# Patient Record
Sex: Female | Born: 1950 | Race: White | Hispanic: No | Marital: Married | Smoking: Never smoker
Health system: Southern US, Community
[De-identification: ages and names within clinical notes are randomized; demographics above are authoritative.]

## PROBLEM LIST (undated history)

## (undated) DIAGNOSIS — Z9889 Other specified postprocedural states: Secondary | ICD-10-CM

## (undated) DIAGNOSIS — R112 Nausea with vomiting, unspecified: Secondary | ICD-10-CM

## (undated) DIAGNOSIS — J40 Bronchitis, not specified as acute or chronic: Secondary | ICD-10-CM

## (undated) DIAGNOSIS — J329 Chronic sinusitis, unspecified: Secondary | ICD-10-CM

## (undated) DIAGNOSIS — R0602 Shortness of breath: Secondary | ICD-10-CM

## (undated) DIAGNOSIS — J189 Pneumonia, unspecified organism: Secondary | ICD-10-CM

## (undated) DIAGNOSIS — R42 Dizziness and giddiness: Secondary | ICD-10-CM

## (undated) DIAGNOSIS — Z789 Other specified health status: Secondary | ICD-10-CM

## (undated) DIAGNOSIS — H409 Unspecified glaucoma: Secondary | ICD-10-CM

## (undated) DIAGNOSIS — C50919 Malignant neoplasm of unspecified site of unspecified female breast: Principal | ICD-10-CM

## (undated) DIAGNOSIS — Z923 Personal history of irradiation: Secondary | ICD-10-CM

## (undated) DIAGNOSIS — R51 Headache: Secondary | ICD-10-CM

## (undated) DIAGNOSIS — K219 Gastro-esophageal reflux disease without esophagitis: Secondary | ICD-10-CM

## (undated) HISTORY — DX: Other specified health status: Z78.9

## (undated) HISTORY — PX: OTHER SURGICAL HISTORY: SHX169

## (undated) HISTORY — DX: Malignant neoplasm of unspecified site of unspecified female breast: C50.919

## (undated) HISTORY — DX: Chronic sinusitis, unspecified: J32.9

## (undated) HISTORY — PX: EYE SURGERY: SHX253

---

## 1997-09-05 ENCOUNTER — Observation Stay (HOSPITAL_COMMUNITY): Admission: RE | Admit: 1997-09-05 | Discharge: 1997-09-06 | Payer: Self-pay | Admitting: *Deleted

## 1998-07-30 ENCOUNTER — Other Ambulatory Visit: Admission: RE | Admit: 1998-07-30 | Discharge: 1998-07-30 | Payer: Self-pay | Admitting: *Deleted

## 1999-05-09 ENCOUNTER — Encounter: Payer: Self-pay | Admitting: Urology

## 1999-05-09 ENCOUNTER — Encounter: Admission: RE | Admit: 1999-05-09 | Discharge: 1999-05-09 | Payer: Self-pay | Admitting: Urology

## 1999-05-19 HISTORY — PX: OOPHORECTOMY: SHX86

## 1999-08-14 ENCOUNTER — Other Ambulatory Visit: Admission: RE | Admit: 1999-08-14 | Discharge: 1999-08-14 | Payer: Self-pay | Admitting: *Deleted

## 1999-09-17 ENCOUNTER — Encounter: Admission: RE | Admit: 1999-09-17 | Discharge: 1999-09-17 | Payer: Self-pay | Admitting: *Deleted

## 1999-09-17 ENCOUNTER — Encounter: Payer: Self-pay | Admitting: *Deleted

## 1999-09-26 ENCOUNTER — Encounter: Payer: Self-pay | Admitting: *Deleted

## 1999-09-26 ENCOUNTER — Encounter: Admission: RE | Admit: 1999-09-26 | Discharge: 1999-09-26 | Payer: Self-pay | Admitting: *Deleted

## 2000-11-02 ENCOUNTER — Other Ambulatory Visit: Admission: RE | Admit: 2000-11-02 | Discharge: 2000-11-02 | Payer: Self-pay | Admitting: Obstetrics and Gynecology

## 2001-04-28 ENCOUNTER — Encounter: Payer: Self-pay | Admitting: Obstetrics and Gynecology

## 2001-04-28 ENCOUNTER — Encounter: Admission: RE | Admit: 2001-04-28 | Discharge: 2001-04-28 | Payer: Self-pay | Admitting: Obstetrics and Gynecology

## 2002-01-10 ENCOUNTER — Other Ambulatory Visit: Admission: RE | Admit: 2002-01-10 | Discharge: 2002-01-10 | Payer: Self-pay | Admitting: Obstetrics and Gynecology

## 2003-03-28 ENCOUNTER — Encounter: Admission: RE | Admit: 2003-03-28 | Discharge: 2003-03-28 | Payer: Self-pay | Admitting: Obstetrics and Gynecology

## 2003-04-24 ENCOUNTER — Other Ambulatory Visit: Admission: RE | Admit: 2003-04-24 | Discharge: 2003-04-24 | Payer: Self-pay | Admitting: Obstetrics and Gynecology

## 2005-04-15 ENCOUNTER — Encounter: Admission: RE | Admit: 2005-04-15 | Discharge: 2005-04-15 | Payer: Self-pay | Admitting: Obstetrics and Gynecology

## 2005-04-29 ENCOUNTER — Encounter: Admission: RE | Admit: 2005-04-29 | Discharge: 2005-04-29 | Payer: Self-pay | Admitting: Obstetrics and Gynecology

## 2006-06-30 ENCOUNTER — Encounter (INDEPENDENT_AMBULATORY_CARE_PROVIDER_SITE_OTHER): Payer: Self-pay | Admitting: Specialist

## 2006-06-30 ENCOUNTER — Ambulatory Visit (HOSPITAL_BASED_OUTPATIENT_CLINIC_OR_DEPARTMENT_OTHER): Admission: RE | Admit: 2006-06-30 | Discharge: 2006-06-30 | Payer: Self-pay | Admitting: Obstetrics and Gynecology

## 2006-12-17 HISTORY — PX: NOSE SURGERY: SHX723

## 2007-04-26 ENCOUNTER — Encounter: Admission: RE | Admit: 2007-04-26 | Discharge: 2007-04-26 | Payer: Self-pay | Admitting: Obstetrics and Gynecology

## 2008-04-16 ENCOUNTER — Encounter: Admission: RE | Admit: 2008-04-16 | Discharge: 2008-04-16 | Payer: Self-pay | Admitting: Family Medicine

## 2008-07-25 ENCOUNTER — Encounter: Admission: RE | Admit: 2008-07-25 | Discharge: 2008-07-25 | Payer: Self-pay | Admitting: Obstetrics and Gynecology

## 2009-04-08 ENCOUNTER — Emergency Department (HOSPITAL_BASED_OUTPATIENT_CLINIC_OR_DEPARTMENT_OTHER): Admission: EM | Admit: 2009-04-08 | Discharge: 2009-04-08 | Payer: Self-pay | Admitting: Emergency Medicine

## 2009-05-18 DIAGNOSIS — Z923 Personal history of irradiation: Secondary | ICD-10-CM

## 2009-05-18 HISTORY — DX: Personal history of irradiation: Z92.3

## 2009-08-13 ENCOUNTER — Encounter: Admission: RE | Admit: 2009-08-13 | Discharge: 2009-08-13 | Payer: Self-pay | Admitting: Obstetrics and Gynecology

## 2009-08-16 HISTORY — PX: BREAST LUMPECTOMY: SHX2

## 2009-08-20 ENCOUNTER — Encounter: Admission: RE | Admit: 2009-08-20 | Discharge: 2009-08-20 | Payer: Self-pay | Admitting: Obstetrics and Gynecology

## 2009-08-27 ENCOUNTER — Encounter: Admission: RE | Admit: 2009-08-27 | Discharge: 2009-08-27 | Payer: Self-pay | Admitting: Obstetrics and Gynecology

## 2009-09-05 ENCOUNTER — Encounter: Admission: RE | Admit: 2009-09-05 | Discharge: 2009-09-05 | Payer: Self-pay | Admitting: General Surgery

## 2009-09-10 ENCOUNTER — Ambulatory Visit (HOSPITAL_BASED_OUTPATIENT_CLINIC_OR_DEPARTMENT_OTHER): Admission: RE | Admit: 2009-09-10 | Discharge: 2009-09-10 | Payer: Self-pay | Admitting: General Surgery

## 2009-09-10 ENCOUNTER — Encounter: Admission: RE | Admit: 2009-09-10 | Discharge: 2009-09-10 | Payer: Self-pay | Admitting: General Surgery

## 2009-09-24 ENCOUNTER — Ambulatory Visit: Payer: Self-pay | Admitting: Oncology

## 2009-10-07 ENCOUNTER — Ambulatory Visit: Admission: RE | Admit: 2009-10-07 | Discharge: 2009-12-06 | Payer: Self-pay | Admitting: Radiation Oncology

## 2009-10-09 ENCOUNTER — Ambulatory Visit (HOSPITAL_COMMUNITY): Admission: RE | Admit: 2009-10-09 | Discharge: 2009-10-09 | Payer: Self-pay | Admitting: Oncology

## 2009-11-21 ENCOUNTER — Ambulatory Visit (HOSPITAL_COMMUNITY): Admission: RE | Admit: 2009-11-21 | Discharge: 2009-11-21 | Payer: Self-pay | Admitting: Oncology

## 2009-12-10 ENCOUNTER — Ambulatory Visit: Payer: Self-pay | Admitting: Oncology

## 2009-12-12 LAB — CBC WITH DIFFERENTIAL/PLATELET
BASO%: 0.8 % (ref 0.0–2.0)
Basophils Absolute: 0 10*3/uL (ref 0.0–0.1)
EOS%: 5.7 % (ref 0.0–7.0)
HGB: 14.2 g/dL (ref 11.6–15.9)
MCH: 31.9 pg (ref 25.1–34.0)
RDW: 12.4 % (ref 11.2–14.5)
lymph#: 0.6 10*3/uL — ABNORMAL LOW (ref 0.9–3.3)

## 2010-05-09 ENCOUNTER — Ambulatory Visit: Payer: Self-pay | Admitting: Oncology

## 2010-05-14 LAB — CBC WITH DIFFERENTIAL/PLATELET
Basophils Absolute: 0 10*3/uL (ref 0.0–0.1)
Eosinophils Absolute: 0.3 10*3/uL (ref 0.0–0.5)
HGB: 14.1 g/dL (ref 11.6–15.9)
MCV: 91.2 fL (ref 79.5–101.0)
MONO#: 0.4 10*3/uL (ref 0.1–0.9)
NEUT#: 2.7 10*3/uL (ref 1.5–6.5)
RDW: 12.7 % (ref 11.2–14.5)
WBC: 4.2 10*3/uL (ref 3.9–10.3)
lymph#: 0.8 10*3/uL — ABNORMAL LOW (ref 0.9–3.3)

## 2010-05-14 LAB — COMPREHENSIVE METABOLIC PANEL
Albumin: 4 g/dL (ref 3.5–5.2)
BUN: 13 mg/dL (ref 6–23)
CO2: 32 mEq/L (ref 19–32)
Calcium: 9.3 mg/dL (ref 8.4–10.5)
Chloride: 102 mEq/L (ref 96–112)
Glucose, Bld: 107 mg/dL — ABNORMAL HIGH (ref 70–99)
Potassium: 3.9 mEq/L (ref 3.5–5.3)
Total Protein: 6.7 g/dL (ref 6.0–8.3)

## 2010-06-30 ENCOUNTER — Other Ambulatory Visit: Payer: Self-pay | Admitting: Oncology

## 2010-06-30 DIAGNOSIS — Z1231 Encounter for screening mammogram for malignant neoplasm of breast: Secondary | ICD-10-CM

## 2010-06-30 DIAGNOSIS — Z9889 Other specified postprocedural states: Secondary | ICD-10-CM

## 2010-08-05 LAB — DIFFERENTIAL
Eosinophils Absolute: 0.1 10*3/uL (ref 0.0–0.7)
Eosinophils Relative: 3 % (ref 0–5)
Lymphocytes Relative: 22 % (ref 12–46)
Lymphs Abs: 0.9 10*3/uL (ref 0.7–4.0)
Monocytes Absolute: 0.3 10*3/uL (ref 0.1–1.0)

## 2010-08-05 LAB — COMPREHENSIVE METABOLIC PANEL
ALT: 51 U/L — ABNORMAL HIGH (ref 0–35)
AST: 31 U/L (ref 0–37)
Albumin: 4.1 g/dL (ref 3.5–5.2)
CO2: 28 mEq/L (ref 19–32)
Calcium: 8.8 mg/dL (ref 8.4–10.5)
Chloride: 100 mEq/L (ref 96–112)
GFR calc Af Amer: >60 mL/min (ref >60)
GFR calc non Af Amer: >60 mL/min (ref >60)
Sodium: 135 mEq/L (ref 135–145)

## 2010-08-05 LAB — CBC
MCHC: 35.2 g/dL (ref 30.0–36.0)
Platelets: 264 10*3/uL (ref 150–400)
RBC: 4.5 MIL/uL (ref 3.87–5.11)
WBC: 4.2 10*3/uL (ref 4.0–10.5)

## 2010-08-19 ENCOUNTER — Ambulatory Visit
Admission: RE | Admit: 2010-08-19 | Discharge: 2010-08-19 | Disposition: A | Discharge status: Home / Self Care | Source: Ambulatory Visit | Attending: Oncology | Admitting: Oncology

## 2010-08-19 DIAGNOSIS — Z9889 Other specified postprocedural states: Secondary | ICD-10-CM

## 2010-10-03 NOTE — Op Note (Signed)
[name], [name]                  ACCOUNT NO.:  [id number]   MEDICAL RECORD NO.:  [id number]          PATIENT TYPE:  AMB   LOCATION:  [facility]                         FACILITY:  [facility]   PHYSICIAN:  Sherry A. Dickstein, M.D.DATE OF BIRTH:  1950-10-24   DATE OF PROCEDURE:  06/30/2006  DATE OF DISCHARGE:                               OPERATIVE REPORT   PREOPERATIVE DIAGNOSIS:  Endometrial polyp.   POSTOPERATIVE DIAGNOSIS:  Endometrial polyp.   OPERATION/PROCEDURE:  Dilatation and curettage hysteroscopy with  resectoscope.   SURGEON:  Sherry A. Rosalio Macadamia, M.D.   ANESTHESIA:  MAC.   INDICATIONS:  This is a 60 year old, gravida 1, para 1-0-0-1 woman who  has had two episodes of postmenopausal bleeding.  The patient had an  ultrasound which revealed a thickened endometrium, then underwent a  sonohysterogram which showed an endometrium polyp. Because of the  presence of the endometrial polyp, the patient is here for [facility]  hysteroscopy with resectoscope.   FINDINGS:  Normal sized anteflexed uterus with a long endometrial polyp  present.   DESCRIPTION OF PROCEDURE:  The patient is brought into the operating  room and given adequate IV sedation.  She  placed in the dorsal  lithotomy position.  Her perineum was washed with Betadine.  Pelvic  examination was performed.  Surgeon's gown and gloves were changed.  The  patient was draped in a sterile fashion.  Speculum was placed in the  vagina.  Vagina was washed with Betadine.  Paracervical block was  administered with 1% Nesacaine.  Anterolateral was grasped with the  single-tooth tenaculum.  Cervix was sounded.  Cervix was dilated with  Pratt dilators to a #31.  Hysteroscope was introduced into the  endometrial cavity.  Pictures were obtained. Using double loop, right  angle resector, the endometrial polyp was removed.  The base of the  polyp was then resected and some endometrial tissue was resected as  well.  Adequate hemostasis was  present.  All instruments were removed  from the vagina.  The patient was taken out of the dorsal lithotomy  position.  Complications were none.   SPECIMENS:  Endometrial polyp with resections.   IV FLUIDS:  Sorbitol differential was minus 20 mL.      Sherry A. Rosalio Macadamia, M.D.  Electronically Signed     SAD/MEDQ  D:  06/30/2006  T:  06/30/2006  Job:  [id number]

## 2010-11-11 ENCOUNTER — Other Ambulatory Visit: Payer: Self-pay | Admitting: Oncology

## 2010-11-11 ENCOUNTER — Encounter (HOSPITAL_BASED_OUTPATIENT_CLINIC_OR_DEPARTMENT_OTHER): Admitting: Oncology

## 2010-11-11 DIAGNOSIS — C50419 Malignant neoplasm of upper-outer quadrant of unspecified female breast: Secondary | ICD-10-CM

## 2010-11-11 DIAGNOSIS — Z170 Estrogen receptor positive status [ER+]: Secondary | ICD-10-CM

## 2010-11-11 LAB — CBC WITH DIFFERENTIAL/PLATELET
BASO%: 0.7 % (ref 0.0–2.0)
Basophils Absolute: 0 10*3/uL (ref 0.0–0.1)
EOS%: 11.8 % — ABNORMAL HIGH (ref 0.0–7.0)
MCH: 31.6 pg (ref 25.1–34.0)
MCHC: 34.3 g/dL (ref 31.5–36.0)
MCV: 92.3 fL (ref 79.5–101.0)
MONO%: 7.2 % (ref 0.0–14.0)
RBC: 4.33 10*6/uL (ref 3.70–5.45)
RDW: 12.3 % (ref 11.2–14.5)
lymph#: 1 10*3/uL (ref 0.9–3.3)

## 2010-11-11 LAB — COMPREHENSIVE METABOLIC PANEL
ALT: 17 U/L (ref 0–35)
AST: 22 U/L (ref 0–37)
Albumin: 3.9 g/dL (ref 3.5–5.2)
Alkaline Phosphatase: 128 U/L — ABNORMAL HIGH (ref 39–117)
BUN: 13 mg/dL (ref 6–23)
Chloride: 98 mEq/L (ref 96–112)
Potassium: 3.7 mEq/L (ref 3.5–5.3)
Sodium: 138 mEq/L (ref 135–145)

## 2010-12-02 ENCOUNTER — Other Ambulatory Visit: Payer: Self-pay | Admitting: Pulmonary Disease

## 2010-12-02 ENCOUNTER — Ambulatory Visit
Admission: RE | Admit: 2010-12-02 | Discharge: 2010-12-02 | Disposition: A | Discharge status: Home / Self Care | Source: Ambulatory Visit | Attending: Pulmonary Disease | Admitting: Pulmonary Disease

## 2010-12-02 DIAGNOSIS — J449 Chronic obstructive pulmonary disease, unspecified: Secondary | ICD-10-CM

## 2011-03-05 ENCOUNTER — Encounter (HOSPITAL_BASED_OUTPATIENT_CLINIC_OR_DEPARTMENT_OTHER): Admitting: Oncology

## 2011-03-05 ENCOUNTER — Other Ambulatory Visit: Payer: Self-pay | Admitting: Oncology

## 2011-03-05 DIAGNOSIS — Z170 Estrogen receptor positive status [ER+]: Secondary | ICD-10-CM

## 2011-03-05 DIAGNOSIS — C50419 Malignant neoplasm of upper-outer quadrant of unspecified female breast: Secondary | ICD-10-CM

## 2011-03-05 LAB — CBC WITH DIFFERENTIAL/PLATELET
EOS%: 6.3 % (ref 0.0–7.0)
Eosinophils Absolute: 0.3 10*3/uL (ref 0.0–0.5)
MCV: 90.5 fL (ref 79.5–101.0)
MONO%: 10.1 % (ref 0.0–14.0)
NEUT#: 2.6 10*3/uL (ref 1.5–6.5)
RBC: 4.23 10*6/uL (ref 3.70–5.45)
RDW: 12.1 % (ref 11.2–14.5)

## 2011-03-05 LAB — COMPREHENSIVE METABOLIC PANEL
ALT: 21 U/L (ref 0–35)
AST: 27 U/L (ref 0–37)
Albumin: 4.3 g/dL (ref 3.5–5.2)
Alkaline Phosphatase: 120 U/L — ABNORMAL HIGH (ref 39–117)
Glucose, Bld: 110 mg/dL — ABNORMAL HIGH (ref 70–99)
Potassium: 3.7 mEq/L (ref 3.5–5.3)
Sodium: 139 mEq/L (ref 135–145)
Total Protein: 6.7 g/dL (ref 6.0–8.3)

## 2011-03-24 ENCOUNTER — Telehealth: Payer: Self-pay | Admitting: *Deleted

## 2011-03-24 ENCOUNTER — Ambulatory Visit (HOSPITAL_BASED_OUTPATIENT_CLINIC_OR_DEPARTMENT_OTHER): Admitting: Physician Assistant

## 2011-03-24 ENCOUNTER — Telehealth: Payer: Self-pay | Admitting: Physician Assistant

## 2011-03-24 ENCOUNTER — Other Ambulatory Visit: Payer: Self-pay | Admitting: Physician Assistant

## 2011-03-24 ENCOUNTER — Encounter: Payer: Self-pay | Admitting: Physician Assistant

## 2011-03-24 ENCOUNTER — Ambulatory Visit (HOSPITAL_COMMUNITY)
Admission: RE | Admit: 2011-03-24 | Discharge: 2011-03-24 | Disposition: A | Discharge status: Home / Self Care | Source: Ambulatory Visit | Attending: Physician Assistant | Admitting: Physician Assistant

## 2011-03-24 VITALS — BP 145/89 | HR 88 | Temp 97.8°F | Ht 62.5 in | Wt 158.8 lb

## 2011-03-24 DIAGNOSIS — C50919 Malignant neoplasm of unspecified site of unspecified female breast: Secondary | ICD-10-CM

## 2011-03-24 DIAGNOSIS — C50019 Malignant neoplasm of nipple and areola, unspecified female breast: Secondary | ICD-10-CM

## 2011-03-24 DIAGNOSIS — R0602 Shortness of breath: Secondary | ICD-10-CM | POA: Insufficient documentation

## 2011-03-24 DIAGNOSIS — Z853 Personal history of malignant neoplasm of breast: Secondary | ICD-10-CM | POA: Insufficient documentation

## 2011-03-24 DIAGNOSIS — R05 Cough: Secondary | ICD-10-CM | POA: Insufficient documentation

## 2011-03-24 DIAGNOSIS — Z170 Estrogen receptor positive status [ER+]: Secondary | ICD-10-CM

## 2011-03-24 DIAGNOSIS — J069 Acute upper respiratory infection, unspecified: Secondary | ICD-10-CM

## 2011-03-24 DIAGNOSIS — R059 Cough, unspecified: Secondary | ICD-10-CM | POA: Insufficient documentation

## 2011-03-24 HISTORY — DX: Malignant neoplasm of unspecified site of unspecified female breast: C50.919

## 2011-03-24 MED ORDER — MOXIFLOXACIN HCL 400 MG PO TABS: 400.0000 mg | ORAL_TABLET | Freq: Every day | ORAL | Status: AC

## 2011-03-24 MED ORDER — CLONIDINE HCL 0.1 MG PO TABS: 0.1000 mg | ORAL_TABLET | Freq: Every evening | ORAL | Status: DC | PRN

## 2011-03-24 MED ORDER — CLONIDINE HCL 0.1 MG PO TABS: 0.1000 mg | ORAL_TABLET | Freq: Two times a day (BID) | ORAL | Status: DC

## 2011-03-24 NOTE — Progress Notes (Signed)
Hematology and Oncology Follow Up Visit  [name] [id number] [birth year] 60 y.o. 03/24/2011 2:53 PM   Interim History:  Patient returns today for followup of her left breast carcinoma. Interval history is remarkable for patient having been diagnosed for a "bronchitis". She has not been treated with antibiotics but was placed on an inhaler by her PCP. She continues however to have increased shortness of breath and continues to have a cough productive of yellow phlegm. Fortunately she has had no fevers chills or night sweats but does have some mild fatigue. She denies any pleurisy he has had no hemoptysis. She does take Claritin as needed. She also utilizes Nasonex nose spray and of course her an inhaler as noted above.  Interim history is also remarkable for the patient having discontinued Effexor XR. She is now taking only clonidine at night for her hot flashes and finds this. Effective. She and her gynecologist, Dr. Cherly Duran, were both concerned about the patient's weight gain while on the Effexor. She has been tapering off of the Effexor very slowly and has tolerated the taper well. She is now taking Effexor every other day, and will complete the taper in the next week or 2. Again she continues to have hot flashes but finds that these have actually improved since discontinuing the Effexor and beginning on clonidine which she now takes regularly.  Otherwise a detailed review of systems is noncontributory and interval history is unremarkable. [name] continues on her tamoxifen at 20 mg daily which she tolerates well, with no evidence of abnormal clotting, vaginal dryness, discharge, or bleeding, and no change in vision. 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  Review of Systems: Constitutional:  no weight loss, fever, night sweats Eyes: negative  WUJ:WJXBJ congestion and nasal discharge Cardiovascular: no chest pain or dyspnea on exertion Respiratory: positive for - cough,  shortness of breath, sputum changes and wheezing Neurological: negative Dermatological: negative Gastrointestinal: no abdominal pain, change in bowel habits, or black or bloody stools Genito-Urinary: no dysuria, trouble voiding, or hematuria Hematological and Lymphatic: negative Breast: negative Musculoskeletal: joint pain, unchanged Remaining ROS negative.  Medications: I have reviewed the patient's current medications.  Allergies:  Allergies  Allergen Reactions  . Amoxicillin-Pot Clavulanate Nausea And Vomiting  . Doxycycline Nausea And Vomiting     Physical Exam: Blood pressure 145/89, pulse 88, temperature 97.8 F (36.6 C), height 5' 2.5" (1.588 m), weight 158 lb 12.8 oz (72.031 kg). HEENT:  Sclerae anicteric, conjunctivae pink.  Oropharynx clear.  No mucositis or candidiasis.  Nodes:  No cervical, supraclavicular, or axillary lymphadenopathy palpated.  Breast Exam:  Right breast is benign.  No masses, discharge, skin change, or nipple inversion.  Left breast is status post lumpectomy with no suspicious masses, discharge, skin change, or nipple inversion.  Lungs:  Diffuse expiratory wheezes. Otherwise clear to auscultation bilaterally.  No crackles or rhonchi.  Heart:  Regular rate and rhythm.  Abdomen:  Soft, nontender.  Positive bowel sounds.  No organomegaly or masses palpated.  Musculoskeletal:  No focal spinal tenderness to palpation.  Extremities:  Benign.  No peripheral edema or cyanosis.  Skin:  Benign.  Neuro:  Nonfocal.   Lab Results: Lab Results  Component Value Date   WBC 4.2 09/05/2009   HGB 13.4 03/05/2011   HCT 38.3 03/05/2011   MCV 90.5 03/05/2011   PLT 260 03/05/2011     Chemistry      Component Value Date/Time   NA 139 03/05/2011  1409   K 3.7 03/05/2011 1409   CL 103 03/05/2011 1409   CO2 25 03/05/2011 1409   BUN 13 03/05/2011 1409   CREATININE 1.00 03/05/2011 1409      Component Value Date/Time   CALCIUM 9.1 03/05/2011 1409   ALKPHOS 120*  03/05/2011 1409   AST 27 03/05/2011 1409   ALT 21 03/05/2011 1409   BILITOT 0.3 03/05/2011 1409      Films and Studies: A chest x-ray was obtained this afternoon which showed an unchanged cardiac silhouette and mediastinal contours. No pleural effusion or pneumothorax. No acute cardiopulmonary disease noted.  Impression and Plan: 60 year old [name] woman status post left lumpectomy and sentinel lymph node dissection in April of 2011 for a T1CN0, grade 1 invasive ductal carcinoma. Tumor was strongly ER and PR positive, HER-2/neu negative, with a borderline proliferation fraction and are normal preoperative CA 2729. Status post radiation therapy, completed July of 2011 at which time she began on tamoxifen, 20 mg daily, continuing now with good tolerance.  With regards to her breast cancer, the patient seems to be doing well and will continue on the tamoxifen daily as before. I have refilled her clonidine which she takes at night for hot flashes.   As noted above a chest x-ray obtained this afternoon showed no occult cardiopulmonary disease. The patient will like a referral to a pulmonologist, and we will refer her per her request to [name]. In the meanwhile, I've given her a prescription for Avelox, 400 mg daily for 7 days. The patient has taken Avelox effectively in the past for similar illnesses and upper respiratory infections. I have asked her to contact us if she is not completely better within 10-14 days. we could certainly consider repeating a chest CT sooner than April if necessary. Otherwise she'll have restaging chest CT and PET scan in April, followed by a routine visit with Dr. Darnelle Duran. April will be her 2 year anniversary from surgery. All of this was reviewed in detail with the patient who voices understanding and agreement.  Spent more than half the time coordinating care.    Catherine Pizzuto, PA-C 11/6/20122:53 PM

## 2011-03-24 NOTE — Telephone Encounter (Signed)
GAVE PATIENT APPOINTMENTS FOR 08-2011 ALONG WITH SCAN APPOINTMENT

## 2011-03-24 NOTE — Telephone Encounter (Signed)
This patient was seen in the office earlier today. We ordered a chest x-ray and I have contacted the patient per her request with these results. She voiced her understanding.

## 2011-04-01 ENCOUNTER — Telehealth: Payer: Self-pay | Admitting: *Deleted

## 2011-04-01 MED ORDER — VENLAFAXINE HCL 75 MG PO TABS: 75.0000 mg | ORAL_TABLET | Freq: Every day | ORAL | Status: DC

## 2011-04-01 MED ORDER — CLONIDINE HCL 0.1 MG/24HR TD PTWK: 1.0000 | MEDICATED_PATCH | TRANSDERMAL | Status: DC
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
## 2011-04-01 NOTE — Telephone Encounter (Signed)
Pt left message stating concern of increase of hot flashes since discontinueing the effexor. " I cannot really explain how bad this is "  " I have had to change my clothes 3 times in one night "  Per message pt states she is now on the clonidine which states she may take 1 tab twice a day but she has concerns with increasing the dose.  Pt states she has also started on black cohosh.  This RN returned call / obtained vm per mobile / message left. Per work number - identified vm also obtained and message left to return call.

## 2011-04-14 ENCOUNTER — Telehealth: Payer: Self-pay | Admitting: *Deleted
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
## 2011-04-14 NOTE — Telephone Encounter (Signed)
Message left by pt requesting a return call " it's not an emergency except to me it is ".  Return call number left.  This RN returned call and received identified VM. Message left to return call and if needed to speak with a triage nurse.

## 2011-04-17 ENCOUNTER — Telehealth: Payer: Self-pay

## 2011-04-17 NOTE — Telephone Encounter (Signed)
Received message from pt she is going to see attorney Lolita Patella in [place] this afternoon, and she would like to know about the letter for her.  Called her back [phone] and informed her letter is here.  Pt states she will pick up Monday or Tuesday.

## 2011-04-28 ENCOUNTER — Telehealth: Payer: Self-pay | Admitting: Internal Medicine

## 2011-04-28 ENCOUNTER — Encounter: Payer: Self-pay | Admitting: Internal Medicine

## 2011-04-28 ENCOUNTER — Ambulatory Visit (INDEPENDENT_AMBULATORY_CARE_PROVIDER_SITE_OTHER): Admitting: Internal Medicine

## 2011-04-28 VITALS — BP 138/82 | HR 83 | Ht 63.0 in | Wt 162.2 lb

## 2011-04-28 DIAGNOSIS — R059 Cough, unspecified: Secondary | ICD-10-CM

## 2011-04-28 DIAGNOSIS — Z23 Encounter for immunization: Secondary | ICD-10-CM

## 2011-04-28 DIAGNOSIS — J3089 Other allergic rhinitis: Secondary | ICD-10-CM

## 2011-04-28 DIAGNOSIS — J42 Unspecified chronic bronchitis: Secondary | ICD-10-CM

## 2011-04-28 DIAGNOSIS — R05 Cough: Secondary | ICD-10-CM

## 2011-04-28 MED ORDER — MONTELUKAST SODIUM 10 MG PO TABS: 10.0000 mg | ORAL_TABLET | Freq: Every day | ORAL | Status: DC

## 2011-04-28 MED ORDER — CETIRIZINE HCL 10 MG PO TABS: 10.0000 mg | ORAL_TABLET | Freq: Every day | ORAL | Status: DC

## 2011-04-28 NOTE — Telephone Encounter (Signed)
Spoke with patient-aware that allergy skin testing results have not been faxed over to [place] yet but [facility] most likely will not require new testing so soon.

## 2011-04-28 NOTE — Progress Notes (Signed)
04/28/11- 60 yoF never smoker with a long history of allergic rhinitis, sinusitis and urticaria. Primary care is through [facility].[name] is her oncologist treating breast cancer with history of left lumpectomy, radiation and tamoxifen. She has had a past history of pneumonia, a diagnosis of asthma and bronchitis noted after treatment for the cancer. She has not had either flu vaccine or pneumonia vaccine. She had been treated by [name] over many years for her allergy complaints and had been on allergy vaccine several times. She was to begin again in November of 2010 but apparently did not do so. With his retirement, she is seeking to change practice. She is complaining of fullness and popping in her ears, heavy sensation in the chest but not much itch, sneeze or drainage. Nasal symptoms are usually perennial, worse in the fall and winter with mold exposure. She has had education on avoidance and control of dust and environmental allergens.  She was  skin tested at age 43 because of sinusitis then. She has given her allergy vaccine or had her husband give it, over many years. They also give her husband's insulin injections. Nasal surgery with septoplasty by Dr. Annalee Genta in 2008. Chest x-ray 03/24/2011 no active disease.  ROS-see HPI Constitutional:   No-   weight loss, night sweats, fevers, chills, fatigue, lassitude. HEENT:   No-  headaches, difficulty swallowing, tooth/dental problems, sore throat,       No-  sneezing, itching, ear ache, nasal congestion, post nasal drip,  CV:  No-   chest pain, orthopnea, PND, swelling in lower extremities, anasarca,                                  dizziness, palpitations Resp: No-   shortness of breath with exertion or at rest.              No-   productive cough,  No non-productive cough,  No- coughing up of blood.              No-   change in color of mucus.  No- wheezing.   Skin: No-   rash or lesions. GI:  No-   heartburn,  indigestion, abdominal pain, nausea, vomiting, diarrhea,                 change in bowel habits, loss of appetite GU: No-   dysuria, change in color of urine, no urgency or frequency.  No- flank pain. MS:  No-   joint pain or swelling.  No- decreased range of motion.  No- back pain. Neuro-     nothing unusual Psych:  No- change in mood or affect. No depression or anxiety.  No memory loss.  OBJ General- Alert, Oriented, Affect-appropriate, Distress- none acute Skin- rash-none, lesions- none, excoriation- none Lymphadenopathy- none Head- atraumatic            Eyes- Gross vision intact, PERRLA, conjunctivae clear secretions            Ears- Hearing, canals-normal            Nose- turbinate edema, no-Septal dev, mucus, polyps, erosion, perforation             Throat- Mallampati II , mucosa clear , drainage- none, tonsils- atrophic Neck- flexible , trachea midline, no stridor , thyroid nl, carotid no bruit Chest - symmetrical excursion , unlabored  Heart/CV- RRR , no murmur , no gallop  , no rub, nl s1 s2                           - JVD- none , edema- none, stasis changes- none, varices- none           Lung- few rhonchi right base, unlabored, wheeze- none, cough- none , dullness-none, rub- none           Chest wall-  Abd- tender-no, distended-no, bowel sounds-present, HSM- no Br/ Gen/ Rectal- Not done, not indicated Extrem- cyanosis- none, clubbing, none, atrophy- none, strength- nl Neuro- grossly intact to observation

## 2011-04-28 NOTE — Patient Instructions (Signed)
Pneumovax  Flu vax   Scripts for Zyrtec and for singulair  Please see if Dr Blossom Hoops office can provide a most recent allergy skin test result sheet  Sample Symbicort 160 maintenance inhaler      2 puffs and rinse mouth well, twice every day  Order- schedule PFT    Dx chronic bronchitis

## 2011-04-29 ENCOUNTER — Telehealth: Payer: Self-pay | Admitting: Internal Medicine

## 2011-04-29 ENCOUNTER — Ambulatory Visit (INDEPENDENT_AMBULATORY_CARE_PROVIDER_SITE_OTHER): Admitting: Internal Medicine

## 2011-04-29 DIAGNOSIS — R0602 Shortness of breath: Secondary | ICD-10-CM

## 2011-04-29 DIAGNOSIS — J42 Unspecified chronic bronchitis: Secondary | ICD-10-CM

## 2011-04-29 NOTE — Telephone Encounter (Signed)
CY aware of this message-will address later.

## 2011-04-29 NOTE — Progress Notes (Signed)
PFT done today. 

## 2011-04-30 NOTE — Telephone Encounter (Signed)
[name], have you spoken with patient regarding this matter? Thanks.

## 2011-04-30 NOTE — Telephone Encounter (Signed)
I have given the Allergy Lab a script to build using vaccine supplied from the [facility] office. As build-up is completed, we will convert her to our vaccine equivalents.

## 2011-05-01 ENCOUNTER — Telehealth: Payer: Self-pay | Admitting: *Deleted

## 2011-05-01 NOTE — Telephone Encounter (Signed)
CY-please advise if this is something you and the patient have discussed. Thanks.

## 2011-05-01 NOTE — Telephone Encounter (Signed)
Called pt. She is going to start her shots next week. She is determined to give her own shots. A friend of hers has seen Dr.Young for years and gives her own. Also [name] has given herself another kind of shot( Ican't remember what she told me) for years. Her husband use to give her allergy shots years ago. I explained to her Dr.Young discourages this(giving own shots) and he is the only one that can make that call. She said she would call and talk to you about it [name]. I also told her since we have an office in [place] he might offer her that as an option.

## 2011-05-02 DIAGNOSIS — J3089 Other allergic rhinitis: Secondary | ICD-10-CM | POA: Insufficient documentation

## 2011-05-02 NOTE — Assessment & Plan Note (Addendum)
This is probably a bronchitis. She has had radiologic followup by her oncologists. Plan-PFT, flu and pneumococcal vaccines as discussed. We will refill Zyrtec and Singulair. She has a rescue albuterol inhaler which she will try for current symptoms. We will add Symbicort.

## 2011-05-02 NOTE — Assessment & Plan Note (Signed)
She has a long experience with allergy vaccine is convinced she is better off when she is on it. She wants to go back to giving her own shots because it improves her compliance. I discussed policy and brisk potential for anaphylaxis. She'll look to see if she has more recent skin test records from Dr. Blossom Hoops office because she thought she was just tested last year.

## 2011-05-04 NOTE — Telephone Encounter (Signed)
Ok for her to give her own allergy shots. Needs to sign our waiver form and needs script and instruction for Epipen. Please ask T.S. If she has my script for her vaccine mix.

## 2011-05-04 NOTE — Telephone Encounter (Signed)
I will have [name] read & sign a wavier,give her an Epi-pen rx and show her how to use the Epi-pen.

## 2011-05-04 NOTE — Telephone Encounter (Signed)
Done.Refer to other ph.call. Thanks,T.Shealeigh Dunstan

## 2011-05-05 ENCOUNTER — Ambulatory Visit (INDEPENDENT_AMBULATORY_CARE_PROVIDER_SITE_OTHER)

## 2011-05-05 DIAGNOSIS — J309 Allergic rhinitis, unspecified: Secondary | ICD-10-CM

## 2011-05-15 ENCOUNTER — Encounter: Payer: Self-pay | Admitting: Internal Medicine

## 2011-06-09 ENCOUNTER — Ambulatory Visit: Admitting: Internal Medicine

## 2011-06-15 ENCOUNTER — Other Ambulatory Visit: Payer: Self-pay | Admitting: *Deleted

## 2011-06-18 MED ORDER — CLONIDINE HCL 0.1 MG/24HR TD PTWK: 1.0000 | MEDICATED_PATCH | TRANSDERMAL | Status: DC

## 2011-07-03 ENCOUNTER — Telehealth: Payer: Self-pay | Admitting: Internal Medicine

## 2011-07-03 ENCOUNTER — Other Ambulatory Visit: Payer: Self-pay | Admitting: *Deleted

## 2011-07-03 MED ORDER — MOXIFLOXACIN HCL 400 MG PO TABS: 400.0000 mg | ORAL_TABLET | Freq: Every day | ORAL | Status: AC

## 2011-07-03 MED ORDER — PREDNISONE 10 MG PO TABS: ORAL_TABLET | ORAL | Status: DC

## 2011-07-03 NOTE — Telephone Encounter (Signed)
I spoke with [name] and she c/o nasal and chest congestion, sinus pressure, PND, scratchy throat, blowing out green phlem x 1 week. Denies any fever, chills, sweats, cough. She has been doing salt water rinses and using her inhalers as directed. [name] requesting avelox and pred taper to be called in. Please advise Dr. Maple Hudson, thanks  Allergies  Allergen Reactions  . Amoxicillin-Pot Clavulanate Nausea And Vomiting  . Doxycycline Nausea And Vomiting     [facility]

## 2011-07-03 NOTE — Telephone Encounter (Signed)
Per CY-okay to give Avelox 400 mg # 7 take 1 po qd no refills and Prednisone 10 mg #20 take 4 x 2 days, 3 x 2 days, 2 x 2 days, 1 x 2 days, then stop no refills.

## 2011-07-03 NOTE — Telephone Encounter (Signed)
I spoke with pt and is aware the rx's have been called and in and is aware of the directions.

## 2011-08-13 ENCOUNTER — Ambulatory Visit (INDEPENDENT_AMBULATORY_CARE_PROVIDER_SITE_OTHER): Admitting: Internal Medicine

## 2011-08-13 ENCOUNTER — Encounter: Payer: Self-pay | Admitting: Internal Medicine

## 2011-08-13 VITALS — BP 122/66 | HR 104 | Ht 63.0 in | Wt 164.6 lb

## 2011-08-13 DIAGNOSIS — J3089 Other allergic rhinitis: Secondary | ICD-10-CM

## 2011-08-13 DIAGNOSIS — R059 Cough, unspecified: Secondary | ICD-10-CM

## 2011-08-13 DIAGNOSIS — R05 Cough: Secondary | ICD-10-CM

## 2011-08-13 DIAGNOSIS — R0602 Shortness of breath: Secondary | ICD-10-CM

## 2011-08-13 MED ORDER — BUDESONIDE-FORMOTEROL FUMARATE 160-4.5 MCG/ACT IN AERO: 2.0000 | INHALATION_SPRAY | Freq: Two times a day (BID) | RESPIRATORY_TRACT | Status: DC

## 2011-08-13 MED ORDER — DEXLANSOPRAZOLE 60 MG PO CPDR: 60.0000 mg | DELAYED_RELEASE_CAPSULE | Freq: Every day | ORAL | Status: DC

## 2011-08-13 MED ORDER — MOXIFLOXACIN HCL 400 MG PO TABS: 400.0000 mg | ORAL_TABLET | Freq: Every day | ORAL | Status: AC

## 2011-08-13 NOTE — Progress Notes (Signed)
04/28/11- 60 yoF never smoker with a long history of allergic rhinitis, sinusitis and urticaria. Primary care is through [facility].[name] is her oncologist treating breast cancer with history of left lumpectomy, radiation and tamoxifen. She has had a past history of pneumonia, a diagnosis of asthma and bronchitis noted after treatment for the cancer. She has not had either flu vaccine or pneumonia vaccine. She had been treated by [name] over many years for her allergy complaints and had been on allergy vaccine several times. She was to begin again in November of 2010 but apparently did not do so. With his retirement, she is seeking to change practice. She is complaining of fullness and popping in her ears, heavy sensation in the chest but not much itch, sneeze or drainage. Nasal symptoms are usually perennial, worse in the fall and winter with mold exposure. She has had education on avoidance and control of dust and environmental allergens.  She was  skin tested at age 82 because of sinusitis then. She has given her allergy vaccine or had her husband give it, over many years. They also give her husband's insulin injections. Nasal surgery with septoplasty by Dr. Annalee Genta in 2008. Chest x-ray 03/24/2011 no active disease.  08/13/11- 60 yoF never smoker with a long history of allergic rhinitis, asthma/ bronchitis, sinusitis and urticaria, complicated by past hx breast Ca. Cough producing thick yellow sputum. Symbicort helped but she ran out of the sample. Continues allergy vaccine based on skin testing from another office, with our vaccine made here. She says she really feels it helps, especially her head congestion. CXR 03/24/2011-no active disease, normal. PFT 04/29/2011- WNL, FEV1/FVC 0.74.  ROS-see HPI Constitutional:   No-   weight loss, night sweats, fevers, chills, fatigue, lassitude. HEENT:   No-  headaches, difficulty swallowing, tooth/dental problems, sore  throat,       No-  sneezing, itching, ear ache, nasal congestion, post nasal drip,  CV:  No-   chest pain, orthopnea, PND, swelling in lower extremities, anasarca, dizziness, palpitations Resp: No-   shortness of breath with exertion or at rest.              No-   productive cough,  No non-productive cough,  No- coughing up of blood.              No-   change in color of mucus.  Mild wheezing since out of of her med..   Skin: No-   rash or lesions. GI:  No-   heartburn, indigestion, abdominal pain, nausea, vomiting, diarrhea,                 change in bowel habits, loss of appetite GU: No-   dysuria, change in color of urine, no urgency or frequency.  No- flank pain. MS:  No-   joint pain or swelling.  No- decreased range of motion.  No- back pain. Neuro-     nothing unusual Psych:  No- change in mood or affect. No depression or anxiety.  No memory loss.  OBJ General- Alert, Oriented, Affect-appropriate, Distress- none acute Skin- rash-none, lesions- none, excoriation- none Lymphadenopathy- none Head- atraumatic            Eyes- Gross vision intact, PERRLA, conjunctivae clear secretions            Ears- Hearing, canals-normal            Nose- turbinate edema, no-Septal dev, mucus, polyps, erosion, perforation  Throat- Mallampati II , mucosa clear , drainage- none, tonsils- atrophic Neck- flexible , trachea midline, no stridor , thyroid nl, carotid no bruit Chest - symmetrical excursion , unlabored           Heart/CV- RRR , no murmur , no gallop  , no rub, nl s1 s2                           - JVD- none , edema- none, stasis changes- none, varices- none           Lung- few rhonchi right base, unlabored, + Mild wheeze and light cough ,   No-  dullness-none, rub- none           Chest wall-  Abd-  Br/ Gen/ Rectal- Not done, not indicated Extrem- cyanosis- none, clubbing, none, atrophy- none, strength- nl Neuro- grossly intact to observation

## 2011-08-13 NOTE — Patient Instructions (Addendum)
Scripts for Symbicort 160 to use as a daily maintenance long term asthma controller                   Dexilant/ Kapadex, for acid control  Check your home meds for montelukast/ Singulair  Continue allergy vaccine  Script for Avelox to hold

## 2011-08-17 ENCOUNTER — Other Ambulatory Visit (HOSPITAL_COMMUNITY)

## 2011-08-17 ENCOUNTER — Inpatient Hospital Stay (HOSPITAL_COMMUNITY): Admission: RE | Admit: 2011-08-17 | Source: Ambulatory Visit

## 2011-08-17 ENCOUNTER — Other Ambulatory Visit: Admitting: Lab

## 2011-08-18 ENCOUNTER — Telehealth: Payer: Self-pay | Admitting: Oncology

## 2011-08-18 ENCOUNTER — Other Ambulatory Visit: Payer: Self-pay | Admitting: Physician Assistant

## 2011-08-18 DIAGNOSIS — C50919 Malignant neoplasm of unspecified site of unspecified female breast: Secondary | ICD-10-CM

## 2011-08-18 NOTE — Telephone Encounter (Signed)
lmonvm adviisng the pt of her lab and md appts for april

## 2011-08-19 ENCOUNTER — Encounter: Payer: Self-pay | Admitting: Internal Medicine

## 2011-08-19 ENCOUNTER — Other Ambulatory Visit: Admitting: Lab

## 2011-08-19 NOTE — Assessment & Plan Note (Signed)
She is describing a productive chronic cough consistent with bronchitis. Plan-antibiotic to hold. Refilled Symbicort Add Dexilant while covering for possible GERD/ reflux risk

## 2011-08-19 NOTE — Assessment & Plan Note (Signed)
Feels well controlled. Continue vaccine. Continues Singulair.

## 2011-08-19 NOTE — Assessment & Plan Note (Signed)
Managing as bronchitis

## 2011-08-26 ENCOUNTER — Ambulatory Visit: Admitting: Oncology

## 2011-08-31 ENCOUNTER — Other Ambulatory Visit (HOSPITAL_BASED_OUTPATIENT_CLINIC_OR_DEPARTMENT_OTHER): Admitting: Lab

## 2011-08-31 DIAGNOSIS — C50919 Malignant neoplasm of unspecified site of unspecified female breast: Secondary | ICD-10-CM

## 2011-08-31 LAB — COMPREHENSIVE METABOLIC PANEL
ALT: 18 U/L (ref 0–35)
AST: 23 U/L (ref 0–37)
Albumin: 3.7 g/dL (ref 3.5–5.2)
Alkaline Phosphatase: 137 U/L — ABNORMAL HIGH (ref 39–117)
Calcium: 9.1 mg/dL (ref 8.4–10.5)
Chloride: 102 mEq/L (ref 96–112)
Potassium: 3.7 mEq/L (ref 3.5–5.3)
Sodium: 139 mEq/L (ref 135–145)

## 2011-08-31 LAB — CBC WITH DIFFERENTIAL/PLATELET
BASO%: 0.7 % (ref 0.0–2.0)
Eosinophils Absolute: 0.4 10*3/uL (ref 0.0–0.5)
LYMPH%: 22.9 % (ref 14.0–49.7)
MCHC: 33.6 g/dL (ref 31.5–36.0)
MONO#: 0.4 10*3/uL (ref 0.1–0.9)
NEUT#: 2.4 10*3/uL (ref 1.5–6.5)
Platelets: 235 10*3/uL (ref 145–400)
RBC: 4.38 10*6/uL (ref 3.70–5.45)
RDW: 12.7 % (ref 11.2–14.5)
WBC: 4.2 10*3/uL (ref 3.9–10.3)
lymph#: 1 10*3/uL (ref 0.9–3.3)
nRBC: 0 % (ref 0–0)

## 2011-08-31 LAB — CANCER ANTIGEN 27.29: CA 27.29: 22 U/mL (ref 0–39)

## 2011-09-01 ENCOUNTER — Encounter (HOSPITAL_COMMUNITY)
Admission: RE | Admit: 2011-09-01 | Discharge: 2011-09-01 | Disposition: A | Discharge status: Home / Self Care | Source: Ambulatory Visit | Attending: Physician Assistant | Admitting: Physician Assistant

## 2011-09-01 DIAGNOSIS — C50919 Malignant neoplasm of unspecified site of unspecified female breast: Secondary | ICD-10-CM

## 2011-09-01 LAB — GLUCOSE, CAPILLARY: Glucose-Capillary: 97 mg/dL (ref 70–99)

## 2011-09-01 MED ORDER — IOHEXOL 300 MG/ML  SOLN
80.0000 mL | Freq: Once | INTRAMUSCULAR | Status: AC | PRN
  Administered 2011-09-01: 80 mL via INTRAVENOUS

## 2011-09-01 MED ORDER — FLUDEOXYGLUCOSE F - 18 (FDG) INJECTION
15.5000 | Freq: Once | INTRAVENOUS | Status: AC | PRN
  Administered 2011-09-01: 15.5 via INTRAVENOUS

## 2011-09-09 ENCOUNTER — Telehealth: Payer: Self-pay | Admitting: Oncology

## 2011-09-09 ENCOUNTER — Ambulatory Visit (HOSPITAL_BASED_OUTPATIENT_CLINIC_OR_DEPARTMENT_OTHER): Admitting: Oncology

## 2011-09-09 VITALS — BP 138/90 | HR 93 | Temp 98.5°F | Ht 63.0 in | Wt 162.6 lb

## 2011-09-09 DIAGNOSIS — Z79810 Long term (current) use of selective estrogen receptor modulators (SERMs): Secondary | ICD-10-CM

## 2011-09-09 DIAGNOSIS — Z170 Estrogen receptor positive status [ER+]: Secondary | ICD-10-CM

## 2011-09-09 DIAGNOSIS — C50919 Malignant neoplasm of unspecified site of unspecified female breast: Secondary | ICD-10-CM

## 2011-09-09 MED ORDER — TAMOXIFEN CITRATE 20 MG PO TABS: 20.0000 mg | ORAL_TABLET | Freq: Every day | ORAL | Status: DC

## 2011-09-09 NOTE — Telephone Encounter (Signed)
gv pt appt schedule for may/oct including mammo for 5/2. lmonvm for cathy @ [facility] re appt for breast mri. Lynden Ang will contact pt w/breast mri appt. Pt aware.

## 2011-09-09 NOTE — Progress Notes (Signed)
ID: JULINE SANDERFORD   DOB: Oct 16, 1950  MR#: [id number]  WUJ#:[id number]  HISTORY OF PRESENT ILLNESS: [name] had routine screening mammography March 29th, 2011 at the [facility].  This showed a possible mass in the left breast.  She was recalled for additional studies on April 5th.  This study demonstrated a focal mass in the outer mid-portion of the left breast with minimal irregularity.  Ultrasound showed an ovoid hypoechoic mass measuring up to 8 mm.  Again, there was minimal irregularity noted but biopsy was suggested and performed the same day.  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  The proliferation marker was 19% and there was no evidence of Her-2 amplification by CISH with a ratio of 1.03.  With this information, the patient was referred to Dr. Abbey Duran and bilateral breast MRIs were obtained on August 27, 2009.  This showed an 11 millimeter mass in the central portion of the left breast correlated with the known malignancy.  There were no other areas suspicious for cancer and on April 26th, 2011, the patient underwent left axillary sentinel lymph node mapping and sampling and left lumpectomy with the final pathology from that procedure ([id number]) showing a 1.3 cm Grade 1 invasive ductal carcinoma, with negative margins, and 0 of 4 sentinel lymph nodes involved.  Her subsequent history is as detailed below  INTERVAL HISTORY: [name] returns today for follow-up of her breast cancer. The interval history is significant chiefly for her husband's complex clinical problems, felt possibly due to [employer] [place]; from her description, he seems to have severe PVD and may require major surgical intervention in the near future.  REVIEW OF SYSTEMS: She herself is gaining weight, which concerns her. She blames this on Effexor, which she is trying to taper off; this has caused  her hot flashes to increase. She sleeps poorly and describes herself as moderately fatigued. She coninues to experience sinus and breathing problems despite multiple antibiotic courses and tells me she has been diagnosed with asthma. Her job is sedentary and she feels achy when she gets up after sitting a long time. Otherwise a detailed review of systems was unremarkable.  PAST MEDICAL HISTORY: Past Medical History  Diagnosis Date  . Breast cancer 03/24/2011  . Asthma   . Allergy history unknown   . Sinusitis   . Sleep apnea   Significant for glaucoma, status post laser surgery bilaterally.  History of unilateral salpingo-oophorectomy.  She believes there was a left ovary that was removed.  History of trauma to the left knee with kneecap fracture, not requiring surgery, followed by [name].  History of anxiety disorder.  History of bilateral blepharoplasty.  History of septoplasty.  History of two tooth implants in the right maxilla.  Question of possible osteopenia.  History of benign skin biopsies.    PAST SURGICAL HISTORY: Past Surgical History  Procedure Date  . Breast lumpectomy 08-2009    left   . Nose surgery 12-2006  . Oophorectomy 2001    FAMILY HISTORY Family History  Problem Relation Age of Onset  . Breast cancer Mother   . Asthma Brother   . Emphysema Father   . Heart disease Father   The patient's father died at the age of 56 from emphysema.  He was a Psychologist, occupational.  The patient's mother is alive at age 7.  She had a myocardial infarction about 10 years ago.  The patient has two brothers.  There is no history of breast or ovarian cancer in the family.  GYNECOLOGIC HISTORY: She is GX P1.  First pregnancy to term at age 86.  She understands that increases the risk of breast cancer developing.  Last menstrual period was about 8 years ago and she took hormones for about five years.    SOCIAL HISTORY: She works as a Special educational needs teacher.  Her husband [name] is disabled  secondary to [name], diabetes, and peripheral vascular disease. Their daughter, [name], just graduated from college in nutrition. She was recently married. The patient attends the [employer] in [place].     ADVANCED DIRECTIVES:  HEALTH MAINTENANCE: History  Substance Use Topics  . Smoking status: Never Smoker   . Smokeless tobacco: Never Used  . Alcohol Use: Yes     Colonoscopy:  PAP:  Bone density: July 2011/ mild osteopenia  Lipid panel:  Allergies  Allergen Reactions  . Amoxicillin-Pot Clavulanate Nausea And Vomiting  . Doxycycline Nausea And Vomiting    Current Outpatient Prescriptions  Medication Sig Dispense Refill  . ALPRAZolam (XANAX) 0.25 MG tablet Take 0.25 mg by mouth at bedtime as needed.        Marland Kitchen aspirin 81 MG tablet Take 81 mg by mouth daily.        . Boric Acid POWD by Does not apply route. Takes one cap at night as needed       . brinzolamide (AZOPT) 1 % ophthalmic suspension Place 1 drop into both eyes 2 (two) times daily.        . budesonide-formoterol (SYMBICORT) 160-4.5 MCG/ACT inhaler Inhale 2 puffs into the lungs 2 (two) times daily. Rinse mouth  3 Inhaler  3  . cetirizine (ZYRTEC) 10 MG tablet Take 1 tablet (10 mg total) by mouth daily.  90 tablet  3  . cloNIDine (CATAPRES-TTS-1) 0.1 mg/24hr patch Place 1 patch (0.1 mg total) onto the skin once a week.  4 patch  12  . dexlansoprazole (DEXILANT) 60 MG capsule Take 1 capsule (60 mg total) by mouth daily.  90 capsule  3  . estradiol (ESTRACE) 0.1 MG/GM vaginal cream Place 2 g vaginally daily.        Marland Kitchen latanoprost (XALATAN) 0.005 % ophthalmic solution Place 1 drop into both eyes at bedtime.        . mometasone (NASONEX) 50 MCG/ACT nasal spray Place 2 sprays into the nose daily.        . montelukast (SINGULAIR) 10 MG tablet Take 1 tablet (10 mg total) by mouth at bedtime.  90 tablet  3  . tamoxifen (NOLVADEX) 20 MG tablet Take 20 mg by mouth daily.        Marland Kitchen venlafaxine (EFFEXOR) 37.5 MG  tablet Take 37.5 mg by mouth daily.      . cholecalciferol (VITAMIN D) 400 UNITS TABS Take 1,000 Units by mouth daily.        Marland Kitchen venlafaxine (EFFEXOR) 75 MG tablet Take 1 tablet (75 mg total) by mouth daily.  30 tablet  0    OBJECTIVE: middle aged white woman in no acute distress Filed Vitals:   09/09/11 0918  BP: 138/90  Pulse: 93  Temp: 98.5 F (36.9 C)     Body mass index is 28.80 kg/(m^2).    ECOG FS: 1  Sclerae unicteric Oropharynx clear No peripheral adenopathy Lungs no rales or rhonchi with good excursion bilaterally Heart regular rate and rhythm Abd benign MSK no focal  spinal tenderness, no peripheral edema Neuro: nonfocal Breasts: Right breast no suspicious findings; Left brest s/p lumpectomy, no evidence of local recurrence  LAB RESULTS: Lab Results  Component Value Date   WBC 4.2 08/31/2011   NEUTROABS 2.4 08/31/2011   HGB 13.3 08/31/2011   HCT 39.6 08/31/2011   MCV 90.4 08/31/2011   PLT 235 08/31/2011      Chemistry      Component Value Date/Time   NA 139 08/31/2011 1201   K 3.7 08/31/2011 1201   CL 102 08/31/2011 1201   CO2 30 08/31/2011 1201   BUN 13 08/31/2011 1201   CREATININE 0.82 08/31/2011 1201      Component Value Date/Time   CALCIUM 9.1 08/31/2011 1201   ALKPHOS 137* 08/31/2011 1201   AST 23 08/31/2011 1201   ALT 18 08/31/2011 1201   BILITOT 0.2* 08/31/2011 1201       Lab Results  Component Value Date   LABCA2 22 08/31/2011    No components found with this basename: [id number]    No results found for this basename: INR:1;PROTIME:1 in the last 168 hours  Urinalysis No results found for this basename: colorurine, appearanceur, labspec, phurine, glucoseu, hgbur, bilirubinur, ketonesur, proteinur, urobilinogen, nitrite, leukocytesur    STUDIES:  Mammogram 08/19/2011 shows very dense breasts.   Ct Chest W Contrast  09/01/2011  *RADIOLOGY REPORT*  Clinical Data: Left breast cancer diagnosed 2011, status post lumpectomy and radiation therapy.   Restaging.  CT CHEST WITH CONTRAST  Technique:  Multidetector CT imaging of the chest was performed following the standard protocol during bolus administration of intravenous contrast.  Contrast: 80mL OMNIPAQUE IOHEXOL 300 MG/ML  SOLN  Comparison: PET CT 09/01/2011, dictated separately  Findings: 2 mm hypodensity in the left thyroid lobe is unlikely to be clinically significant.  Heart size is normal.  No pericardial or pleural effusion.  No lymphadenopathy.  Left breast lower outer quadrant presumed lumpectomy site is identified.  Central airways are patent.  The lungs are clear.  No lytic or sclerotic osseous lesion.  IMPRESSION: No CT evidence for thoracic metastasis.  No acute abnormality.  Original Report Authenticated By: [name], M.D.   Nm Pet Image Restag (ps) Skull Base To Thigh  09/01/2011  *RADIOLOGY REPORT*  Clinical Data: Subsequent treatment strategy for breast carcinoma. 61 year old female with invasive ductal carcinoma and ductal carcinoma in situ of the left breast.  NUCLEAR MEDICINE PET SKULL BASE TO THIGH  Fasting Blood Glucose:  97  Technique:  15.5 mCi F-18 FDG was injected intravenously. CT data was obtained and used for attenuation correction and anatomic localization only.  (This was not acquired as a diagnostic CT examination.) Additional exam technical data entered on technologist worksheet.  Comparison:  None  Findings:  Neck: No hypermetabolic lymph nodes in the neck.  Chest:  There is a focus of hypermetabolic activity in the posterior left breast (image 84) likely at biopsy site.  There are hypermetabolic left axillary lymph nodes.  No hypermetabolic supraclavicular or infraclavicular nodes.  No hypermetabolic internal mammary nodes.  Review of the lung parenchyma demonstrates no suspicious pulmonary nodules.  Abdomen/Pelvis:  No abnormal hypermetabolic activity within the liver, pancreas, adrenal glands, or spleen.  No hypermetabolic lymph nodes in the abdomen or pelvis.   [place]:  No focal hypermetabolic activity to suggest skeletal metastasis.  There is a sclerotic lesion in the right iliac bone which appears benign.  IMPRESSION:  1.  No evidence of local nodal breast cancer metastasis or systemic  metastasis. 2.  Hypermetabolic activity in the posterior left breast presumably at the biopsy site.  Original Report Authenticated By: [name], M.D.    ASSESSMENT: 61 year old [place] woman status post left lumpectomy and sentinel lymph node dissection in April 2011 for a T1c N0, stage IA invasive ductal carcinoma,grade 1, strongly estrogen and progesterone receptor positive, HER2 negative with a borderline proliferation fraction and normal preoperative CA27.29.  She completed radiation therapy July 2011 and started tamoxifen at that time with good tolerance.   PLAN: Certainly there is no evidence of metastatic disease. The area of increased uptake on PET appears to correlate with her prior biopsy site, but it has been two years and it is not clear to me we should still be seeing a high SUV there. We will obtain an MRI of the breast for further evaluation. She will return to see [place] shortly thereafter to discuss results, but if we can safely exclude recurrence (this may require biopsy) we will cancel that appointment and she will see me in October for a routine visit. Otherwise the plan is to continue tamoxifen for now, discuss possibly switching to an aromatase inhibitor at the October visit.   Cera Rorke C    09/09/2011

## 2011-09-17 ENCOUNTER — Ambulatory Visit
Admission: RE | Admit: 2011-09-17 | Discharge: 2011-09-17 | Disposition: A | Discharge status: Home / Self Care | Source: Ambulatory Visit | Attending: Oncology | Admitting: Oncology

## 2011-09-17 ENCOUNTER — Other Ambulatory Visit: Payer: Self-pay | Admitting: Oncology

## 2011-09-17 DIAGNOSIS — C50919 Malignant neoplasm of unspecified site of unspecified female breast: Secondary | ICD-10-CM

## 2011-09-18 ENCOUNTER — Ambulatory Visit
Admission: RE | Admit: 2011-09-18 | Discharge: 2011-09-18 | Disposition: A | Discharge status: Home / Self Care | Source: Ambulatory Visit | Attending: Oncology | Admitting: Oncology

## 2011-09-18 DIAGNOSIS — C50919 Malignant neoplasm of unspecified site of unspecified female breast: Secondary | ICD-10-CM

## 2011-09-18 MED ORDER — GADOBENATE DIMEGLUMINE 529 MG/ML IV SOLN
14.0000 mL | Freq: Once | INTRAVENOUS | Status: AC | PRN
  Administered 2011-09-18: 14 mL via INTRAVENOUS

## 2011-09-22 ENCOUNTER — Ambulatory Visit: Admitting: Physician Assistant

## 2011-10-14 ENCOUNTER — Telehealth: Payer: Self-pay

## 2011-10-14 NOTE — Telephone Encounter (Signed)
Received message from pt stating that she is trying to get off of effexor, but needs prescription for something to replace this as an anti-depressant for her.  She also states she is taking peridin-C for her hot flashes, and wants to know can she have prescription for a 90 day supply to mail to [place] to see if they can fill this (more convenient for her).  Her return number is cell [phone] or work [phone], ext [phone].  Note to MD for review.

## 2011-10-27 ENCOUNTER — Telehealth: Payer: Self-pay | Admitting: *Deleted

## 2011-11-03 ENCOUNTER — Telehealth: Payer: Self-pay | Admitting: Family Medicine

## 2011-11-03 NOTE — Telephone Encounter (Signed)
Error.  Not our patient.

## 2011-11-12 ENCOUNTER — Telehealth: Payer: Self-pay | Admitting: Internal Medicine

## 2011-11-12 ENCOUNTER — Other Ambulatory Visit: Payer: Self-pay | Admitting: Internal Medicine

## 2011-11-12 MED ORDER — MONTELUKAST SODIUM 10 MG PO TABS: 10.0000 mg | ORAL_TABLET | Freq: Every day | ORAL | Status: DC

## 2011-11-12 NOTE — Telephone Encounter (Signed)
Spoke with [name] requesting rx for singular to be written  So she can take to the [place] and a 30 day  Supply be  [facility] [place] [facility] printed for Dr Maple Hudson to sign and [facility]. [name] is aware nothing further was needed.

## 2011-11-30 ENCOUNTER — Telehealth: Payer: Self-pay | Admitting: Internal Medicine

## 2011-11-30 MED ORDER — CEFDINIR 300 MG PO CAPS: 300.0000 mg | ORAL_CAPSULE | Freq: Two times a day (BID) | ORAL | Status: AC

## 2011-11-30 NOTE — Telephone Encounter (Signed)
Left message at pts work number tat RX has been sent and if any questions or concerns to call the office; also if no better then needs to come in for OV.

## 2011-11-30 NOTE — Telephone Encounter (Signed)
Ok for [employer] one twice daily x 10 days then ov if not 100%

## 2011-11-30 NOTE — Telephone Encounter (Signed)
Spoke with patient-states for the past week now she is having sinus drainage and ? Infection again; has changed from light yellow to dark green in color now. Pt has used Cefdinir in the past and has cleared this up-pt aware that CY out of the office until Monday and was offered appt with MD or NP here in the office; patient states she is unable to get here as she is short staffed this week at her job. MW as doc the day please advise. Thanks.

## 2011-12-02 NOTE — Telephone Encounter (Signed)
No inquiry noted

## 2012-01-11 ENCOUNTER — Telehealth: Payer: Self-pay | Admitting: Internal Medicine

## 2012-01-11 MED ORDER — PREDNISONE 10 MG PO TABS: ORAL_TABLET | ORAL | Status: DC

## 2012-01-11 MED ORDER — CEFDINIR 300 MG PO CAPS: 300.0000 mg | ORAL_CAPSULE | Freq: Two times a day (BID) | ORAL | Status: AC

## 2012-01-11 NOTE — Telephone Encounter (Signed)
Per CY-okay to give Avelox 400 mg #10 take 1 po qd no refills and take Mucinex D OTC.

## 2012-01-11 NOTE — Telephone Encounter (Signed)
Ok to d/c order for Avelox Give omnicef 300 mg, #  20, 1 twice daily

## 2012-01-11 NOTE — Telephone Encounter (Signed)
Pt called back and added the following request: She wants a rx for prednisone as well. Callmpt at work # [phone] x [phone] or cell #. [name]

## 2012-01-11 NOTE — Telephone Encounter (Signed)
Per CY ok to give prednisone 10mg  #20, take 4x2 days, then 3x2, 2x2, then 1x2days as well as the abx as stated below.

## 2012-01-11 NOTE — Telephone Encounter (Signed)
I spoke with pt and advised her of CDY recs. She stated the avelox is expensive and is wanting to know if Dr. Maple Hudson feels another round of omnicef would knock this out. Please advise Dr. Maple Hudson thanks  Allergies  Allergen Reactions  . Amoxicillin-Pot Clavulanate Nausea And Vomiting  . Doxycycline Nausea And Vomiting

## 2012-01-11 NOTE — Telephone Encounter (Signed)
Pt is also requesting an rx for prednisone. ABX rx has not be sent yet. Please advise.Carron Curie, CMA

## 2012-01-11 NOTE — Telephone Encounter (Signed)
Pt c/o struggling with sinus and chest congestion all summer.  PT took round of Omnicef back in July but still continues to have sinus drainage and prod cough with dark green mucus.  Mucus changed to yellow when on abx but quickly changed back after finishing abs.  Pt is using saline rinses and taking Zyrtec and Singulair at bedtime.  Pt has not taken allergy shot all summer because she has been sick.  Pt wanted to know if coming in for a depo shot would maybe help. Please advise. Allergies  Allergen Reactions  . Amoxicillin-Pot Clavulanate Nausea And Vomiting  . Doxycycline Nausea And Vomiting

## 2012-02-17 ENCOUNTER — Telehealth: Payer: Self-pay | Admitting: Internal Medicine

## 2012-02-17 MED ORDER — CLINDAMYCIN HCL 150 MG PO CAPS: 150.0000 mg | ORAL_CAPSULE | Freq: Four times a day (QID) | ORAL | Status: DC

## 2012-02-17 NOTE — Telephone Encounter (Signed)
Pt is aware of CDY recs. She voiced her understanding and needed nothing further. RX has been sent

## 2012-02-17 NOTE — Telephone Encounter (Signed)
lmomtcb x1 for pt 

## 2012-02-17 NOTE — Telephone Encounter (Signed)
Called, spoke with.  States the onmicef that was given on 01/11/12 worked well but as soon as she comes off abx symptoms return.  C/o bad HA, having dark green mucus when doing saline rinses, and sinus congestion.  Symptoms have been going on off and on since Summer - improves while on abx but return after coming off abx.  Requesting OV -- last seen 08/13/11 and asked to f/u in 4 months.  Also, reports she is taking zyrtec and singulair qhs -- would like to know if she should substitute either of these for allegra d.  Pls advise.  Thank you.    Allergies  Allergen Reactions  . Amoxicillin-Pot Clavulanate Nausea And Vomiting  . Doxycycline Nausea And Vomiting

## 2012-02-17 NOTE — Telephone Encounter (Signed)
Offer Rx cleocin 150 mg, # 40, 1 tab 4x daily x 10 days  Recommend a decongestant like sudafed, or otc Sudafed-PE.   Recommend saline nasal rinse- Neti pot or squeeze bottle  If no better we will want to get a CT of her sinuses.

## 2012-03-03 ENCOUNTER — Other Ambulatory Visit

## 2012-03-08 ENCOUNTER — Telehealth: Payer: Self-pay | Admitting: Internal Medicine

## 2012-03-08 MED ORDER — CLINDAMYCIN HCL 150 MG PO CAPS: 150.0000 mg | ORAL_CAPSULE | Freq: Four times a day (QID) | ORAL | Status: DC

## 2012-03-08 NOTE — Telephone Encounter (Signed)
LMOM informing pt that rx was sent to pharm for clindamycin

## 2012-03-08 NOTE — Telephone Encounter (Signed)
lmomtcb x1 for pt--RX has been sent to the pharmacy 

## 2012-03-08 NOTE — Telephone Encounter (Signed)
I spoke with pt and she c/o cough w/ light yellow phlem (but is better), head congestion, chest congestion. Pt stated this has been going on since the summer off and on. She is doing nasal rinses, taking zyrtec, singulair and using her nasal sprays. She is requesting refill on clindamycin. Please advise Dr. Maple Hudson thanks  Allergies  Allergen Reactions  . Amoxicillin-Pot Clavulanate Nausea And Vomiting  . Doxycycline Nausea And Vomiting

## 2012-03-08 NOTE — Telephone Encounter (Signed)
OK to refill clindamycin 150 mg, # 40, 1 four times daily

## 2012-03-08 NOTE — Telephone Encounter (Signed)
Returning call cell [phone]

## 2012-03-10 ENCOUNTER — Ambulatory Visit: Admitting: Oncology

## 2012-03-11 ENCOUNTER — Telehealth: Payer: Self-pay | Admitting: Oncology

## 2012-03-11 NOTE — Telephone Encounter (Signed)
Letter sent to pt from Dr. Aaron Edelman

## 2012-04-28 ENCOUNTER — Other Ambulatory Visit (HOSPITAL_BASED_OUTPATIENT_CLINIC_OR_DEPARTMENT_OTHER): Admitting: Lab

## 2012-04-28 DIAGNOSIS — C50919 Malignant neoplasm of unspecified site of unspecified female breast: Secondary | ICD-10-CM

## 2012-04-28 LAB — COMPREHENSIVE METABOLIC PANEL (CC13)
Albumin: 3.5 g/dL (ref 3.5–5.0)
Alkaline Phosphatase: 143 U/L (ref 40–150)
BUN: 16 mg/dL (ref 7.0–26.0)
CO2: 31 mEq/L — ABNORMAL HIGH (ref 22–29)
Calcium: 8.9 mg/dL (ref 8.4–10.4)
Chloride: 103 mEq/L (ref 98–107)
Glucose: 90 mg/dl (ref 70–99)
Potassium: 4.1 mEq/L (ref 3.5–5.1)
Sodium: 140 mEq/L (ref 136–145)
Total Protein: 6.5 g/dL (ref 6.4–8.3)

## 2012-04-28 LAB — CBC WITH DIFFERENTIAL/PLATELET
Basophils Absolute: 0 10*3/uL (ref 0.0–0.1)
Eosinophils Absolute: 0.5 10*3/uL (ref 0.0–0.5)
HCT: 38.3 % (ref 34.8–46.6)
HGB: 12.9 g/dL (ref 11.6–15.9)
MCH: 30 pg (ref 25.1–34.0)
MONO#: 0.4 10*3/uL (ref 0.1–0.9)
NEUT#: 2.4 10*3/uL (ref 1.5–6.5)
NEUT%: 54.4 % (ref 38.4–76.8)
RDW: 12.3 % (ref 11.2–14.5)
lymph#: 1.1 10*3/uL (ref 0.9–3.3)

## 2012-05-02 ENCOUNTER — Ambulatory Visit (HOSPITAL_BASED_OUTPATIENT_CLINIC_OR_DEPARTMENT_OTHER): Admitting: Oncology

## 2012-05-02 ENCOUNTER — Telehealth: Payer: Self-pay | Admitting: *Deleted

## 2012-05-02 VITALS — BP 134/91 | HR 103 | Temp 98.6°F | Resp 20 | Ht 63.0 in | Wt 161.9 lb

## 2012-05-02 DIAGNOSIS — Z170 Estrogen receptor positive status [ER+]: Secondary | ICD-10-CM

## 2012-05-02 DIAGNOSIS — Z23 Encounter for immunization: Secondary | ICD-10-CM

## 2012-05-02 DIAGNOSIS — C50919 Malignant neoplasm of unspecified site of unspecified female breast: Secondary | ICD-10-CM

## 2012-05-02 MED ORDER — GABAPENTIN 300 MG PO CAPS: 300.0000 mg | ORAL_CAPSULE | Freq: Every day | ORAL | Status: DC

## 2012-05-02 MED ORDER — TAMOXIFEN CITRATE 20 MG PO TABS: 20.0000 mg | ORAL_TABLET | Freq: Every day | ORAL | Status: DC

## 2012-05-02 MED ORDER — INFLUENZA VIRUS VACC SPLIT PF IM SUSP
0.5000 mL | Freq: Once | INTRAMUSCULAR | Status: AC
  Administered 2012-05-02: 0.5 mL via INTRAMUSCULAR
  Filled 2012-05-02: qty 0.5

## 2012-05-02 NOTE — Telephone Encounter (Signed)
Gave patient appointment for 09-30-2012 at 3:15pm

## 2012-05-02 NOTE — Progress Notes (Signed)
ID: [name]   DOB: 1951/04/21  MR#: [id number]  WUJ#:[id number]   PCP: GYN: SU: Avel Peace OTHER MD: Fannie Knee  HISTORY OF PRESENT ILLNESS: [name] had routine screening mammography March 29th, 2011 at the [facility].  This showed a possible mass in the left breast.  She was recalled for additional studies on April 5th.  This study demonstrated a focal mass in the outer mid-portion of the left breast with minimal irregularity.  Ultrasound showed an ovoid hypoechoic mass measuring up to 8 mm.  Again, there was minimal irregularity noted but biopsy was suggested and performed the same day.  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  The proliferation marker was 19% and there was no evidence of Her-2 amplification by CISH with a ratio of 1.03.  With this information, the patient was referred to Dr. Abbey Chatters and bilateral breast MRIs were obtained on August 27, 2009.  This showed an 11 millimeter mass in the central portion of the left breast correlated with the known malignancy.  There were no other areas suspicious for cancer and on April 26th, 2011, the patient underwent left axillary sentinel lymph node mapping and sampling and left lumpectomy with the final pathology from that procedure ([id number]) showing a 1.3 cm Grade 1 invasive ductal carcinoma, with negative margins, and 0 of 4 sentinel lymph nodes involved.  Her subsequent history is as detailed below  INTERVAL HISTORY: [name] returns today for follow-up of her breast cancer. The interval history is significant chiefly for her husband's continuing clinical problems. He had surgery in June, and since then he set further problems with pain and swelling in both lower extremities. He has significant peripheral vascular disease. Unfortunately he continues to smoke.  REVIEW OF SYSTEMS: She is tolerating  the tamoxifen with no significant complaints. She has gained some weight, but she admits that she has "poor willpower". She goes to plan a fitness perhaps twice a week. She is going to the bariatric clinic. She is of course her husband's primary caregiver in addition to her work. A detailed review of systems is otherwise noncontributory  PAST MEDICAL HISTORY: Past Medical History  Diagnosis Date  . Breast cancer 03/24/2011  . Asthma   . Allergy history unknown   . Sinusitis   . Sleep apnea   Significant for glaucoma, status post laser surgery bilaterally.  History of unilateral salpingo-oophorectomy.  She believes there was a left ovary that was removed.  History of trauma to the left knee with kneecap fracture, not requiring surgery, followed by [name].  History of anxiety disorder.  History of bilateral blepharoplasty.  History of septoplasty.  History of two tooth implants in the right maxilla.  Question of possible osteopenia.  History of benign skin biopsies.    PAST SURGICAL HISTORY: Past Surgical History  Procedure Date  . Breast lumpectomy 08-2009    left   . Nose surgery 12-2006  . Oophorectomy 2001    FAMILY HISTORY Family History  Problem Relation Age of Onset  . Breast cancer Mother   . Asthma Brother   . Emphysema Father   . Heart disease Father   The patient's father died at the age of 55 from emphysema.  He was a Psychologist, occupational.  The patient's mother is alive at age 48.  She had a myocardial infarction about 10 years ago. The patient has two brothers.  There is no history of breast or ovarian cancer in the family.  GYNECOLOGIC HISTORY: She is GX P1.  First pregnancy to term at age 24.  She understands that increases the risk of breast cancer developing.  Last menstrual period was about 8 years ago and she took hormones for about five years.    SOCIAL HISTORY: She works as a Special educational needs teacher.  Her husband [name] is disabled secondary to [name], diabetes, and  peripheral vascular disease. Their daughter, [name], graduated from college in nutrition. She was  married in 2011. The patient's husband also has 4 children from a prior marriage, and 9 grandchildren. The patient attends the [employer] in [place].     ADVANCED DIRECTIVES:  HEALTH MAINTENANCE: History  Substance Use Topics  . Smoking status: Never Smoker   . Smokeless tobacco: Never Used  . Alcohol Use: Yes     Colonoscopy:  PAP:  Bone density: July 2011/ mild osteopenia  Lipid panel:  Allergies  Allergen Reactions  . Amoxicillin-Pot Clavulanate Nausea And Vomiting  . Doxycycline Nausea And Vomiting    Current Outpatient Prescriptions  Medication Sig Dispense Refill  . ALPRAZolam (XANAX) 0.25 MG tablet Take 0.25 mg by mouth at bedtime as needed.        Marland Kitchen aspirin 81 MG tablet Take 81 mg by mouth daily.        . Boric Acid POWD by Does not apply route. Takes one cap at night as needed       . brinzolamide (AZOPT) 1 % ophthalmic suspension Place 1 drop into both eyes 2 (two) times daily.        . budesonide-formoterol (SYMBICORT) 160-4.5 MCG/ACT inhaler Inhale 2 puffs into the lungs 2 (two) times daily. Rinse mouth  3 Inhaler  3  . cetirizine (ZYRTEC) 10 MG tablet Take 1 tablet (10 mg total) by mouth daily.  90 tablet  3  . cholecalciferol (VITAMIN D) 400 UNITS TABS Take 1,000 Units by mouth daily.        . clindamycin (CLEOCIN) 150 MG capsule Take 1 capsule (150 mg total) by mouth 4 (four) times daily.  40 capsule  0  . cloNIDine (CATAPRES-TTS-1) 0.1 mg/24hr patch Place 1 patch (0.1 mg total) onto the skin once a week.  4 patch  12  . dexlansoprazole (DEXILANT) 60 MG capsule Take 1 capsule (60 mg total) by mouth daily.  90 capsule  3  . estradiol (ESTRACE) 0.1 MG/GM vaginal cream Place 2 g vaginally daily.        Marland Kitchen latanoprost (XALATAN) 0.005 % ophthalmic solution Place 1 drop into both eyes at bedtime.        . mometasone (NASONEX) 50 MCG/ACT nasal spray Place 2  sprays into the nose daily.        . montelukast (SINGULAIR) 10 MG tablet Take 1 tablet (10 mg total) by mouth at bedtime.  90 tablet  3  . predniSONE (DELTASONE) 10 MG tablet Take 4 tablets once daily x 2 days, then 3 tablets daily x 2 days, then 2 tablets daily x 2 days, then 1 tablet daily x 2 days, then stop.  20 tablet  0  . tamoxifen (NOLVADEX) 20 MG tablet Take 1 tablet (20 mg total) by mouth daily.  90 tablet  12  . venlafaxine (EFFEXOR) 37.5 MG tablet Take 37.5 mg by mouth daily.      Marland Kitchen venlafaxine (EFFEXOR) 75 MG tablet Take 1 tablet (75 mg total) by mouth daily.  30 tablet  0    OBJECTIVE: middle aged white woman in no acute distress Filed Vitals:   05/02/12 1540  BP: 134/91  Pulse: 103  Temp: 98.6 F (37 C)  Resp: 20     Body mass index is 28.68 kg/(m^2).    ECOG FS: 0  Sclerae unicteric Oropharynx clear No peripheral adenopathy Lungs no rales or rhonchi with good excursion bilaterally Heart regular rate and rhythm Abd benign MSK no focal spinal tenderness, no peripheral edema Neuro: nonfocal Breasts: Right breast no suspicious findings; Left brest s/p lumpectomy, no evidence of local recurrence; the left axilla is benign  LAB RESULTS: Lab Results  Component Value Date   WBC 4.4 04/28/2012   NEUTROABS 2.4 04/28/2012   HGB 12.9 04/28/2012   HCT 38.3 04/28/2012   MCV 89.1 04/28/2012   PLT 244 04/28/2012      Chemistry      Component Value Date/Time   NA 140 04/28/2012 1227   NA 139 08/31/2011 1201   K 4.1 04/28/2012 1227   K 3.7 08/31/2011 1201   CL 103 04/28/2012 1227   CL 102 08/31/2011 1201   CO2 31* 04/28/2012 1227   CO2 30 08/31/2011 1201   BUN 16.0 04/28/2012 1227   BUN 13 08/31/2011 1201   CREATININE 0.9 04/28/2012 1227   CREATININE 0.82 08/31/2011 1201      Component Value Date/Time   CALCIUM 8.9 04/28/2012 1227   CALCIUM 9.1 08/31/2011 1201   ALKPHOS 143 04/28/2012 1227   ALKPHOS 137* 08/31/2011 1201   AST 20 04/28/2012 1227   AST 23 08/31/2011  1201   ALT 19 04/28/2012 1227   ALT 18 08/31/2011 1201   BILITOT 0.35 04/28/2012 1227   BILITOT 0.2* 08/31/2011 1201       Lab Results  Component Value Date   LABCA2 25 04/28/2012    No components found with this basename: [id number]    No results found for this basename: INR:1;PROTIME:1 in the last 168 hours  Urinalysis No results found for this basename: colorurine,  appearanceur,  labspec,  phurine,  glucoseu,  hgbur,  bilirubinur,  ketonesur,  proteinur,  urobilinogen,  nitrite,  leukocytesur    STUDIES:  Mammogram 08/19/2011 shows very dense breasts, but no suspicious finding. Bone density from 2011 was normal.  Ct Chest W Contrast  09/01/2011  *RADIOLOGY REPORT*  Clinical Data: Left breast cancer diagnosed 2011, status post lumpectomy and radiation therapy.  Restaging.  CT CHEST WITH CONTRAST  Technique:  Multidetector CT imaging of the chest was performed following the standard protocol during bolus administration of intravenous contrast.  Contrast: 80mL OMNIPAQUE IOHEXOL 300 MG/ML  SOLN  Comparison: PET CT 09/01/2011, dictated separately  Findings: 2 mm hypodensity in the left thyroid lobe is unlikely to be clinically significant.  Heart size is normal.  No pericardial or pleural effusion.  No lymphadenopathy.  Left breast lower outer quadrant presumed lumpectomy site is identified.  Central airways are patent.  The lungs are clear.  No lytic or sclerotic osseous lesion.  IMPRESSION: No CT evidence for thoracic metastasis.  No acute abnormality.  Original Report Authenticated By: Harrel Lemon, M.D.   Nm Pet Image Restag (ps) Skull Base To Thigh  09/01/2011  *RADIOLOGY REPORT*  Clinical Data: Subsequent treatment strategy for breast carcinoma. 61 year old female with invasive ductal carcinoma and ductal carcinoma in situ of the left breast.  NUCLEAR MEDICINE PET SKULL BASE TO THIGH  Fasting Blood Glucose:  97  Technique:  15.5  mCi F-18 FDG was injected intravenously. CT data  was obtained and used for attenuation correction and anatomic localization only.  (This was not acquired as a diagnostic CT examination.) Additional exam technical data entered on technologist worksheet.  Comparison:  None  Findings:  Neck: No hypermetabolic lymph nodes in the neck.  Chest:  There is a focus of hypermetabolic activity in the posterior left breast (image 84) likely at biopsy site.  There are hypermetabolic left axillary lymph nodes.  No hypermetabolic supraclavicular or infraclavicular nodes.  No hypermetabolic internal mammary nodes.  Review of the lung parenchyma demonstrates no suspicious pulmonary nodules.  Abdomen/Pelvis:  No abnormal hypermetabolic activity within the liver, pancreas, adrenal glands, or spleen.  No hypermetabolic lymph nodes in the abdomen or pelvis.  [place]:  No focal hypermetabolic activity to suggest skeletal metastasis.  There is a sclerotic lesion in the right iliac bone which appears benign.  IMPRESSION:  1.  No evidence of local nodal breast cancer metastasis or systemic metastasis. 2.  Hypermetabolic activity in the posterior left breast presumably at the biopsy site.  Original Report Authenticated By: Genevive Bi, M.D.    ASSESSMENT: 61 year old [place] woman status post left lumpectomy and sentinel lymph node dissection in April 2011 for a T1c N0, stage IA invasive ductal carcinoma, grade 1,strongly estrogen and progesterone receptor positive, HER2 negative with a borderline proliferation fraction and normal preoperative CA27.29.  She completed radiation therapy July 2011 and started tamoxifen at that time with good tolerance.   PLAN: We spent the better part of today's visit going over her choices. She understands that switching from tamoxifen to an aromatase inhibitor now and continuing for 3 years would give her an additional 2-3% risk reduction as compared to 5 years of tamoxifen. The other choice for her would be to take tamoxifen for 10 years,  which gives a similar benefit.  After discussing the possible toxicities, side effects, and complications of aromatase inhibitors, she very much prefer to stay in tamoxifen and quiet and take a full 10 years of it.  She is going to see [place] again in late May, right after her mammogram. Her next visit after that will be February of 2015. She knows to call for any problems that may develop before the next visit.  Isabele Lollar C    05/02/2012

## 2012-05-17 ENCOUNTER — Other Ambulatory Visit: Payer: Self-pay | Admitting: *Deleted

## 2012-05-17 MED ORDER — ESTRADIOL 0.1 MG/GM VA CREA: 2.0000 g | TOPICAL_CREAM | Freq: Every day | VAGINAL | Status: DC

## 2012-05-26 ENCOUNTER — Other Ambulatory Visit: Admitting: Lab

## 2012-06-02 ENCOUNTER — Ambulatory Visit: Admitting: Oncology

## 2012-06-07 ENCOUNTER — Ambulatory Visit (INDEPENDENT_AMBULATORY_CARE_PROVIDER_SITE_OTHER): Admitting: Internal Medicine

## 2012-06-07 ENCOUNTER — Telehealth: Payer: Self-pay | Admitting: Internal Medicine

## 2012-06-07 VITALS — BP 122/70 | HR 104 | Ht 63.0 in | Wt 161.8 lb

## 2012-06-07 DIAGNOSIS — J329 Chronic sinusitis, unspecified: Secondary | ICD-10-CM

## 2012-06-07 DIAGNOSIS — J0100 Acute maxillary sinusitis, unspecified: Secondary | ICD-10-CM

## 2012-06-07 MED ORDER — MONTELUKAST SODIUM 10 MG PO TABS: 10.0000 mg | ORAL_TABLET | Freq: Every day | ORAL | Status: DC

## 2012-06-07 MED ORDER — CETIRIZINE HCL 10 MG PO TABS: 10.0000 mg | ORAL_TABLET | Freq: Every day | ORAL | Status: DC

## 2012-06-07 MED ORDER — METHYLPREDNISOLONE ACETATE 80 MG/ML IJ SUSP
80.0000 mg | Freq: Once | INTRAMUSCULAR | Status: AC
  Administered 2012-06-07: 80 mg via INTRAMUSCULAR

## 2012-06-07 MED ORDER — CLINDAMYCIN HCL 150 MG PO CAPS: 150.0000 mg | ORAL_CAPSULE | Freq: Four times a day (QID) | ORAL | Status: DC

## 2012-06-07 MED ORDER — PHENYLEPHRINE HCL 1 % NA SOLN
3.0000 [drp] | Freq: Once | NASAL | Status: AC
  Administered 2012-06-07: 3 [drp] via NASAL

## 2012-06-07 NOTE — Telephone Encounter (Signed)
Called, spoke with pt.  C/o blowing thick, green mucus from sinuses, feels full in head, pressure in head, difficulty hearing, having sweats and chills x 1 wk.  Requesting OV this am.  Scheduled to see Dr. Maple Hudson at 11 am today -- pt aware and voiced no further questions or concerns at this time.

## 2012-06-07 NOTE — Patient Instructions (Addendum)
Script for clindamycin antibiotic sent  Continue saline rinse  Consider trying otc decongestant Sudafed-PE (not later than lunch time)  Neb neo nasal  Depo 80

## 2012-06-07 NOTE — Progress Notes (Signed)
04/28/11- [name] never smoker with a long history of allergic rhinitis, sinusitis and urticaria. Primary care is through [name].[name] is her oncologist treating breast cancer with history of left lumpectomy, radiation and tamoxifen. She has had a past history of pneumonia, a diagnosis of asthma and bronchitis noted after treatment for the cancer. She has not had either flu vaccine or pneumonia vaccine. She had been treated by [name] over many years for her allergy complaints and had been on allergy vaccine several times. She was to begin again in November of 2010 but apparently did not do so. With his retirement, she is seeking to change practice. She is complaining of fullness and popping in her ears, heavy sensation in the chest but not much itch, sneeze or drainage. Nasal symptoms are usually perennial, worse in the fall and winter with mold exposure. She has had education on avoidance and control of dust and environmental allergens.  She was  skin tested at age 77 because of sinusitis then. She has given her allergy vaccine or had her husband give it, over many years. They also give her husband's insulin injections. Nasal surgery with septoplasty by Dr. Annalee Genta in 2008. Chest x-ray 03/24/2011 no active disease.  08/13/11- [name] never smoker with a long history of allergic rhinitis, asthma/ bronchitis, sinusitis and urticaria, complicated by past hx breast Ca. Cough producing thick yellow sputum. Symbicort helped but she ran out of the sample. Continues allergy vaccine based on skin testing from another office, with our vaccine made here. She says she really feels it helps, especially her head congestion. CXR 03/24/2011-no active disease, normal. PFT 04/29/2011- WNL, FEV1/FVC 0.74.  06/07/12- [name] never smoker with a long history of allergic rhinitis, asthma/ bronchitis, sinusitis and urticaria, complicated by past hx breast Ca. Follows For: Sinus drainage  (green, thick) - Congestion at night - Using saline rinses - Sinus pressure - Ear pressure - Mild chest tightness Acute visit-for the past week has had head congestion and won't drain, some chills last night. Frontal headache. Ears full. Postnasal drip without sore throat. Chest tight. She had dropped off allergy shots. Taking clindamycin 150 mg 4 times daily and using squeeze bottle saline nasal rinse.  ROS-see HPI Constitutional:   No-   weight loss, night sweats, fevers, chills, fatigue, lassitude. HEENT:   + headaches, difficulty swallowing, tooth/dental problems, sore throat,       No-  sneezing, itching, +ear ache, +nasal congestion, +post nasal drip,  CV:  No-   chest pain, orthopnea, PND, swelling in lower extremities, anasarca, dizziness, palpitations Resp: No-   shortness of breath with exertion or at rest.              No-   productive cough,  No non-productive cough,  No- coughing up of blood.              No-   change in color of mucus.  Mild wheezing + chest tightness..   Skin: No-   rash or lesions. GI:  No-   heartburn, indigestion, abdominal pain, nausea, vomiting, diarrhea,                 change in bowel habits, loss of appetite GU: No-   dysuria, change in color of urine, no urgency or frequency.  No- flank pain. MS:  No-   joint pain or swelling.  No- decreased range of motion.  No- back pain. Neuro-     nothing unusual  Psych:  No- change in mood or affect. No depression or anxiety.  No memory loss.  OBJ General- Alert, Oriented, Affect-appropriate, Distress- none acute Skin- rash-none, lesions- none, excoriation- none Lymphadenopathy- none Head- atraumatic            Eyes- Gross vision intact, PERRLA, conjunctivae clear secretions            Ears- Hearing, canals-cerumen            Nose- +turbinate edema, no-Septal dev, mucus, polyps, erosion, perforation             Throat- Mallampati II , +mucosa red , drainage- none, tonsils- atrophic Neck- flexible , trachea  midline, no stridor , thyroid nl, carotid no bruit Chest - symmetrical excursion , unlabored           Heart/CV- RRR , no murmur , no gallop  , no rub, nl s1 s2                           - JVD- none , edema- none, stasis changes- none, varices- none           Lung- few rhonchi right base, unlabored, + Mild wheeze and light cough ,   No-  dullness-none, rub- none           Chest wall-  Abd-  Br/ Gen/ Rectal- Not done, not indicated Extrem- cyanosis- none, clubbing, none, atrophy- none, strength- nl Neuro- grossly intact to observation

## 2012-06-18 ENCOUNTER — Encounter: Payer: Self-pay | Admitting: Internal Medicine

## 2012-06-18 DIAGNOSIS — J320 Chronic maxillary sinusitis: Secondary | ICD-10-CM | POA: Insufficient documentation

## 2012-06-18 NOTE — Assessment & Plan Note (Signed)
Upper respiratory infection with rhinosinusitis and early bronchitis Plan clindamycin 150 mg 4 times a day for 10 days, Sudafed, continue the saline rinse, extra fluids

## 2012-06-24 ENCOUNTER — Telehealth: Payer: Self-pay | Admitting: Internal Medicine

## 2012-06-24 MED ORDER — CEFDINIR 300 MG PO CAPS: 300.0000 mg | ORAL_CAPSULE | Freq: Two times a day (BID) | ORAL | Status: DC

## 2012-06-24 NOTE — Telephone Encounter (Signed)
Called, spoke with pt.  Informed her of below recs per Dr. Maple Hudson.  She verbalized understanding of this and is aware rx sent to [facility] [place].  Advised to pls call back or seek emergency care if needed if symptoms do not improve or worsen.  She verbalized understanding and voiced no further questions or concerns at this time.

## 2012-06-24 NOTE — Telephone Encounter (Signed)
Per CY-okay to give Cefdinir 300 mg #20 take 1 po BID no refills.

## 2012-06-24 NOTE — Telephone Encounter (Signed)
Called, spoke with pt.  She was seen by Dr. Maple Hudson on Jun 07, 2012:  Patient Instructions     Script for clindamycin antibiotic sent  Continue saline rinse  Consider trying otc decongestant Sudafed-PE (not later than lunch time)  Neb neo nasal  Depo 80   --------  Pt states she is still using the saline rinse, alternating with zyrtec and singulair, taking sudafed PE daily, and finished clindamycin rx a few days ago.  Still has prod cough and blowing mucus from nose with thick lighter green mucus, has sinus pressure, HA, head and chest congestion, and occas wheezing.  Denies chest tightness, chest pain, or f/c/s now.  Requesting further recs.  Dr. Maple Hudson, pls advise.  Thank you.  [facility]  Allergies verified with pt:  Allergies  Allergen Reactions  . Amoxicillin-Pot Clavulanate Nausea And Vomiting  . Doxycycline Nausea And Vomiting

## 2012-07-21 ENCOUNTER — Ambulatory Visit (INDEPENDENT_AMBULATORY_CARE_PROVIDER_SITE_OTHER)
Admission: RE | Admit: 2012-07-21 | Discharge: 2012-07-21 | Disposition: A | Discharge status: Home / Self Care | Source: Ambulatory Visit | Attending: Internal Medicine | Admitting: Internal Medicine

## 2012-07-21 ENCOUNTER — Ambulatory Visit (INDEPENDENT_AMBULATORY_CARE_PROVIDER_SITE_OTHER): Admitting: Internal Medicine

## 2012-07-21 ENCOUNTER — Encounter: Payer: Self-pay | Admitting: Internal Medicine

## 2012-07-21 VITALS — BP 138/82 | HR 90 | Ht 63.0 in | Wt 164.6 lb

## 2012-07-21 DIAGNOSIS — J329 Chronic sinusitis, unspecified: Secondary | ICD-10-CM

## 2012-07-21 DIAGNOSIS — J0100 Acute maxillary sinusitis, unspecified: Secondary | ICD-10-CM

## 2012-07-21 MED ORDER — MOMETASONE FUROATE 50 MCG/ACT NA SUSP: 2.0000 | Freq: Every day | NASAL | Status: DC

## 2012-07-21 MED ORDER — CEFDITOREN PIVOXIL 400 MG PO TABS: ORAL_TABLET | ORAL | Status: DC

## 2012-07-21 NOTE — Progress Notes (Signed)
04/28/11- 60 yoF never smoker with a long history of allergic rhinitis, sinusitis and urticaria. Primary care is through [facility].[name] is her oncologist treating breast cancer with history of left lumpectomy, radiation and tamoxifen. She has had a past history of pneumonia, a diagnosis of asthma and bronchitis noted after treatment for the cancer. She has not had either flu vaccine or pneumonia vaccine. She had been treated by [name] over many years for her allergy complaints and had been on allergy vaccine several times. She was to begin again in November of 2010 but apparently did not do so. With his retirement, she is seeking to change practice. She is complaining of fullness and popping in her ears, heavy sensation in the chest but not much itch, sneeze or drainage. Nasal symptoms are usually perennial, worse in the fall and winter with mold exposure. She has had education on avoidance and control of dust and environmental allergens.  She was  skin tested at age 12 because of sinusitis then. She has given her allergy vaccine or had her husband give it, over many years. They also give her husband's insulin injections. Nasal surgery with septoplasty by Dr. Annalee Genta in 2008. Chest x-ray 03/24/2011 no active disease.  08/13/11- 60 yoF never smoker with a long history of allergic rhinitis, asthma/ bronchitis, sinusitis and urticaria, complicated by past hx breast Ca. Cough producing thick yellow sputum. Symbicort helped but she ran out of the sample. Continues allergy vaccine based on skin testing from another office, with our vaccine made here. She says she really feels it helps, especially her head congestion. CXR 03/24/2011-no active disease, normal. PFT 04/29/2011- WNL, FEV1/FVC 0.74.  06/07/12- 60 yoF never smoker with a long history of allergic rhinitis, asthma/ bronchitis, sinusitis and urticaria, complicated by past hx breast Ca. Follows For: Sinus drainage  (green, thick) - Congestion at night - Using saline rinses - Sinus pressure - Ear pressure - Mild chest tightness Acute visit-for the past week has had head congestion and won't drain, some chills last night. Frontal headache. Ears full. Postnasal drip without sore throat. Chest tight. She had dropped off allergy shots. Taking clindamycin 150 mg 4 times daily and using squeeze bottle saline nasal rinse.  07/21/12- 61 yoF never smoker with a long history of allergic rhinitis, asthma/ bronchitis, sinusitis and urticaria, complicated by past hx breast Ca ACUTE VISIT: ? sinus infection, unable to sleep due to congestion in head,. Has never really felt her head cleared since last visit despite 10 days Cleocin/ Sudafed then. . Frontal and maxillary pressure, thick mucus. Got worse again last week. Using saline rinse. [name] did sinus surgery 2008. CT chest 09/01/11 IMPRESSION:  No CT evidence for thoracic metastasis. No acute abnormality.  Original Report Authenticated By: Harrel Lemon, M.D.  ROS-see HPI Constitutional:   No-   weight loss, night sweats, fevers, chills, fatigue, lassitude. HEENT:   + headaches, difficulty swallowing, tooth/dental problems, sore throat,       No-  sneezing, itching, ear ache, +nasal congestion, +post nasal drip,  CV:  No-   chest pain, orthopnea, PND, swelling in lower extremities, anasarca, dizziness, palpitations Resp: No-   shortness of breath with exertion or at rest.              No-   productive cough,  No non-productive cough,  No- coughing up of blood.              No-   change in  color of mucus.  No- wheezing, chest tightness..   Skin: No-   rash or lesions. GI:  No-   heartburn, indigestion, abdominal pain, nausea, vomiting,  GU:  MS:  No-   joint pain or swelling.  . Neuro-     nothing unusual Psych:  No- change in mood or affect. No depression or anxiety.  No memory loss.  OBJ General- Alert, Oriented, Affect-appropriate, Distress- none  acute Skin- rash-none, lesions- none, excoriation- none Lymphadenopathy- none Head- atraumatic            Eyes- Gross vision intact, PERRLA, conjunctivae clear secretions            Ears- Hearing, canals-cerumen non-obstructing            Nose- +turbinate edema, no-Septal dev, mucus, polyps, erosion, perforation             Throat- Mallampati II , mucosa clear, drainage- none, tonsils- atrophic Neck- flexible , trachea midline, no stridor , thyroid nl, carotid no bruit Chest - symmetrical excursion , unlabored           Heart/CV- RRR , no murmur , no gallop  , no rub, nl s1 s2                           - JVD- none , edema- none, stasis changes- none, varices- none           Lung- few rhonchi right base, unlabored, +wheeze- none, cough-none ,   No-  dullness, rub- none           Chest wall-  Abd-  Br/ Gen/ Rectal- Not done, not indicated Extrem- cyanosis- none, clubbing, none, atrophy- none, strength- nl Neuro- grossly intact to observation

## 2012-07-21 NOTE — Patient Instructions (Addendum)
Order- limited CT sinuses     Dx chronic sinusitis  Samples and script to take Spectracef 400 mg twice daily with food, x 10 days  Ok to continue Sudafed, Mucinex, and saline rinse

## 2012-07-22 NOTE — Assessment & Plan Note (Signed)
Probably acute on chronic sinusitis, with hx of prior surgical drainage procedure.  Plan- Spectracef x 10 days, Limited CT sinus

## 2012-07-25 ENCOUNTER — Telehealth: Payer: Self-pay | Admitting: Internal Medicine

## 2012-07-25 NOTE — Telephone Encounter (Signed)
Spoke with patient made her aware of CT results as listed below per CY. Patient verbalized understanding and nothing further needed at this time.  Result Note    CT sinus- bilateral acute and chronic sinusitis without obstruction seen. Old surgical changes are noted. Recommend continue with treatment as planned.

## 2012-07-25 NOTE — Progress Notes (Signed)
Quick Note:  Spoke with patient made her aware of CT results as listed below per CY. Patient verbalized understanding and nothing further needed at this time. ______

## 2012-08-30 ENCOUNTER — Ambulatory Visit (INDEPENDENT_AMBULATORY_CARE_PROVIDER_SITE_OTHER): Admitting: Internal Medicine

## 2012-08-30 ENCOUNTER — Encounter: Payer: Self-pay | Admitting: Internal Medicine

## 2012-08-30 VITALS — BP 110/66 | HR 86 | Ht 62.25 in | Wt 158.6 lb

## 2012-08-30 DIAGNOSIS — J0100 Acute maxillary sinusitis, unspecified: Secondary | ICD-10-CM

## 2012-08-30 MED ORDER — CEFDITOREN PIVOXIL 400 MG PO TABS: ORAL_TABLET | ORAL | Status: DC

## 2012-08-30 NOTE — Patient Instructions (Addendum)
Script sent to try Spectracef again, this time for 3 weeks.  Continue the saline rinses and call me as needed

## 2012-08-30 NOTE — Progress Notes (Signed)
04/28/11- [name] never smoker with a long history of allergic rhinitis, sinusitis and urticaria. Primary care is through [facility].[name] is her oncologist treating breast cancer with history of left lumpectomy, radiation and tamoxifen. She has had a past history of pneumonia, a diagnosis of asthma and bronchitis noted after treatment for the cancer. She has not had either flu vaccine or pneumonia vaccine. She had been treated by [name] over many years for her allergy complaints and had been on allergy vaccine several times. She was to begin again in November of 2010 but apparently did not do so. With his retirement, she is seeking to change practice. She is complaining of fullness and popping in her ears, heavy sensation in the chest but not much itch, sneeze or drainage. Nasal symptoms are usually perennial, worse in the fall and winter with mold exposure. She has had education on avoidance and control of dust and environmental allergens.  She was  skin tested at age 25 because of sinusitis then. She has given her allergy vaccine or had her husband give it, over many years. They also give her husband's insulin injections. Nasal surgery with septoplasty by Dr. Annalee Genta in 2008. Chest x-ray 03/24/2011 no active disease.  08/13/11- [name] never smoker with a long history of allergic rhinitis, asthma/ bronchitis, sinusitis and urticaria, complicated by past hx breast Ca. Cough producing thick yellow sputum. Symbicort helped but she ran out of the sample. Continues allergy vaccine based on skin testing from another office, with our vaccine made here. She says she really feels it helps, especially her head congestion. CXR 03/24/2011-no active disease, normal. PFT 04/29/2011- WNL, FEV1/FVC 0.74.  06/07/12- [name] never smoker with a long history of allergic rhinitis, asthma/ bronchitis, sinusitis and urticaria, complicated by past hx breast Ca. Follows For: Sinus drainage  (green, thick) - Congestion at night - Using saline rinses - Sinus pressure - Ear pressure - Mild chest tightness CTAcute visit-for the past week has had head congestion and won't drain, some chills last night. Frontal headache. Ears full. Postnasal drip without sore throat. Chest tight. She had dropped off allergy shots. Taking clindamycin 150 mg 4 times daily and using squeeze bottle saline nasal rinse.  07/21/12- 61 yoF never smoker with a long history of allergic rhinitis, asthma/ bronchitis, sinusitis and urticaria, complicated by past hx breast Ca ACUTE VISIT: ? sinus infection, unable to sleep due to congestion in head,. Has never really felt her head cleared since last visit despite 10 days Cleocin/ Sudafed then. . Frontal and maxillary pressure, thick mucus. Got worse again last week. Using saline rinse. [name] did sinus surgery 2008. CT chest 09/01/11 IMPRESSION:  No CT evidence for thoracic metastasis. No acute abnormality.  Original Report Authenticated By: Harrel Lemon, M.D.  08/30/12- [name] never smoker with a long history of allergic rhinitis, asthma/ bronchitis, sinusitis and urticaria, complicated by past hx breast Ca FOLLOWS FOR: states she got better since last visit but now the symptoms of nasal drainage-dark green in color have returned. Spectracef worked best- "huge difference" compared with other antibiotics ,but didn't last long enough. CT max/fac 07/25/12 IMPRESSION:  Postsurgical changes as described. Acute and chronic bilateral  maxillary sinusitis. Right frontal sinus fluid could be acute or  chronic.  Original Report Authenticated By: Davonna Belling, M.D.  ROS-see HPI Constitutional:   No-   weight loss, night sweats, fevers, chills, fatigue, lassitude. HEENT:   + headaches, difficulty swallowing, tooth/dental problems, sore  throat,       No-  sneezing, itching, ear ache, +nasal congestion, +post nasal drip,  CV:  No-   chest pain, orthopnea, PND, swelling  in lower extremities, anasarca, dizziness, palpitations Resp: No-   shortness of breath with exertion or at rest.              No-   productive cough,  No non-productive cough,  No- coughing up of blood.              No-   change in color of mucus.  No- wheezing, chest tightness..   Skin: No-   rash or lesions. GI:  No-   heartburn, indigestion, abdominal pain, nausea, vomiting,  GU:  MS:  No-   joint pain or swelling.  . Neuro-     nothing unusual Psych:  No- change in mood or affect. No depression or anxiety.  No memory loss.  OBJ General- Alert, Oriented, Affect-appropriate, Distress- none acute Skin- rash-none, lesions- none, excoriation- none Lymphadenopathy- none Head- atraumatic            Eyes- Gross vision intact, PERRLA, conjunctivae clear secretions            Ears- Hearing, canals- +cerumen non-obstructing            Nose- +turbinate edema, no-Septal dev, +mucus beige, polyps, erosion, perforation             Throat- Mallampati II , mucosa clear, drainage- none, tonsils- atrophic Neck- flexible , trachea midline, no stridor , thyroid nl, carotid no bruit Chest - symmetrical excursion , unlabored           Heart/CV- RRR , no murmur , no gallop  , no rub, nl s1 s2                           - JVD- none , edema- none, stasis changes- none, varices- none           Lung- few rhonchi right base, unlabored, +wheeze- none, cough-none ,   No-  dullness, rub- none           Chest wall-  Abd-  Br/ Gen/ Rectal- Not done, not indicated Extrem- cyanosis- none, clubbing, none, atrophy- none, strength- nl Neuro- grossly intact to observation

## 2012-08-31 ENCOUNTER — Telehealth: Payer: Self-pay | Admitting: Internal Medicine

## 2012-08-31 MED ORDER — SPECTRACEF 400 MG PO TABS: ORAL_TABLET | ORAL | Status: DC

## 2012-08-31 NOTE — Telephone Encounter (Signed)
Per CY - this medication is still being made. The generic form is no longer available but brand name is.  I [facility] to [place] at [facility] and asked if she could get the brand name for the pt. She states that she can, but needs [place] to send in rx again but make sure it states DAW. [facility].

## 2012-09-05 NOTE — Assessment & Plan Note (Signed)
Plan-Spectracef 300 mg twice daily for 3 weeks. Consider potential need for aerosol antibiotic/ tobramycin.

## 2012-09-20 ENCOUNTER — Telehealth: Payer: Self-pay | Admitting: Internal Medicine

## 2012-09-20 ENCOUNTER — Other Ambulatory Visit: Payer: Self-pay | Admitting: Oncology

## 2012-09-20 DIAGNOSIS — Z853 Personal history of malignant neoplasm of breast: Secondary | ICD-10-CM

## 2012-09-20 MED ORDER — CLINDAMYCIN HCL 300 MG PO CAPS: 300.0000 mg | ORAL_CAPSULE | Freq: Two times a day (BID) | ORAL | Status: DC

## 2012-09-20 NOTE — Telephone Encounter (Signed)
Spoke with patient,  Patient states that she is still having headache and increasing sinus pressure, ears constantly popping/crackling. States she is having a slight cough, she is still doing her saline washes but not getting much out. States what she does get out is greenish yellow in color. Patient currently on spectracef x3 weeks started this 08/31/12, can not tell much relief w this med. Patient is requesting rec's from Dr. Maple Hudson on this, please advise, thank you!  Last OV: 08/30/12 w 3 month f/u not scheduled at this time   Allergies  Allergen Reactions  . Amoxicillin-Pot Clavulanate Nausea And Vomiting  . Doxycycline Nausea And Vomiting

## 2012-09-20 NOTE — Telephone Encounter (Signed)
Spoke with patient, she is aware of recs as listed below aware medication will be called into pharmacy.

## 2012-09-20 NOTE — Telephone Encounter (Signed)
Per CY-cleocin 300 mg #20 take 1 po bid no refills ; take with a probiotic like florastor or activia yogurt.

## 2012-09-20 NOTE — Telephone Encounter (Signed)
lmomtcb x1 for pt 

## 2012-09-26 ENCOUNTER — Telehealth: Payer: Self-pay | Admitting: Internal Medicine

## 2012-09-26 NOTE — Telephone Encounter (Signed)
lmomtcb x1 on cell. Office has closed where she works

## 2012-09-26 NOTE — Telephone Encounter (Signed)
Per CY-stop Cleocin, finish Spectracef, then see how she feels

## 2012-09-26 NOTE — Telephone Encounter (Signed)
On 09/20/12:  Ronny Bacon, CMA at 09/20/2012 5:20 PM   Status: Signed            Per CY-cleocin 300 mg #20 take 1 po bid no refills ; take with a probiotic like florastor or activia yogurt.   -------  Called, spoke with pt.  States she cannot take the cleocin d/t the side effects.  Reports she has bad nausea and diarrhea and feels "yuck all day."  Reports she has tried taking the medication with a probiotic, with activa yogurt, with food, and at different times of the day with no change in symptoms.  She would like to know if this can be changed to something else.  Has been able to tolerate cefdinir in the past.  Dr. Maple Hudson, pls advise.  Thank you.  Would like CDY to know she has only a few days left on the spectracef 400 mg she is to take for a total of 3 wks.  Last OV with CDY:  08/30/12; f/u in 3 months No pending OV with CDY  [facility] [place]  Allergies verified with pt: Allergies  Allergen Reactions  . Amoxicillin-Pot Clavulanate Nausea And Vomiting  . Doxycycline Nausea And Vomiting

## 2012-09-27 NOTE — Telephone Encounter (Signed)
Per CY-give patient Cefdinir 300 mg #20 take 1 po BID no refills.

## 2012-09-27 NOTE — Telephone Encounter (Signed)
lmomtcb x1 

## 2012-09-27 NOTE — Telephone Encounter (Signed)
Called spoke with patient Advised of CY's recs as stated below Pt has 2 doses left; took the cleocin this morning Pt would like to know if taking cefdinir would be appropriate as this has helped her in the past and she does not believe "she is over this yet" Advised pt that it looks like CY would like her to finish the spectracef and see how she feels then, but pt is requests to double-check with CY Dr Maple Hudson please advise, thanks!

## 2012-09-28 ENCOUNTER — Ambulatory Visit
Admission: RE | Admit: 2012-09-28 | Discharge: 2012-09-28 | Disposition: A | Discharge status: Home / Self Care | Source: Ambulatory Visit | Attending: Oncology | Admitting: Oncology

## 2012-09-28 DIAGNOSIS — Z853 Personal history of malignant neoplasm of breast: Secondary | ICD-10-CM

## 2012-09-28 MED ORDER — CEFDINIR 300 MG PO CAPS: 300.0000 mg | ORAL_CAPSULE | Freq: Two times a day (BID) | ORAL | Status: DC

## 2012-09-28 NOTE — Telephone Encounter (Signed)
Called, spoke with pt.  Informed her of below per CDY.  She is aware [facility] [place] and to call back if symptoms do not improve or worsen.  She verbalized understanding of instructions and voiced no further questions or concerns at this time.

## 2012-09-30 ENCOUNTER — Encounter: Payer: Self-pay | Admitting: Physician Assistant

## 2012-09-30 ENCOUNTER — Ambulatory Visit (HOSPITAL_BASED_OUTPATIENT_CLINIC_OR_DEPARTMENT_OTHER): Admitting: Physician Assistant

## 2012-09-30 ENCOUNTER — Telehealth: Payer: Self-pay | Admitting: Oncology

## 2012-09-30 ENCOUNTER — Ambulatory Visit (HOSPITAL_BASED_OUTPATIENT_CLINIC_OR_DEPARTMENT_OTHER): Admitting: Lab

## 2012-09-30 VITALS — BP 113/76 | HR 99 | Temp 97.4°F | Resp 20 | Ht 62.0 in | Wt 153.0 lb

## 2012-09-30 DIAGNOSIS — C50912 Malignant neoplasm of unspecified site of left female breast: Secondary | ICD-10-CM

## 2012-09-30 DIAGNOSIS — N952 Postmenopausal atrophic vaginitis: Secondary | ICD-10-CM

## 2012-09-30 DIAGNOSIS — C50919 Malignant neoplasm of unspecified site of unspecified female breast: Secondary | ICD-10-CM

## 2012-09-30 LAB — CBC WITH DIFFERENTIAL/PLATELET
BASO%: 0.9 % (ref 0.0–2.0)
EOS%: 8.6 % — ABNORMAL HIGH (ref 0.0–7.0)
HGB: 13.4 g/dL (ref 11.6–15.9)
MCH: 30.5 pg (ref 25.1–34.0)
MCHC: 34.1 g/dL (ref 31.5–36.0)
MCV: 89.4 fL (ref 79.5–101.0)
MONO%: 7.9 % (ref 0.0–14.0)
RBC: 4.4 10*6/uL (ref 3.70–5.45)
RDW: 12.8 % (ref 11.2–14.5)
lymph#: 1.2 10*3/uL (ref 0.9–3.3)

## 2012-09-30 LAB — COMPREHENSIVE METABOLIC PANEL (CC13)
ALT: 36 U/L (ref 0–55)
AST: 28 U/L (ref 5–34)
Albumin: 3.8 g/dL (ref 3.5–5.0)
Alkaline Phosphatase: 117 U/L (ref 40–150)
Calcium: 9.1 mg/dL (ref 8.4–10.4)
Chloride: 102 mEq/L (ref 98–107)
Potassium: 4 mEq/L (ref 3.5–5.1)
Sodium: 139 mEq/L (ref 136–145)

## 2012-09-30 MED ORDER — ESTRADIOL 0.1 MG/GM VA CREA: 2.0000 g | TOPICAL_CREAM | Freq: Every day | VAGINAL | Status: DC

## 2012-09-30 MED ORDER — TAMOXIFEN CITRATE 20 MG PO TABS: 20.0000 mg | ORAL_TABLET | Freq: Every day | ORAL | Status: DC

## 2012-09-30 NOTE — Telephone Encounter (Signed)
, °

## 2012-09-30 NOTE — Progress Notes (Signed)
ID: Sherlene Shams   DOB: [birth year]  MR#: [id number]  OZH#:[id number]   PCP: GYN: SU: Avel Peace OTHER MD: Fannie Knee  HISTORY OF PRESENT ILLNESS: Erikah had routine screening mammography March 29th, 2011 at the [facility].  This showed a possible mass in the left breast.  She was recalled for additional studies on April 5th.  This study demonstrated a focal mass in the outer mid-portion of the left breast with minimal irregularity.  Ultrasound showed an ovoid hypoechoic mass measuring up to 8 mm.  Again, there was minimal irregularity noted but biopsy was suggested and performed the same day.  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  The proliferation marker was 19% and there was no evidence of Her-2 amplification by CISH with a ratio of 1.03.  With this information, the patient was referred to Dr. Abbey Chatters and bilateral breast MRIs were obtained on August 27, 2009.  This showed an 11 millimeter mass in the central portion of the left breast correlated with the known malignancy.  There were no other areas suspicious for cancer and on April 26th, 2011, the patient underwent left axillary sentinel lymph node mapping and sampling and left lumpectomy with the final pathology from that procedure ([id number]) showing a 1.3 cm Grade 1 invasive ductal carcinoma, with negative margins, and 0 of 4 sentinel lymph nodes involved.  Her subsequent history is as detailed below  INTERVAL HISTORY: Shailynn returns today for follow-up of her left breast cancer. She continues on tamoxifen which she is tolerating well. She does have vaginal dryness for which she utilizes Estrace cream. She has hot flashes but feels like these have actually improved a little. She has no peripheral swelling or evidence of abnormal clotting. She's had no change in vision.  Interval history is  remarkable for Lauraann's husband [name] continuing to have significant health problems, and in fact has recently been diagnosed with a renal carcinoma. Chelsye is still his primary caregiver, so obviously this causes some stress. 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 Her daughter who lives in [place] has just been diagnosed with ADHD.  REVIEW OF SYSTEMS: Gina denies any fevers or chills. She is under the care of Dr. Maple Hudson and is currently on [facility] for a chronic sinusitis. She denies any new cough, phlegm production, increased shortness of breath, chest pain, palpitations. She's eating and drinking well denies any nausea or change in bowel or bladder habits. She's had no abnormal headaches or dizziness. She denies any joint pain, myalgias, or bony pain, and has had no peripheral swelling.  Detailed review of systems is otherwise stable and noncontributory.   PAST MEDICAL HISTORY: Past Medical History  Diagnosis Date  . Breast cancer 03/24/2011  . Asthma   . Allergy history unknown   . Sinusitis   . Sleep apnea   Significant for glaucoma, status post laser surgery bilaterally.  History of unilateral salpingo-oophorectomy.  She believes there was a left ovary that was removed.  History of trauma to the left knee with kneecap fracture, not requiring surgery, followed by [name].  History of anxiety disorder.  History of bilateral blepharoplasty.  History of septoplasty.  History of two tooth implants in the right maxilla.  Question of possible osteopenia.  History of benign skin biopsies.    PAST SURGICAL HISTORY: Past Surgical History  Procedure Laterality Date  . Breast lumpectomy  08-2009    left   . Nose surgery  12-2006  . Oophorectomy  2001    FAMILY HISTORY Family History  Problem Relation Age of Onset  . Breast cancer Mother   . Asthma Brother   . Emphysema Father   . Heart disease Father   The patient's father died at the age of 68 from emphysema.  He was a Psychologist, occupational.   The patient's mother is alive at age 102.  She had a myocardial infarction about 10 years ago. The patient has two brothers.  There is no history of breast or ovarian cancer in the family.  GYNECOLOGIC HISTORY: She is GX P1.  First pregnancy to term at age 34.  She understands that increases the risk of breast cancer developing.  Last menstrual period was about 8 years ago and she took hormones for about five years.    SOCIAL HISTORY: She works as a Special educational needs teacher.  Her husband [name] is disabled secondary to [name], diabetes, and peripheral vascular disease. Their daughter, [name], graduated from college in nutrition. She was  married in 2011. The patient's husband also has 4 children from a prior marriage, and 9 grandchildren. The patient attends the [employer] in [place].     ADVANCED DIRECTIVES:  HEALTH MAINTENANCE: History  Substance Use Topics  . Smoking status: Never Smoker   . Smokeless tobacco: Never Used  . Alcohol Use: Yes     Colonoscopy:  PAP:  Bone density: July 2011/ mild osteopenia  Lipid panel: UTD, Dr. Hollice Espy ([place])  Allergies  Allergen Reactions  . Amoxicillin-Pot Clavulanate Nausea And Vomiting  . Doxycycline Nausea And Vomiting    Current Outpatient Prescriptions  Medication Sig Dispense Refill  . ALPRAZolam (XANAX) 0.25 MG tablet Take 0.25 mg by mouth at bedtime as needed.        Marland Kitchen aspirin 81 MG tablet Take 81 mg by mouth daily.        . Boric Acid POWD by Does not apply route. Takes one cap at night as needed       . brinzolamide (AZOPT) 1 % ophthalmic suspension Place 1 drop into both eyes 2 (two) times daily.        . cefdinir (OMNICEF) 300 MG capsule Take 1 capsule (300 mg total) by mouth 2 (two) times daily.  20 capsule  0  . cetirizine (ZYRTEC) 10 MG tablet Take 1 tablet (10 mg total) by mouth daily.  90 tablet  3  . cholecalciferol (VITAMIN D) 400 UNITS TABS Take 400 Units by mouth daily.       . clindamycin  (CLEOCIN) 300 MG capsule Take 1 capsule (300 mg total) by mouth 2 (two) times daily.  20 capsule  0  . estradiol (ESTRACE) 0.1 MG/GM vaginal cream Place 0.25 Applicatorfuls vaginally daily.  42.5 g  0  . gabapentin (NEURONTIN) 300 MG capsule Take 1 capsule (300 mg total) by mouth at bedtime.  90 capsule  12  . latanoprost (XALATAN) 0.005 % ophthalmic solution Place 1 drop into both eyes at bedtime.        . mometasone (NASONEX) 50 MCG/ACT nasal spray Place 2 sprays into the nose daily.  51 g  3  . montelukast (SINGULAIR) 10 MG tablet Take 1 tablet (10 mg total) by mouth at bedtime.  90 tablet  3  . sertraline (ZOLOFT) 50 MG tablet Take 50 mg by mouth daily.      . SPECTRACEF 400 MG TABS 1 twice  daily x 3 week  42 each  0  . tamoxifen (NOLVADEX) 20 MG tablet Take 1 tablet (20 mg total) by mouth daily.  90 tablet  3  . budesonide-formoterol (SYMBICORT) 160-4.5 MCG/ACT inhaler Inhale 2 puffs into the lungs 2 (two) times daily. Rinse mouth  3 Inhaler  3   No current facility-administered medications for this visit.    OBJECTIVE: middle aged white woman in no acute distress Filed Vitals:   09/30/12 1516  BP: 113/76  Pulse: 99  Temp: 97.4 F (36.3 C)  Resp: 20     Body mass index is 27.98 kg/(m^2).    ECOG FS: 0 Filed Weights   09/30/12 1516  Weight: 153 lb (69.4 kg)    Sclerae unicteric Oropharynx clear No cervical or supraclavicular adenopathy Lungs clear to auscultation bilaterally, no wheezes, no rales or rhonchi with good excursion bilaterally Heart regular rate and rhythm Abdomen soft, nontender to palpation, positive bowel sounds MSK no focal spinal tenderness to palpation, no peripheral edema Neuro: nonfocal, well oriented with appropriate affect Breasts: Right breast is unremarkable. Left breast is status post lumpectomy with no suspicious nodularity or evidence of local recurrence. Axillae are benign bilaterally for palpable adenopathy.   LAB RESULTS: Lab Results   Component Value Date   WBC 4.4 04/28/2012   NEUTROABS 2.4 04/28/2012   HGB 12.9 04/28/2012   HCT 38.3 04/28/2012   MCV 89.1 04/28/2012   PLT 244 04/28/2012      Chemistry      Component Value Date/Time   NA 140 04/28/2012 1227   NA 139 08/31/2011 1201   K 4.1 04/28/2012 1227   K 3.7 08/31/2011 1201   CL 103 04/28/2012 1227   CL 102 08/31/2011 1201   CO2 31* 04/28/2012 1227   CO2 30 08/31/2011 1201   BUN 16.0 04/28/2012 1227   BUN 13 08/31/2011 1201   CREATININE 0.9 04/28/2012 1227   CREATININE 0.82 08/31/2011 1201      Component Value Date/Time   CALCIUM 8.9 04/28/2012 1227   CALCIUM 9.1 08/31/2011 1201   ALKPHOS 143 04/28/2012 1227   ALKPHOS 137* 08/31/2011 1201   AST 20 04/28/2012 1227   AST 23 08/31/2011 1201   ALT 19 04/28/2012 1227   ALT 18 08/31/2011 1201   BILITOT 0.35 04/28/2012 1227   BILITOT 0.2* 08/31/2011 1201       STUDIES:  Bone density from 2011 was normal.  Mm Digital Diagnostic Bilat  09/28/2012   *RADIOLOGY REPORT*  Clinical Data:  History of malignant lumpectomy of the left breast in 2011.  Annual reevaluation.  DIGITAL DIAGNOSTIC BILATERAL MAMMOGRAM WITH CAD  Comparison: 09/17/2011, 08/19/2010, 09/10/2009, 08/13/2009.  Findings:  ACR Breast Density Category 3: The breast tissue is heterogeneously dense.  There are stable scarring changes located within the upper-outer quadrant of the left breast related to the patient's lumpectomy. There are stable benign calcifications in the region of the lumpectomy bed.  There is no specific evidence for recurrent tumor or developing malignancy within either breast.  The right breast is stable, as well.  Mammographic images were processed with CAD.  IMPRESSION: No findings worrisome for recurrent tumor or developing malignancy.  RECOMMENDATION: Bilateral diagnostic mammography in 1 year.  I have discussed the findings and recommendations with the patient. Results were also provided in writing at the conclusion of the visit.   If applicable, a reminder letter will be sent to the patient regarding her next appointment.  BI-RADS CATEGORY 2:  Benign finding(s).  Original Report Authenticated By: Rolla Plate, M.D.    ASSESSMENT: 62 year old [place] woman   (1)  status post left lumpectomy and sentinel lymph node dissection in April 2011 for a T1c N0, stage IA invasive ductal carcinoma, grade 1,strongly estrogen and progesterone receptor positive, HER2 negative with a borderline proliferation fraction and normal preoperative CA27.29.    (2)  She completed radiation therapy July 2011   (3)  started tamoxifen in July 2011, the plan being to continue for a total of 10 years.   PLAN:  With regards to her breast cancer, Estela is doing very well, and will continue on her tamoxifen as before. I have refilled both her tamoxifen and her Estrace cream per her request. We'll obtain labs today, including a CBC and metabolic panel.  Karlin will return in 6 months for followup in November. She knows to call prior that time, however, with any changes or problems.   Tremane Spurgeon    09/30/2012

## 2012-10-03 ENCOUNTER — Ambulatory Visit: Admitting: Lab

## 2012-10-19 ENCOUNTER — Telehealth: Payer: Self-pay | Admitting: Internal Medicine

## 2012-10-19 NOTE — Telephone Encounter (Signed)
Per CY-lets work patient in for Depo and Nasal Neb. Pt states she would like to come by tomorrow AM if possible; I have placed her on the schedule at 11:15am on Thursday. Nothing more needed. Will sign off on message.

## 2012-10-19 NOTE — Telephone Encounter (Signed)
I spoke with pt and she c/o having drainage all day long, chest tx, facial pressure, ears feel stopped up, cough w. Yellow-green phlem. She saw her PCP yesterday and was advised if we could do a rinse? Per pt she is doing salt water rinses every morning. She is not sure what else to do and she has taking 4 diff ABX per pt. Please advise dr. Maple Hudson thanks Last OV 08/30/12 No pending appt Allergies  Allergen Reactions  . Amoxicillin-Pot Clavulanate Nausea And Vomiting  . Doxycycline Nausea And Vomiting

## 2012-10-20 ENCOUNTER — Encounter: Payer: Self-pay | Admitting: Internal Medicine

## 2012-10-20 ENCOUNTER — Ambulatory Visit (INDEPENDENT_AMBULATORY_CARE_PROVIDER_SITE_OTHER): Admitting: Internal Medicine

## 2012-10-20 VITALS — BP 118/76 | HR 83 | Ht 62.0 in | Wt 153.6 lb

## 2012-10-20 DIAGNOSIS — J0100 Acute maxillary sinusitis, unspecified: Secondary | ICD-10-CM

## 2012-10-20 MED ORDER — EPINEPHRINE 0.3 MG/0.3ML IJ SOAJ: 0.3000 mg | Freq: Once | INTRAMUSCULAR | Status: DC

## 2012-10-20 MED ORDER — CLINDAMYCIN HCL 300 MG PO CAPS: ORAL_CAPSULE | ORAL | Status: DC

## 2012-10-20 MED ORDER — DEXLANSOPRAZOLE 60 MG PO CPDR: 60.0000 mg | DELAYED_RELEASE_CAPSULE | Freq: Every day | ORAL | Status: DC

## 2012-10-20 MED ORDER — METHYLPREDNISOLONE ACETATE 80 MG/ML IJ SUSP
80.0000 mg | Freq: Once | INTRAMUSCULAR | Status: AC
  Administered 2012-10-20: 80 mg via INTRAMUSCULAR

## 2012-10-20 MED ORDER — PHENYLEPHRINE HCL 1 % NA SOLN
3.0000 [drp] | Freq: Once | NASAL | Status: AC
  Administered 2012-10-20: 3 [drp] via NASAL

## 2012-10-20 NOTE — Progress Notes (Signed)
04/28/11- 60 yoF never smoker with a long history of allergic rhinitis, sinusitis and urticaria. Primary care is through [facility].Dr Jana Hakim is her oncologist treating breast cancer with history of left lumpectomy, radiation and tamoxifen. She has had a past history of pneumonia, a diagnosis of asthma and bronchitis noted after treatment for the cancer. She has not had either flu vaccine or pneumonia vaccine. She had been treated by Dr Caprice Red over many years for her allergy complaints and had been on allergy vaccine several times. She was to begin again in November of 2010 but apparently did not do so. With his retirement, she is seeking to change practice. She is complaining of fullness and popping in her ears, heavy sensation in the chest but not much itch, sneeze or drainage. Nasal symptoms are usually perennial, worse in the fall and winter with mold exposure. She has had education on avoidance and control of dust and environmental allergens.  She was  skin tested at age 53 because of sinusitis then. She has given her allergy vaccine or had her husband give it, over many years. They also give her husband's insulin injections. Nasal surgery with septoplasty by Dr. Wilburn Cornelia in 2008. Chest x-ray 03/24/2011 no active disease.  08/13/11- 60 yoF never smoker with a long history of allergic rhinitis, asthma/ bronchitis, sinusitis and urticaria, complicated by past hx breast Ca. Cough producing thick yellow sputum. Symbicort helped but she ran out of the sample. Continues allergy vaccine based on skin testing from another office, with our vaccine made here. She says she really feels it helps, especially her head congestion. CXR 03/24/2011-no active disease, normal. PFT 04/29/2011- WNL, FEV1/FVC 0.74.  06/07/12- 60 yoF never smoker with a long history of allergic rhinitis, asthma/ bronchitis, sinusitis and urticaria, complicated by past hx breast Ca. Follows For: Sinus drainage  (green, thick) - Congestion at night - Using saline rinses - Sinus pressure - Ear pressure - Mild chest tightness CTAcute visit-for the past week has had head congestion and won't drain, some chills last night. Frontal headache. Ears full. Postnasal drip without sore throat. Chest tight. She had dropped off allergy shots. Taking clindamycin 150 mg 4 times daily and using squeeze bottle saline nasal rinse.  07/21/12- 49 yoF never smoker with a long history of allergic rhinitis, asthma/ bronchitis, sinusitis and urticaria, complicated by past hx breast Ca ACUTE VISIT: ? sinus infection, unable to sleep due to congestion in head,. Has never really felt her head cleared since last visit despite 10 days Cleocin/ Sudafed then. . Frontal and maxillary pressure, thick mucus. Got worse again last week. Using saline rinse. Dr Wilburn Cornelia did sinus surgery 2008. CT chest 09/01/11 IMPRESSION:  No CT evidence for thoracic metastasis. No acute abnormality.  Original Report Authenticated By: Arline Asp, M.D.  08/30/12- 38 yoF never smoker with a long history of allergic rhinitis, asthma/ bronchitis, sinusitis and urticaria, complicated by past hx breast Ca FOLLOWS FOR: states she got better since last visit but now the symptoms of nasal drainage-dark green in color have returned. Spectracef worked best- "huge difference" compared with other antibiotics ,but didn't last long enough. CT max/fac 07/25/12 IMPRESSION:  Postsurgical changes as described. Acute and chronic bilateral  maxillary sinusitis. Right frontal sinus fluid could be acute or  chronic.  Original Report Authenticated By: Rolla Flatten, M.D.  10/20/12- 35 yoF never smoker with a long history of allergic rhinitis, asthma/ bronchitis, sinusitis and urticaria, complicated by past hx breast Ca ACUTE VISIT: increased  nasal drainage. Using Nasal rinses, Zyrtec, and Singulair. Here for Depo and Nasal Tx today. NEEDS REFILL FOR DEXILANT  and EPI PEN (90day  supply) Complains of postnasal drip pressure in her ears without popping, frontal headache. Past history of care by Dr. Donzetta Matters HPI Constitutional:   No-   weight loss, night sweats, fevers, chills, fatigue, lassitude. HEENT:   + headaches, difficulty swallowing, tooth/dental problems, sore throat,       No-  sneezing, itching, ear ache, +nasal congestion, +post nasal drip,  CV:  No-   chest pain, orthopnea, PND, swelling in lower extremities, anasarca, dizziness, palpitations Resp: No-   shortness of breath with exertion or at rest.              No-   productive cough,  No non-productive cough,  No- coughing up of blood.              No-   change in color of mucus.  No- wheezing, chest tightness..   Skin: No-   rash or lesions. GI:  No-   heartburn, indigestion, abdominal pain, nausea, vomiting,  GU:  MS:  No-   joint pain or swelling.  . Neuro-     nothing unusual Psych:  No- change in mood or affect. No depression or anxiety.  No memory loss.  OBJ General- Alert, Oriented, Affect-appropriate, Distress- none acute Skin- rash-none, lesions- none, excoriation- none Lymphadenopathy- none Head- atraumatic            Eyes- Gross vision intact, PERRLA, conjunctivae clear secretions            Ears- Hearing, canals- +cerumen non-obstructing R, clear L            Nose- +turbinate edema, no-Septal dev, +mucus beige, polyps, erosion, perforation             Throat- Mallampati II , mucosa clear, drainage- none, tonsils- atrophic Neck- flexible , trachea midline, no stridor , thyroid nl, carotid no bruit Chest - symmetrical excursion , unlabored           Heart/CV- RRR , no murmur , no gallop  , no rub, nl s1 s2                           - JVD- none , edema- none, stasis changes- none, varices- none           Lung- clear, unlabored, wheeze- none, cough-none ,   No-  dullness, rub- none           Chest wall-  Abd-  Br/ Gen/ Rectal- Not done, not indicated Extrem- cyanosis-  none, clubbing, none, atrophy- none, strength- nl Neuro- grossly intact to observation

## 2012-10-20 NOTE — Patient Instructions (Addendum)
Script sent for cleocin antibiotic  Scripts refilling Dexilant and Epipen  Neb neo nasal  Depo 80

## 2012-11-03 NOTE — Assessment & Plan Note (Signed)
Acute exacerbation of chronic sinusitis with eustachian dysfunction Plan-nasal nebulizer decongestant, Cleocin 300 mg x10 days

## 2012-12-05 ENCOUNTER — Ambulatory Visit (INDEPENDENT_AMBULATORY_CARE_PROVIDER_SITE_OTHER): Admitting: Internal Medicine

## 2012-12-05 ENCOUNTER — Encounter: Payer: Self-pay | Admitting: Internal Medicine

## 2012-12-05 ENCOUNTER — Telehealth: Payer: Self-pay | Admitting: Internal Medicine

## 2012-12-05 VITALS — BP 140/80 | HR 92 | Ht 62.5 in | Wt 153.8 lb

## 2012-12-05 DIAGNOSIS — J4541 Moderate persistent asthma with (acute) exacerbation: Secondary | ICD-10-CM

## 2012-12-05 DIAGNOSIS — J45901 Unspecified asthma with (acute) exacerbation: Secondary | ICD-10-CM

## 2012-12-05 DIAGNOSIS — J320 Chronic maxillary sinusitis: Secondary | ICD-10-CM

## 2012-12-05 DIAGNOSIS — J209 Acute bronchitis, unspecified: Secondary | ICD-10-CM

## 2012-12-05 MED ORDER — METHYLPREDNISOLONE ACETATE 80 MG/ML IJ SUSP
80.0000 mg | Freq: Once | INTRAMUSCULAR | Status: AC
  Administered 2012-12-05: 80 mg via INTRAMUSCULAR

## 2012-12-05 MED ORDER — CLINDAMYCIN HCL 150 MG PO CAPS: 150.0000 mg | ORAL_CAPSULE | Freq: Four times a day (QID) | ORAL | Status: DC

## 2012-12-05 MED ORDER — LEVALBUTEROL HCL 0.63 MG/3ML IN NEBU
0.6300 mg | INHALATION_SOLUTION | Freq: Once | RESPIRATORY_TRACT | Status: AC
  Administered 2012-12-05: 0.63 mg via RESPIRATORY_TRACT

## 2012-12-05 NOTE — Patient Instructions (Addendum)
Script sent for cleocin  Ne xop 0.63  Depo 80  Please call as needed

## 2012-12-05 NOTE — Progress Notes (Signed)
04/28/11- [name] never smoker with a long history of allergic rhinitis, sinusitis and urticaria. Primary care is through [facility].Dr Jana Hakim is her oncologist treating breast cancer with history of left lumpectomy, radiation and tamoxifen. She has had a past history of pneumonia, a diagnosis of asthma and bronchitis noted after treatment for the cancer. She has not had either flu vaccine or pneumonia vaccine. She had been treated by Dr Caprice Red over many years for her allergy complaints and had been on allergy vaccine several times. She was to begin again in November of 2010 but apparently did not do so. With his retirement, she is seeking to change practice. She is complaining of fullness and popping in her ears, heavy sensation in the chest but not much itch, sneeze or drainage. Nasal symptoms are usually perennial, worse in the fall and winter with mold exposure. She has had education on avoidance and control of dust and environmental allergens.  She was  skin tested at age 53 because of sinusitis then. She has given her allergy vaccine or had her husband give it, over many years. They also give her husband's insulin injections. Nasal surgery with septoplasty by Dr. Wilburn Cornelia in 2008. Chest x-ray 03/24/2011 no active disease.  08/13/11- [name] never smoker with a long history of allergic rhinitis, asthma/ bronchitis, sinusitis and urticaria, complicated by past hx breast Ca. Cough producing thick yellow sputum. Symbicort helped but she ran out of the sample. Continues allergy vaccine based on skin testing from another office, with our vaccine made here. She says she really feels it helps, especially her head congestion. CXR 03/24/2011-no active disease, normal. PFT 04/29/2011- WNL, FEV1/FVC 0.74.  06/07/12- [name] never smoker with a long history of allergic rhinitis, asthma/ bronchitis, sinusitis and urticaria, complicated by past hx breast Ca. Follows For: Sinus drainage  (green, thick) - Congestion at night - Using saline rinses - Sinus pressure - Ear pressure - Mild chest tightness CTAcute visit-for the past week has had head congestion and won't drain, some chills last night. Frontal headache. Ears full. Postnasal drip without sore throat. Chest tight. She had dropped off allergy shots. Taking clindamycin 150 mg 4 times daily and using squeeze bottle saline nasal rinse.  07/21/12- [name] never smoker with a long history of allergic rhinitis, asthma/ bronchitis, sinusitis and urticaria, complicated by past hx breast Ca ACUTE VISIT: ? sinus infection, unable to sleep due to congestion in head,. Has never really felt her head cleared since last visit despite 10 days Cleocin/ Sudafed then. . Frontal and maxillary pressure, thick mucus. Got worse again last week. Using saline rinse. Dr Wilburn Cornelia did sinus surgery 2008. CT chest 09/01/11 IMPRESSION:  No CT evidence for thoracic metastasis. No acute abnormality.  Original Report Authenticated By: Arline Asp, M.D.  08/30/12- [name] never smoker with a long history of allergic rhinitis, asthma/ bronchitis, sinusitis and urticaria, complicated by past hx breast Ca FOLLOWS FOR: states she got better since last visit but now the symptoms of nasal drainage-dark green in color have returned. Spectracef worked best- "huge difference" compared with other antibiotics ,but didn't last long enough. CT max/fac 07/25/12 IMPRESSION:  Postsurgical changes as described. Acute and chronic bilateral  maxillary sinusitis. Right frontal sinus fluid could be acute or  chronic.  Original Report Authenticated By: Rolla Flatten, M.D.  10/20/12- [name] never smoker with a long history of allergic rhinitis, asthma/ bronchitis, sinusitis and urticaria, complicated by past hx breast Ca ACUTE VISIT: increased  nasal drainage. Using Nasal rinses, Zyrtec, and Singulair. Here for Depo and Nasal Tx today. NEEDS REFILL FOR DEXILANT  and EPI PEN (90day  supply) Complains of postnasal drip pressure in her ears without popping, frontal headache. Past history of care by Dr. Stasia Cavalier  12/05/12- 61 yoF never smoker with a long history of allergic rhinitis,chronic sinusitis,  asthma/ bronchitis,  and urticaria, complicated by past hx breast Ca FOLLOWS FOR: pt reports increased congestion x4 days-- c/o f/c/s, feels congestion has moved from head into chest, prod cough w yellow thick mucus, chest tightness, SOB and wheezing-- using ibuprofen for sx relief. Tolerated Cleocin given June 5 with benefit.relapse in last few days with fever, chills, green sputum from chest, retro-orbital headache. GI okay.  ROS-see HPI Constitutional:   No-   weight loss, night sweats,+ fevers, chills, fatigue, lassitude. HEENT:   + headaches, difficulty swallowing, tooth/dental problems, sore throat,       No-  sneezing, itching, ear ache, +nasal congestion, +post nasal drip,  CV:  No-   chest pain, orthopnea, PND, swelling in lower extremities, anasarca, dizziness, palpitations Resp: No-   shortness of breath with exertion or at rest.              +  productive cough,  No non-productive cough,  No- coughing up of blood.             + change in color of mucus.  No- wheezing, chest tightness..   Skin: No-   rash or lesions. GI:  No-   heartburn, indigestion, abdominal pain, nausea, vomiting,  GU:  MS:  No-   joint pain or swelling.  . Neuro-     nothing unusual Psych:  No- change in mood or affect. No depression or anxiety.  No memory loss.  OBJ General- Alert, Oriented, Affect-appropriate, Distress- none acute Skin- rash-none, lesions- none, excoriation- none Lymphadenopathy- none Head- atraumatic            Eyes- Gross vision intact, PERRLA, conjunctivae clear secretions            Ears- Hearing, canals- +cerumen non-obstructing R, clear L            Nose- +turbinate edema, no-Septal dev, +mucus beige, polyps, erosion, perforation             Throat-  Mallampati II , mucosa clear, drainage- none, tonsils- atrophic Neck- flexible , trachea midline, no stridor , thyroid nl, carotid no bruit Chest - symmetrical excursion , unlabored           Heart/CV- RRR , no murmur , no gallop  , no rub, nl s1 s2                           - JVD- none , edema- none, stasis changes- none, varices- none           Lung- clear, unlabored, wheeze+, cough-none ,   No-  dullness, rub- none           Chest wall-  Abd-  Br/ Gen/ Rectal- Not done, not indicated Extrem- cyanosis- none, clubbing, none, atrophy- none, strength- nl Neuro- grossly intact to observation

## 2012-12-05 NOTE — Telephone Encounter (Signed)
Pt c/o bronchitis/sinus congestion x "at least 4 days" Requests to be seen today by CY  Having prod cough with yellow mucus,wheezing, SOB chest tightness, f/c/s. Sinus congestion, PND x 4 days Clindamycin 300 mg worked well for her last time  Per CY-- patient can be scheduled to come in today at [employer] with patient patient is aware and will come in today to be seen Nothing further at this time

## 2012-12-08 ENCOUNTER — Ambulatory Visit (INDEPENDENT_AMBULATORY_CARE_PROVIDER_SITE_OTHER): Admitting: Internal Medicine

## 2012-12-08 ENCOUNTER — Encounter: Payer: Self-pay | Admitting: Internal Medicine

## 2012-12-08 ENCOUNTER — Telehealth: Payer: Self-pay | Admitting: Internal Medicine

## 2012-12-08 ENCOUNTER — Ambulatory Visit (INDEPENDENT_AMBULATORY_CARE_PROVIDER_SITE_OTHER)
Admission: RE | Admit: 2012-12-08 | Discharge: 2012-12-08 | Disposition: A | Discharge status: Home / Self Care | Source: Ambulatory Visit | Attending: Internal Medicine | Admitting: Internal Medicine

## 2012-12-08 VITALS — BP 142/82 | HR 79 | Ht 62.5 in | Wt 154.6 lb

## 2012-12-08 DIAGNOSIS — J4531 Mild persistent asthma with (acute) exacerbation: Secondary | ICD-10-CM

## 2012-12-08 DIAGNOSIS — J45901 Unspecified asthma with (acute) exacerbation: Secondary | ICD-10-CM

## 2012-12-08 DIAGNOSIS — J4541 Moderate persistent asthma with (acute) exacerbation: Secondary | ICD-10-CM

## 2012-12-08 DIAGNOSIS — J45909 Unspecified asthma, uncomplicated: Secondary | ICD-10-CM | POA: Insufficient documentation

## 2012-12-08 MED ORDER — ARFORMOTEROL TARTRATE 15 MCG/2ML IN NEBU
15.0000 ug | INHALATION_SOLUTION | Freq: Once | RESPIRATORY_TRACT | Status: AC
  Administered 2012-12-08: 15 ug via RESPIRATORY_TRACT

## 2012-12-08 NOTE — Telephone Encounter (Signed)
Spoke with pt and added to CDY's schedule for 3 pm today

## 2012-12-08 NOTE — Progress Notes (Signed)
04/28/11- [name] never smoker with a long history of allergic rhinitis, sinusitis and urticaria. Primary care is through [facility].Dr Jana Hakim is her oncologist treating breast cancer with history of left lumpectomy, radiation and tamoxifen. She has had a past history of pneumonia, a diagnosis of asthma and bronchitis noted after treatment for the cancer. She has not had either flu vaccine or pneumonia vaccine. She had been treated by Dr Caprice Red over many years for her allergy complaints and had been on allergy vaccine several times. She was to begin again in November of 2010 but apparently did not do so. With his retirement, she is seeking to change practice. She is complaining of fullness and popping in her ears, heavy sensation in the chest but not much itch, sneeze or drainage. Nasal symptoms are usually perennial, worse in the fall and winter with mold exposure. She has had education on avoidance and control of dust and environmental allergens.  She was  skin tested at age 53 because of sinusitis then. She has given her allergy vaccine or had her husband give it, over many years. They also give her husband's insulin injections. Nasal surgery with septoplasty by Dr. Wilburn Cornelia in 2008. Chest x-ray 03/24/2011 no active disease.  08/13/11- [name] never smoker with a long history of allergic rhinitis, asthma/ bronchitis, sinusitis and urticaria, complicated by past hx breast Ca. Cough producing thick yellow sputum. Symbicort helped but she ran out of the sample. Continues allergy vaccine based on skin testing from another office, with our vaccine made here. She says she really feels it helps, especially her head congestion. CXR 03/24/2011-no active disease, normal. PFT 04/29/2011- WNL, FEV1/FVC 0.74.  06/07/12- 60 yoF never smoker with a long history of allergic rhinitis, asthma/ bronchitis, sinusitis and urticaria, complicated by past hx breast Ca. Follows For: Sinus drainage  (green, thick) - Congestion at night - Using saline rinses - Sinus pressure - Ear pressure - Mild chest tightness CTAcute visit-for the past week has had head congestion and won't drain, some chills last night. Frontal headache. Ears full. Postnasal drip without sore throat. Chest tight. She had dropped off allergy shots. Taking clindamycin 150 mg 4 times daily and using squeeze bottle saline nasal rinse.  07/21/12- [name] never smoker with a long history of allergic rhinitis, asthma/ bronchitis, sinusitis and urticaria, complicated by past hx breast Ca ACUTE VISIT: ? sinus infection, unable to sleep due to congestion in head,. Has never really felt her head cleared since last visit despite 10 days Cleocin/ Sudafed then. . Frontal and maxillary pressure, thick mucus. Got worse again last week. Using saline rinse. Dr Wilburn Cornelia did sinus surgery 2008. CT chest 09/01/11 IMPRESSION:  No CT evidence for thoracic metastasis. No acute abnormality.  Original Report Authenticated By: Arline Asp, M.D.  08/30/12- [name] never smoker with a long history of allergic rhinitis, asthma/ bronchitis, sinusitis and urticaria, complicated by past hx breast Ca FOLLOWS FOR: states she got better since last visit but now the symptoms of nasal drainage-dark green in color have returned. Spectracef worked best- "huge difference" compared with other antibiotics ,but didn't last long enough. CT max/fac 07/25/12 IMPRESSION:  Postsurgical changes as described. Acute and chronic bilateral  maxillary sinusitis. Right frontal sinus fluid could be acute or  chronic.  Original Report Authenticated By: Rolla Flatten, M.D.  10/20/12- [name] never smoker with a long history of allergic rhinitis, asthma/ bronchitis, sinusitis and urticaria, complicated by past hx breast Ca ACUTE VISIT: increased  nasal drainage. Using Nasal rinses, Zyrtec, and Singulair. Here for Depo and Nasal Tx today. NEEDS REFILL FOR DEXILANT  and EPI PEN (90day  supply) Complains of postnasal drip pressure in her ears without popping, frontal headache. Past history of care by Dr. Stasia Cavalier  12/05/12- [name] never smoker with a long history of allergic rhinitis,chronic sinusitis,  asthma/ bronchitis,  and urticaria, complicated by past hx breast Ca FOLLOWS FOR: pt reports increased congestion x4 days-- c/o f/c/s, feels congestion has moved from head into chest, prod cough w yellow thick mucus, chest tightness, SOB and wheezing-- using ibuprofen for sx relief.  12/08/12- [name] never smoker with a long history of allergic rhinitis,chronic sinusitis,  asthma/ bronchitis,  and urticaria, complicated by past hx breast Ca ACUTE VISIT: seen 12-05-12 continues to have SOB, congestion, and hard time breathing. Wonders if she needs another breathing tx. Continues cleocin started for sinusitis. Got neb and depo here 3 days ago. Continues thick yellow cough. No fever but achey and tired. Asks work LOA for tomorrow.  ROS-see HPI Constitutional:   No-   weight loss, night sweats, fevers, chills, fatigue, lassitude. HEENT:   + headaches, difficulty swallowing, tooth/dental problems, sore throat,       No-  sneezing, itching, ear ache, +nasal congestion, +post nasal drip,  CV:  No-   chest pain, orthopnea, PND, swelling in lower extremities, anasarca, dizziness, palpitations Resp: No-   shortness of breath with exertion or at rest.              +  productive cough,  No non-productive cough,  No- coughing up of blood.              + change in color of mucus.  No- wheezing, chest tightness..   Skin: No-   rash or lesions. GI:  No-   heartburn, indigestion, abdominal pain, nausea, vomiting,  GU:  MS:  No-   joint pain or swelling.  . Neuro-     nothing unusual Psych:  No- change in mood or affect. No depression or anxiety.  No memory loss.  OBJ General- Alert, Oriented, Affect-appropriate, Distress- none acute Skin- rash-none, lesions- none, excoriation-  none Lymphadenopathy- none Head- atraumatic            Eyes- Gross vision intact, PERRLA, conjunctivae clear secretions            Ears- Hearing, canals- +cerumen non-obstructing R, clear L            Nose- +turbinate edema, no-Septal dev, +mucus beige, polyps, erosion, perforation             Throat- Mallampati II , mucosa clear, drainage- none, tonsils- atrophic Neck- flexible , trachea midline, no stridor , thyroid nl, carotid no bruit Chest - symmetrical excursion , unlabored           Heart/CV- RRR , no murmur , no gallop  , no rub, nl s1 s2                           - JVD- none , edema- none, stasis changes- none, varices- none           Lung- +coarse bilateral wheeze, cough+ ,   No-  dullness, rub- none           Chest wall-  Abd-  Br/ Gen/ Rectal- Not done, not indicated Extrem- cyanosis- none, clubbing, none, atrophy- none, strength- nl Neuro- grossly intact  to observation

## 2012-12-08 NOTE — Telephone Encounter (Signed)
Please have patient come in this afternoon in one of the held spots. Thanks.

## 2012-12-08 NOTE — Patient Instructions (Addendum)
Neb- Brovana x 1  Scrip for prednisone to start in the next few days if you aren't getting much better  Finish the Cleocin antibiotic  Order- CXR- dx asthma with bronchitis

## 2012-12-08 NOTE — Telephone Encounter (Signed)
Last OV on 12-05-12. Pt states she is not feeling any improvement in her symptoms. She is still having a productive cough with yellow phlegm, SOB, wheezing, chest tightness. She is taking cleocin that was prescribed. Pt states she feels like she needs another breathing treatment. Please advise. Carron Curie, CMA Allergies  Allergen Reactions  . Amoxicillin-Pot Clavulanate Nausea And Vomiting  . Doxycycline Nausea And Vomiting

## 2012-12-08 NOTE — Assessment & Plan Note (Signed)
Plan- neb Brovana, finish cleocin- may need to change. CXR. Prednisone taper to hold. LOA.

## 2012-12-09 ENCOUNTER — Telehealth: Payer: Self-pay | Admitting: Internal Medicine

## 2012-12-09 MED ORDER — BENZONATATE 200 MG PO CAPS: 200.0000 mg | ORAL_CAPSULE | Freq: Three times a day (TID) | ORAL | Status: DC | PRN

## 2012-12-09 MED ORDER — PREDNISONE 10 MG PO TABS: ORAL_TABLET | ORAL | Status: DC

## 2012-12-09 NOTE — Telephone Encounter (Signed)
Tessalon Perles 200mg  Take 1 q8hr prn #30 x PRN---[facility] Pt aware.

## 2012-12-09 NOTE — Telephone Encounter (Signed)
Pt at pharm right now---Pred taper was not called into pharm at yesterdays visit. Pred 8 day called into pharm for pt  Pt requesting cough suppression med. (syrup or pill) Allergies  Allergen Reactions  . Amoxicillin-Pot Clavulanate Nausea And Vomiting  . Doxycycline Nausea And Vomiting  [facility] [address]  Pt also aware of results of cxr.   Please advise Dr Maple Hudson. Thanks.

## 2012-12-15 ENCOUNTER — Telehealth: Payer: Self-pay | Admitting: Internal Medicine

## 2012-12-15 MED ORDER — CLARITHROMYCIN 500 MG PO TABS: 500.0000 mg | ORAL_TABLET | Freq: Two times a day (BID) | ORAL | Status: DC

## 2012-12-15 NOTE — Telephone Encounter (Signed)
Spoke with pt.  She was seen by CDY on 7.24.  On 7.25, pred taper was sent in.  She will finish pred taper and Cleocin today.  Reports symptoms are better but still not where they should be.  Mucus is just now starting to break up in chest, feels she has more congestion in head, wheezing continues, coughing with thick, light yellow to green mucus, and SOB is unchanged.  Is using symbicort and tessalon with relief.  She is requesting further recs.  ? More prednisone and/or abx? Dr. Maple Hudson, pls advise.  Thank you.  [facility] [address] [place]  Allergies  Allergen Reactions  . Amoxicillin-Pot Clavulanate Nausea And Vomiting  . Doxycycline Nausea And Vomiting    ** If pt doesn't answer work # when calling back, pls call her cell.  Thank you.

## 2012-12-15 NOTE — Telephone Encounter (Signed)
Pt returned call and can be reached @ her work #. [name]

## 2012-12-15 NOTE — Telephone Encounter (Signed)
lmomtcb on pt's named work VM

## 2012-12-15 NOTE — Telephone Encounter (Signed)
Pt asking if CY wanting to renew Pred taper or for her to just take the new ABX.  Pt finishing pred taper today that was Rxd 12/09/12  Pt states that she can take an additional calcium tablet with renewal of Pred if needed--pt states that she has been given this regimen before. Pt states that with the prednisone, a ONCE WEEKLY Calcium supplement was called into the pharmacy.   Please advise Dr Maple Hudson. Thanks.

## 2012-12-15 NOTE — Telephone Encounter (Signed)
Per CY--  I would like to see how she does off the Pred for a little while.   Pt aware of recs per CY.  Pt to call in once week is no better.

## 2012-12-15 NOTE — Telephone Encounter (Signed)
Per CY--  Problem is chronic sinus disease. Try changing from Cleocin to Biaxin 500 Biaxin 500mg  #20  Take 1 bid after meals.   [facility] [address] [address] [place]  Pt aware

## 2012-12-15 NOTE — Telephone Encounter (Signed)
lmtcb x1 

## 2012-12-18 NOTE — Assessment & Plan Note (Signed)
Acute bronchitis and sinusitis Plan-repeat Cleocin with discussion especially about diarrhea, neb xop, Depo-Medrol

## 2012-12-18 NOTE — Assessment & Plan Note (Signed)
Plan-repeat trial of Cleocin since she seemed to respond

## 2012-12-21 ENCOUNTER — Telehealth: Payer: Self-pay | Admitting: Internal Medicine

## 2012-12-21 NOTE — Telephone Encounter (Signed)
Spoke with patient--- Patient states since coming off prednisone x 1 week she feels like her energy is "absolutely gone" Says by 1pm everyday she does not have any energy at all Patient says she started taking abx and mucinex on 8/4 for resp sx and her wheezing has gotten better but energy has not Patient concerned with diff in walking pna and pna--- can this be differentiated on xray? Patient states she has a lot of work to do and a disabled spouse and she needs more energy Dr. Maple Hudson please advise, thank you  Last OV 7/24 Next OV 9/12

## 2012-12-21 NOTE — Telephone Encounter (Signed)
Pt is aware of CY recs.

## 2012-12-21 NOTE — Telephone Encounter (Signed)
This is probably a " bottoming out" after the energy surge from prednisone and should gradually improve. If necessary, I can give samples of Nuvigil to use just for a few days.

## 2012-12-26 ENCOUNTER — Telehealth: Payer: Self-pay | Admitting: Internal Medicine

## 2012-12-26 DIAGNOSIS — J329 Chronic sinusitis, unspecified: Secondary | ICD-10-CM

## 2012-12-26 NOTE — Telephone Encounter (Signed)
Spoke to pt. States that she has finished abx and still has lots of congestion. Reports coughing with little mucus production. Has been using Mucinex extra strength for the past few days with no relief. Denies fever but does feel chilled at times. Wants CY recs.  Allergies  Allergen Reactions  . Amoxicillin-Pot Clavulanate Nausea And Vomiting  . Doxycycline Nausea And Vomiting    CY - please advise. Thanks.

## 2012-12-26 NOTE — Telephone Encounter (Signed)
Pt is aware of CY recs and agrees. Order has been placed for this.

## 2012-12-26 NOTE — Telephone Encounter (Signed)
Her CXr was clear. I would like to repeat limited CT max fac for acute and chronic sinusitis.

## 2012-12-27 ENCOUNTER — Ambulatory Visit (INDEPENDENT_AMBULATORY_CARE_PROVIDER_SITE_OTHER)
Admission: RE | Admit: 2012-12-27 | Discharge: 2012-12-27 | Disposition: A | Discharge status: Home / Self Care | Source: Ambulatory Visit | Attending: Internal Medicine | Admitting: Internal Medicine

## 2012-12-27 DIAGNOSIS — J329 Chronic sinusitis, unspecified: Secondary | ICD-10-CM

## 2012-12-28 ENCOUNTER — Other Ambulatory Visit

## 2012-12-28 ENCOUNTER — Telehealth: Payer: Self-pay | Admitting: Internal Medicine

## 2012-12-28 DIAGNOSIS — J329 Chronic sinusitis, unspecified: Secondary | ICD-10-CM

## 2012-12-28 DIAGNOSIS — J019 Acute sinusitis, unspecified: Secondary | ICD-10-CM

## 2012-12-28 NOTE — Progress Notes (Signed)
Quick Note:  Pt aware of results via phone note 12-28-12(mindy spoke with patient). ______

## 2012-12-28 NOTE — Telephone Encounter (Signed)
Result Notes    Notes Recorded by Waymon Budge, MD on 12/27/2012 at 5:09 PM CT sinus- Improvement in sinusitis in the maxillary sinuses under the cheek bones, but worsening sinusitis in the left frontal sinus- over left eye. I would like to refer her back to f/u with [name] ENT for acute and chronic sinusitis.   I spoke with patient about results and she verbalized understanding and had no questions. Referral placed

## 2012-12-29 ENCOUNTER — Ambulatory Visit (INDEPENDENT_AMBULATORY_CARE_PROVIDER_SITE_OTHER): Admitting: Internal Medicine

## 2012-12-29 ENCOUNTER — Encounter: Payer: Self-pay | Admitting: *Deleted

## 2012-12-29 ENCOUNTER — Encounter: Payer: Self-pay | Admitting: Internal Medicine

## 2012-12-29 VITALS — BP 139/83 | HR 101 | Ht 62.0 in | Wt 157.2 lb

## 2012-12-29 DIAGNOSIS — J45901 Unspecified asthma with (acute) exacerbation: Secondary | ICD-10-CM

## 2012-12-29 DIAGNOSIS — J320 Chronic maxillary sinusitis: Secondary | ICD-10-CM

## 2012-12-29 MED ORDER — CLARITHROMYCIN 500 MG PO TABS: ORAL_TABLET | ORAL | Status: DC

## 2012-12-29 MED ORDER — FLUTICASONE FUROATE-VILANTEROL 100-25 MCG/INH IN AEPB: 1.0000 | INHALATION_SPRAY | Freq: Every day | RESPIRATORY_TRACT | Status: DC

## 2012-12-29 NOTE — Patient Instructions (Addendum)
Neb xop 0.63  Depo 80  Sample and script Breo Ellipta    1 puff then rinse mouth, one time daily   Try this instead of Symbicort  Script for Biaxin to take for a total of a month  Script for prednisone taper  Note for work to work half days x 1 week

## 2012-12-29 NOTE — Progress Notes (Signed)
04/28/11- [name] never smoker with a long history of allergic rhinitis, sinusitis and urticaria. Primary care is through [facility].Dr Jana Hakim is her oncologist treating breast cancer with history of left lumpectomy, radiation and tamoxifen. She has had a past history of pneumonia, a diagnosis of asthma and bronchitis noted after treatment for the cancer. She has not had either flu vaccine or pneumonia vaccine. She had been treated by Dr Caprice Red over many years for her allergy complaints and had been on allergy vaccine several times. She was to begin again in November of 2010 but apparently did not do so. With his retirement, she is seeking to change practice. She is complaining of fullness and popping in her ears, heavy sensation in the chest but not much itch, sneeze or drainage. Nasal symptoms are usually perennial, worse in the fall and winter with mold exposure. She has had education on avoidance and control of dust and environmental allergens.  She was  skin tested at age 53 because of sinusitis then. She has given her allergy vaccine or had her husband give it, over many years. They also give her husband's insulin injections. Nasal surgery with septoplasty by Dr. Wilburn Cornelia in 2008. Chest x-ray 03/24/2011 no active disease.  08/13/11- [name] never smoker with a long history of allergic rhinitis, asthma/ bronchitis, sinusitis and urticaria, complicated by past hx breast Ca. Cough producing thick yellow sputum. Symbicort helped but she ran out of the sample. Continues allergy vaccine based on skin testing from another office, with our vaccine made here. She says she really feels it helps, especially her head congestion. CXR 03/24/2011-no active disease, normal. PFT 04/29/2011- WNL, FEV1/FVC 0.74.  06/07/12- [name] never smoker with a long history of allergic rhinitis, asthma/ bronchitis, sinusitis and urticaria, complicated by past hx breast Ca. Follows For: Sinus drainage  (green, thick) - Congestion at night - Using saline rinses - Sinus pressure - Ear pressure - Mild chest tightness CTAcute visit-for the past week has had head congestion and won't drain, some chills last night. Frontal headache. Ears full. Postnasal drip without sore throat. Chest tight. She had dropped off allergy shots. Taking clindamycin 150 mg 4 times daily and using squeeze bottle saline nasal rinse.  07/21/12- [name] never smoker with a long history of allergic rhinitis, asthma/ bronchitis, sinusitis and urticaria, complicated by past hx breast Ca ACUTE VISIT: ? sinus infection, unable to sleep due to congestion in head,. Has never really felt her head cleared since last visit despite 10 days Cleocin/ Sudafed then. . Frontal and maxillary pressure, thick mucus. Got worse again last week. Using saline rinse. Dr Wilburn Cornelia did sinus surgery 2008. CT chest 09/01/11 IMPRESSION:  No CT evidence for thoracic metastasis. No acute abnormality.  Original Report Authenticated By: Arline Asp, M.D.  08/30/12- [name] never smoker with a long history of allergic rhinitis, asthma/ bronchitis, sinusitis and urticaria, complicated by past hx breast Ca FOLLOWS FOR: states she got better since last visit but now the symptoms of nasal drainage-dark green in color have returned. Spectracef worked best- "huge difference" compared with other antibiotics ,but didn't last long enough. CT max/fac 07/25/12 IMPRESSION:  Postsurgical changes as described. Acute and chronic bilateral  maxillary sinusitis. Right frontal sinus fluid could be acute or  chronic.  Original Report Authenticated By: Rolla Flatten, M.D.  10/20/12- [name] never smoker with a long history of allergic rhinitis, asthma/ bronchitis, sinusitis and urticaria, complicated by past hx breast Ca ACUTE VISIT: increased  nasal drainage. Using Nasal rinses, Zyrtec, and Singulair. Here for Depo and Nasal Tx today. NEEDS REFILL FOR DEXILANT  and EPI PEN (90day  supply) Complains of postnasal drip pressure in her ears without popping, frontal headache. Past history of care by Dr. Stasia Cavalier  12/05/12- 61 yoF never smoker with a long history of allergic rhinitis,chronic sinusitis,  asthma/ bronchitis,  and urticaria, complicated by past hx breast Ca FOLLOWS FOR: pt reports increased congestion x4 days-- c/o f/c/s, feels congestion has moved from head into chest, prod cough w yellow thick mucus, chest tightness, SOB and wheezing-- using ibuprofen for sx relief.  12/08/12- 61 yoF never smoker with a long history of allergic rhinitis,chronic sinusitis,  asthma/ bronchitis,  and urticaria, complicated by past hx breast Ca ACUTE VISIT: seen 12-05-12 continues to have SOB, congestion, and hard time breathing. Wonders if she needs another breathing tx. Continues cleocin started for sinusitis. Got neb and depo here 3 days ago. Continues thick yellow cough. No fever but achey and tired. Asks work LOA for tomorrow.  12/29/12- 61 yoF never smoker with a long history of allergic rhinitis,chronic sinusitis,  asthma/ bronchitis,  and urticaria, complicated by past hx breast Ca/ L lumpectomy ACUTE VISIT: continues to cough and wheeze; non productive mostly. Had 2 rounds of Cleocin. Then Biaxin did seem to help. Cough is now nonproductive, with wheeze. Ran out of Symbicort. CXR 12/08/12 IMPRESSION:  No evidence of acute cardiopulmonary disease.  Original Report Authenticated By: Charline Bills, M.D. CT max fac 12/28/12 IMPRESSION:  Postsurgical changes as described.  Improved bilateral maxillary sinus disease.  Worsening left frontal sinus disease.  Original Report Authenticated By: Davonna Belling, M.D. /  ROS-see HPI Constitutional:   No-   weight loss, night sweats, fevers, chills, fatigue, lassitude. HEENT:   + headaches, difficulty swallowing, tooth/dental problems, sore throat,       No-  sneezing, itching, ear ache, +nasal congestion, +post nasal drip,  CV:   No-   chest pain, orthopnea, PND, swelling in lower extremities, anasarca, dizziness, palpitations Resp: No-   shortness of breath with exertion or at rest.              No- productive cough, + non-productive cough,  No- coughing up of blood.             No- change in color of mucus.  + wheezing, chest tightness..   Skin: No-   rash or lesions. GI:  No-   heartburn, indigestion, abdominal pain, nausea, vomiting,  GU:  MS:  No-   joint pain or swelling.  . Neuro-     nothing unusual Psych:  No- change in mood or affect. No depression or anxiety.  No memory loss.  OBJ General- Alert, Oriented, Affect-appropriate, Distress- none acute Skin- rash-none, lesions- none, excoriation- none Lymphadenopathy- none Head- atraumatic            Eyes- Gross vision intact, PERRLA, conjunctivae clear secretions            Ears- Hearing, canals- +cerumen non-obstructing R, clear L            Nose- +turbinate edema, no-Septal dev, polyps, erosion, perforation             Throat- Mallampati II , mucosa clear, drainage- none, tonsils- atrophic Neck- flexible , trachea midline, no stridor , thyroid nl, carotid no bruit Chest - symmetrical excursion , unlabored           Heart/CV- RRR , no murmur ,  no gallop  , no rub, nl s1 s2                           - JVD- none , edema- none, stasis changes- none, varices- none           Lung- +light bilateral wheeze, cough+ ,   No-  dullness, rub- none           Chest wall-  Abd-  Br/ Gen/ Rectal- Not done, not indicated Extrem- cyanosis- none, clubbing, none, atrophy- none, strength- nl Neuro- grossly intact to observation

## 2012-12-30 ENCOUNTER — Ambulatory Visit: Admitting: Internal Medicine

## 2013-01-16 NOTE — Assessment & Plan Note (Signed)
Becoming a chronic bronchitis. It does seem linked to her sinusitis Plan-work note-half days for the next week. Biaxin x2 weeks with one refill. Change Symbicort to [employer]

## 2013-01-16 NOTE — Assessment & Plan Note (Signed)
She seemed to respond to Biaxin so we're going to give an extended course. If she can't state clear water to see ENT. I think if we can keep her upper airway clear, her lower airways would do better.

## 2013-01-27 ENCOUNTER — Ambulatory Visit: Admitting: Internal Medicine

## 2013-02-13 ENCOUNTER — Other Ambulatory Visit: Payer: Self-pay | Admitting: Otolaryngology

## 2013-02-13 NOTE — Addendum Note (Signed)
Addended by: Annalee Genta, Rayne Cowdrey on: 02/13/2013 11:59 AM   Modules accepted: Orders

## 2013-02-15 ENCOUNTER — Encounter (HOSPITAL_COMMUNITY)
Admission: RE | Admit: 2013-02-15 | Discharge: 2013-02-15 | Disposition: A | Discharge status: Home / Self Care | Source: Ambulatory Visit | Attending: Otolaryngology | Admitting: Otolaryngology

## 2013-02-15 ENCOUNTER — Encounter (HOSPITAL_COMMUNITY): Payer: Self-pay

## 2013-02-15 HISTORY — DX: Pneumonia, unspecified organism: J18.9

## 2013-02-15 HISTORY — DX: Other specified postprocedural states: Z98.890

## 2013-02-15 HISTORY — DX: Gastro-esophageal reflux disease without esophagitis: K21.9

## 2013-02-15 HISTORY — DX: Headache: R51

## 2013-02-15 HISTORY — DX: Nausea with vomiting, unspecified: R11.2

## 2013-02-15 HISTORY — DX: Dizziness and giddiness: R42

## 2013-02-15 HISTORY — DX: Shortness of breath: R06.02

## 2013-02-15 HISTORY — DX: Bronchitis, not specified as acute or chronic: J40

## 2013-02-15 HISTORY — DX: Unspecified glaucoma: H40.9

## 2013-02-15 LAB — CBC
HCT: 40.6 % (ref 36.0–46.0)
Hemoglobin: 14 g/dL (ref 12.0–15.0)
MCH: 31 pg (ref 26.0–34.0)
MCHC: 34.5 g/dL (ref 30.0–36.0)

## 2013-02-15 LAB — BASIC METABOLIC PANEL
BUN: 14 mg/dL (ref 6–23)
CO2: 28 mEq/L (ref 19–32)
Calcium: 8.7 mg/dL (ref 8.4–10.5)
Creatinine, Ser: 0.82 mg/dL (ref 0.50–1.10)
GFR calc non Af Amer: 76 mL/min — ABNORMAL LOW (ref >90)
Glucose, Bld: 120 mg/dL — ABNORMAL HIGH (ref 70–99)
Potassium: 3.4 mEq/L — ABNORMAL LOW (ref 3.5–5.1)

## 2013-02-15 MED ORDER — CEFAZOLIN SODIUM-DEXTROSE 2-3 GM-% IV SOLR
2.0000 g | INTRAVENOUS | Status: AC
  Administered 2013-02-16: 2 g via INTRAVENOUS
  Filled 2013-02-15: qty 50

## 2013-02-15 NOTE — Pre-Procedure Instructions (Signed)
[name]  02/15/2013   Your procedure is scheduled on:  Friday, October 3rd  Report to Main Entrance "A" at 0530 AM.  Call this number if you have problems the morning of surgery: [phone]   Remember:   Do not eat food or drink liquids after midnight.   Take these medicines the morning of surgery with A SIP OF WATER: lexapro, dexilant, nasonex, eye drops, symbicort   Do not wear jewelry, make-up or nail polish.  Do not wear lotions, powders, or perfumes. You may wear deodorant.  Do not shave 48 hours prior to surgery. Men may shave face and neck.  Do not bring valuables to the hospital.  [facility] is not responsible for any belongings or valuables.               Contacts, dentures or bridgework may not be worn into surgery.  Leave suitcase in the car. After surgery it may be brought to your room.  For patients admitted to the hospital, discharge time is determined by your   treatment team.               Patients discharged the day of surgery will not be allowed to drive home.   Special Instructions: Shower using CHG 2 nights before surgery and the night before surgery.  If you shower the day of surgery use CHG.  Use special wash - you have one bottle of CHG for all showers.  You should use approximately 1/3 of the bottle for each shower.   Please read over the following fact sheets that you were given: Pain Booklet, Coughing and Deep Breathing and Surgical Site Infection Prevention

## 2013-02-15 NOTE — Progress Notes (Signed)
Primary physician - Dr. Hollice Espy Stress test approximately 5 years ago - completely normal No cardiologist

## 2013-02-16 ENCOUNTER — Ambulatory Visit (HOSPITAL_COMMUNITY): Admitting: Anesthesiology

## 2013-02-16 ENCOUNTER — Encounter (HOSPITAL_COMMUNITY): Payer: Self-pay | Admitting: Anesthesiology

## 2013-02-16 ENCOUNTER — Encounter (HOSPITAL_COMMUNITY)
Admission: RE | Disposition: A | Discharge status: Home / Self Care | Payer: Self-pay | Source: Ambulatory Visit | Attending: Otolaryngology

## 2013-02-16 ENCOUNTER — Encounter (HOSPITAL_COMMUNITY): Payer: Self-pay | Admitting: *Deleted

## 2013-02-16 ENCOUNTER — Ambulatory Visit (HOSPITAL_COMMUNITY)
Admission: RE | Admit: 2013-02-16 | Discharge: 2013-02-16 | Disposition: A | Discharge status: Home / Self Care | Source: Ambulatory Visit | Attending: Otolaryngology | Admitting: Otolaryngology

## 2013-02-16 DIAGNOSIS — Z01812 Encounter for preprocedural laboratory examination: Secondary | ICD-10-CM | POA: Insufficient documentation

## 2013-02-16 DIAGNOSIS — Z9889 Other specified postprocedural states: Secondary | ICD-10-CM | POA: Insufficient documentation

## 2013-02-16 DIAGNOSIS — Z01810 Encounter for preprocedural cardiovascular examination: Secondary | ICD-10-CM | POA: Insufficient documentation

## 2013-02-16 DIAGNOSIS — Z853 Personal history of malignant neoplasm of breast: Secondary | ICD-10-CM | POA: Insufficient documentation

## 2013-02-16 DIAGNOSIS — Z79899 Other long term (current) drug therapy: Secondary | ICD-10-CM | POA: Insufficient documentation

## 2013-02-16 DIAGNOSIS — J330 Polyp of nasal cavity: Secondary | ICD-10-CM | POA: Insufficient documentation

## 2013-02-16 DIAGNOSIS — J329 Chronic sinusitis, unspecified: Secondary | ICD-10-CM | POA: Diagnosis present

## 2013-02-16 HISTORY — PX: SINUS ENDO W/FUSION: SHX777

## 2013-02-16 SURGERY — SINUS SURGERY, ENDOSCOPIC, USING COMPUTER-ASSISTED NAVIGATION
Anesthesia: General | Site: Nose | Laterality: Bilateral | Wound class: Clean Contaminated

## 2013-02-16 MED ORDER — TRIAMCINOLONE ACETONIDE 40 MG/ML IJ SUSP
INTRAMUSCULAR | Status: DC | PRN
  Administered 2013-02-16: 40 mg via INTRAMUSCULAR

## 2013-02-16 MED ORDER — ROCURONIUM BROMIDE 100 MG/10ML IV SOLN
INTRAVENOUS | Status: DC | PRN
  Administered 2013-02-16: 40 mg via INTRAVENOUS

## 2013-02-16 MED ORDER — NEOSTIGMINE METHYLSULFATE 1 MG/ML IJ SOLN
INTRAMUSCULAR | Status: DC | PRN
  Administered 2013-02-16: 3 mg via INTRAVENOUS

## 2013-02-16 MED ORDER — OXYCODONE HCL 5 MG PO TABS: 5.0000 mg | ORAL_TABLET | Freq: Once | ORAL | Status: DC | PRN

## 2013-02-16 MED ORDER — LACTATED RINGERS IV SOLN
INTRAVENOUS | Status: DC | PRN
  Administered 2013-02-16 (×2): via INTRAVENOUS

## 2013-02-16 MED ORDER — OXYMETAZOLINE HCL 0.05 % NA SOLN
NASAL | Status: AC
  Filled 2013-02-16: qty 15

## 2013-02-16 MED ORDER — MUPIROCIN CALCIUM 2 % EX CREA
TOPICAL_CREAM | CUTANEOUS | Status: DC | PRN
  Administered 2013-02-16: 1 via TOPICAL

## 2013-02-16 MED ORDER — LIDOCAINE HCL (CARDIAC) 20 MG/ML IV SOLN
INTRAVENOUS | Status: DC | PRN
  Administered 2013-02-16: 100 mg via INTRAVENOUS

## 2013-02-16 MED ORDER — FENTANYL CITRATE 0.05 MG/ML IJ SOLN
INTRAMUSCULAR | Status: DC | PRN
  Administered 2013-02-16 (×3): 50 ug via INTRAVENOUS
  Administered 2013-02-16: 100 ug via INTRAVENOUS

## 2013-02-16 MED ORDER — MIDAZOLAM HCL 2 MG/2ML IJ SOLN: 1.0000 mg | INTRAMUSCULAR | Status: DC | PRN

## 2013-02-16 MED ORDER — DEXAMETHASONE SODIUM PHOSPHATE 10 MG/ML IJ SOLN
INTRAMUSCULAR | Status: AC
  Filled 2013-02-16: qty 1

## 2013-02-16 MED ORDER — FENTANYL CITRATE 0.05 MG/ML IJ SOLN
INTRAMUSCULAR | Status: AC
  Filled 2013-02-16: qty 2

## 2013-02-16 MED ORDER — OXYCODONE HCL 5 MG/5ML PO SOLN: 5.0000 mg | Freq: Once | ORAL | Status: DC | PRN

## 2013-02-16 MED ORDER — SODIUM CHLORIDE 0.9 % IR SOLN
Status: DC | PRN
  Administered 2013-02-16: 1000 mL

## 2013-02-16 MED ORDER — MUPIROCIN CALCIUM 2 % EX CREA
TOPICAL_CREAM | CUTANEOUS | Status: AC
  Filled 2013-02-16: qty 15

## 2013-02-16 MED ORDER — ARTIFICIAL TEARS OP OINT
TOPICAL_OINTMENT | OPHTHALMIC | Status: DC | PRN
  Administered 2013-02-16: 1 via OPHTHALMIC

## 2013-02-16 MED ORDER — OXYMETAZOLINE HCL 0.05 % NA SOLN
NASAL | Status: DC | PRN
  Administered 2013-02-16 (×3): 1 via NASAL

## 2013-02-16 MED ORDER — MIDAZOLAM HCL 5 MG/5ML IJ SOLN
INTRAMUSCULAR | Status: DC | PRN
  Administered 2013-02-16: 2 mg via INTRAVENOUS

## 2013-02-16 MED ORDER — GLYCOPYRROLATE 0.2 MG/ML IJ SOLN
INTRAMUSCULAR | Status: DC | PRN
  Administered 2013-02-16: .5 mg via INTRAVENOUS

## 2013-02-16 MED ORDER — FENTANYL CITRATE 0.05 MG/ML IJ SOLN: 50.0000 ug | Freq: Once | INTRAMUSCULAR | Status: DC

## 2013-02-16 MED ORDER — PROMETHAZINE HCL 25 MG/ML IJ SOLN
INTRAMUSCULAR | Status: AC
  Filled 2013-02-16: qty 1

## 2013-02-16 MED ORDER — PROPOFOL 10 MG/ML IV BOLUS
INTRAVENOUS | Status: DC | PRN
  Administered 2013-02-16: 180 mg via INTRAVENOUS
  Administered 2013-02-16: 20 mg via INTRAVENOUS

## 2013-02-16 MED ORDER — ONDANSETRON HCL 4 MG/2ML IJ SOLN
INTRAMUSCULAR | Status: DC | PRN
  Administered 2013-02-16: 4 mg via INTRAVENOUS

## 2013-02-16 MED ORDER — LIDOCAINE-EPINEPHRINE 1 %-1:100000 IJ SOLN
INTRAMUSCULAR | Status: DC | PRN
  Administered 2013-02-16: 20 mL

## 2013-02-16 MED ORDER — FENTANYL CITRATE 0.05 MG/ML IJ SOLN
25.0000 ug | INTRAMUSCULAR | Status: DC | PRN
  Administered 2013-02-16 (×2): 50 ug via INTRAVENOUS

## 2013-02-16 MED ORDER — PROMETHAZINE HCL 25 MG/ML IJ SOLN
6.2500 mg | INTRAMUSCULAR | Status: DC | PRN
  Administered 2013-02-16: 6.25 mg via INTRAVENOUS

## 2013-02-16 MED ORDER — HYDROCODONE-ACETAMINOPHEN 5-325 MG PO TABS: 1.0000 | ORAL_TABLET | Freq: Four times a day (QID) | ORAL | Status: DC | PRN

## 2013-02-16 MED ORDER — LEVOFLOXACIN 500 MG PO TABS: 500.0000 mg | ORAL_TABLET | Freq: Every day | ORAL | Status: DC

## 2013-02-16 MED ORDER — LIDOCAINE-EPINEPHRINE 1 %-1:100000 IJ SOLN
INTRAMUSCULAR | Status: AC
  Filled 2013-02-16: qty 1

## 2013-02-16 MED ORDER — PHENYLEPHRINE HCL 10 MG/ML IJ SOLN
INTRAMUSCULAR | Status: DC | PRN
  Administered 2013-02-16 (×3): 80 ug via INTRAVENOUS
  Administered 2013-02-16: 120 ug via INTRAVENOUS
  Administered 2013-02-16: 80 ug via INTRAVENOUS

## 2013-02-16 MED ORDER — TRIAMCINOLONE ACETONIDE 40 MG/ML IJ SUSP
INTRAMUSCULAR | Status: AC
  Filled 2013-02-16: qty 5

## 2013-02-16 MED ORDER — 0.9 % SODIUM CHLORIDE (POUR BTL) OPTIME
TOPICAL | Status: DC | PRN
  Administered 2013-02-16: 1000 mL

## 2013-02-16 MED ORDER — DEXAMETHASONE SODIUM PHOSPHATE 10 MG/ML IJ SOLN
10.0000 mg | Freq: Once | INTRAMUSCULAR | Status: AC
  Administered 2013-02-16: 10 mg via INTRAVENOUS

## 2013-02-16 SURGICAL SUPPLY — 44 items
BLADE RAD60 ROTATE M4 4 5PK (BLADE) ×1 IMPLANT
BLADE ROTATE RAD 40 4 M4 (BLADE) ×1 IMPLANT
BLADE ROTATE TRICUT 4X13 M4 (BLADE) ×2 IMPLANT
CANISTER SUCTION 2500CC (MISCELLANEOUS) ×4 IMPLANT
DRESSING NASAL KENNEDY 3.5X.9 (MISCELLANEOUS) IMPLANT
DRSG NASAL KENNEDY 3.5X.9 (MISCELLANEOUS)
ELECT COATED BLADE 2.86 ST (ELECTRODE) ×1 IMPLANT
ELECT REM PT RETURN 9FT ADLT (ELECTROSURGICAL) ×2
ELECTRODE REM PT RTRN 9FT ADLT (ELECTROSURGICAL) ×1 IMPLANT
FILTER ARTHROSCOPY CONVERTOR (FILTER) ×1 IMPLANT
GLOVE BIO SURGEON STRL SZ 6 (GLOVE) ×1 IMPLANT
GLOVE BIOGEL M 7.0 STRL (GLOVE) ×4 IMPLANT
GLOVE BIOGEL PI IND STRL 6.5 (GLOVE) IMPLANT
GLOVE BIOGEL PI IND STRL 7.0 (GLOVE) IMPLANT
GLOVE BIOGEL PI INDICATOR 6.5 (GLOVE) ×1
GLOVE BIOGEL PI INDICATOR 7.0 (GLOVE) ×1
GLOVE SURG SS PI 6.5 STRL IVOR (GLOVE) ×2 IMPLANT
GOWN STRL NON-REIN LRG LVL3 (GOWN DISPOSABLE) ×6 IMPLANT
KIT BASIN OR (CUSTOM PROCEDURE TRAY) ×2 IMPLANT
KIT ROOM TURNOVER OR (KITS) ×2 IMPLANT
MARKER SKIN DUAL TIP RULER LAB (MISCELLANEOUS) ×1 IMPLANT
NS IRRIG 1000ML POUR BTL (IV SOLUTION) ×2 IMPLANT
PAD ARMBOARD 7.5X6 YLW CONV (MISCELLANEOUS) ×4 IMPLANT
PAD ENT ADHESIVE 25PK (MISCELLANEOUS) ×2 IMPLANT
PENCIL BUTTON HOLSTER BLD 10FT (ELECTRODE) IMPLANT
SPECIMEN JAR SMALL (MISCELLANEOUS) ×1 IMPLANT
SPONGE NEURO XRAY DETECT 1X3 (DISPOSABLE) ×3 IMPLANT
SUT ETHILON 3 0 FSL (SUTURE) IMPLANT
SUT PLAIN 4 0 ~~LOC~~ 1 (SUTURE) IMPLANT
SWAB COLLECTION DEVICE MRSA (MISCELLANEOUS) ×1 IMPLANT
SYR 5ML LL (SYRINGE) IMPLANT
SYR CONTROL 10ML LL (SYRINGE) ×1 IMPLANT
TOWEL OR 17X24 6PK STRL BLUE (TOWEL DISPOSABLE) ×2 IMPLANT
TOWEL OR 17X26 10 PK STRL BLUE (TOWEL DISPOSABLE) ×2 IMPLANT
TRACKER ENT INSTRUMENT (MISCELLANEOUS) ×2 IMPLANT
TRACKER ENT PATIENT (MISCELLANEOUS) ×2 IMPLANT
TRAP SPECIMEN MUCOUS 40CC (MISCELLANEOUS) ×1 IMPLANT
TRAY ENT MC OR (CUSTOM PROCEDURE TRAY) ×2 IMPLANT
TUBE ANAEROBIC SPECIMEN COL (MISCELLANEOUS) ×1 IMPLANT
TUBE CONNECTING 12X1/4 (SUCTIONS) ×4 IMPLANT
TUBING EXTENTION W/L.L. (IV SETS) ×2 IMPLANT
TUBING STRAIGHTSHOT EPS 5PK (TUBING) ×2 IMPLANT
WATER STERILE IRR 1000ML POUR (IV SOLUTION) ×2 IMPLANT
WIPE INSTRUMENT VISIWIPE 73X73 (MISCELLANEOUS) ×2 IMPLANT

## 2013-02-16 NOTE — Anesthesia Procedure Notes (Signed)
Procedure Name: Intubation Date/Time: 02/16/2013 7:47 AM Performed by: Quentin Ore Pre-anesthesia Checklist: Patient identified, Emergency Drugs available, Suction available, Patient being monitored and Timeout performed Patient Re-evaluated:Patient Re-evaluated prior to inductionOxygen Delivery Method: Circle system utilized Preoxygenation: Pre-oxygenation with 100% oxygen Intubation Type: IV induction Ventilation: Mask ventilation without difficulty Laryngoscope Size: Mac and 3 Grade View: Grade I Tube type: Oral Tube size: 7.0 mm Number of attempts: 1 Airway Equipment and Method: Stylet Placement Confirmation: ETT inserted through vocal cords under direct vision,  positive ETCO2 and breath sounds checked- equal and bilateral Secured at: 21 cm Tube secured with: Tape Dental Injury: Teeth and Oropharynx as per pre-operative assessment

## 2013-02-16 NOTE — Preoperative (Signed)
Beta Blockers   Reason not to administer Beta Blockers:Not Applicable 

## 2013-02-16 NOTE — Transfer of Care (Signed)
Immediate Anesthesia Transfer of Care Note  Patient: [name]  Procedure(s) Performed: Procedure(s): ENDOSCOPIC SINUS SURGERY WITH FUSION NAVIGATION (Bilateral)  Patient Location: PACU  Anesthesia Type:General  Level of Consciousness: awake, alert  and oriented  Airway & Oxygen Therapy: Patient Spontanous Breathing and Patient connected to face mask oxygen  Post-op Assessment: Report given to PACU RN, Post -op Vital signs reviewed and stable and Patient moving all extremities X 4  Post vital signs: Reviewed and stable  Complications: No apparent anesthesia complications

## 2013-02-16 NOTE — Anesthesia Preprocedure Evaluation (Addendum)
Anesthesia Evaluation  Patient identified by MRN, date of birth, ID band Patient awake    Reviewed: Allergy & Precautions, H&P , NPO status , Patient's Chart, lab work & pertinent test results  History of Anesthesia Complications (+) PONV  Airway Mallampati: I TM Distance: >3 FB Neck ROM: Full    Dental  (+) Teeth Intact   Pulmonary shortness of breath, asthma ,  breath sounds clear to auscultation        Cardiovascular Rhythm:Regular Rate:Normal     Neuro/Psych  Headaches,    GI/Hepatic GERD-  ,  Endo/Other    Renal/GU      Musculoskeletal   Abdominal   Peds  Hematology   Anesthesia Other Findings Breast ca  Reproductive/Obstetrics                          Anesthesia Physical Anesthesia Plan  ASA: II  Anesthesia Plan: General   Post-op Pain Management:    Induction: Intravenous  Airway Management Planned: Oral ETT  Additional Equipment:   Intra-op Plan:   Post-operative Plan: Extubation in OR  Informed Consent: I have reviewed the patients History and Physical, chart, labs and discussed the procedure including the risks, benefits and alternatives for the proposed anesthesia with the patient or authorized representative who has indicated his/her understanding and acceptance.     Plan Discussed with: CRNA and Surgeon  Anesthesia Plan Comments:         Anesthesia Quick Evaluation

## 2013-02-16 NOTE — Anesthesia Postprocedure Evaluation (Signed)
  Anesthesia Post-op Note  Patient: [name]  Procedure(s) Performed: Procedure(s): ENDOSCOPIC SINUS SURGERY WITH FUSION NAVIGATION (Bilateral)  Patient Location: PACU  Anesthesia Type:General  Level of Consciousness: awake and alert   Airway and Oxygen Therapy: Patient Spontanous Breathing  Post-op Pain: mild  Post-op Assessment: Post-op Vital signs reviewed, Patient's Cardiovascular Status Stable, Respiratory Function Stable, Patent Airway, No signs of Nausea or vomiting and Pain level controlled  Post-op Vital Signs: Reviewed and stable  Complications: No apparent anesthesia complications

## 2013-02-16 NOTE — Brief Op Note (Signed)
02/16/2013  9:38 AM  PATIENT:  [name]  62 y.o. female  PRE-OPERATIVE DIAGNOSIS:  CHRONIC SINUSITIS  POST-OPERATIVE DIAGNOSIS:  CHRONIC SINUSITIS  PROCEDURE:  Procedure(s): ENDOSCOPIC SINUS SURGERY WITH FUSION NAVIGATION (Bilateral)  SURGEON:  Surgeon(s) and Role:    * Osborn Coho, MD - Primary  PHYSICIAN ASSISTANT:   ASSISTANTS: none   ANESTHESIA:   general  EBL:  Total I/O In: 1000 [I.V.:1000] Out: - 100  BLOOD ADMINISTERED:none  DRAINS: none   LOCAL MEDICATIONS USED:  LIDOCAINE  and Amount: 5 ml  SPECIMEN:  Source of Specimen:  sinus contents  DISPOSITION OF SPECIMEN:  PATHOLOGY  COUNTS:  YES  TOURNIQUET:  * No tourniquets in log *  DICTATION: .Other Dictation: Dictation Number [id number]  PLAN OF CARE: Discharge to home after PACU  PATIENT DISPOSITION:  PACU - hemodynamically stable.   Delay start of Pharmacological VTE agent (>24hrs) due to surgical blood loss or risk of bleeding: not applicable

## 2013-02-16 NOTE — H&P (Signed)
[name] is an 62 y.o. female.   Chief Complaint: Chronic sinusitis HPI: Pt with prev ESS and septoplasty, chronic sinusitis  Past Medical History  Diagnosis Date  . Breast cancer 03/24/2011  . Asthma   . Allergy history unknown   . Sinusitis   . PONV (postoperative nausea and vomiting)   . Shortness of breath     asthma  . Pneumonia     hx of  . Bronchitis   . GERD (gastroesophageal reflux disease)   . Headache(784.0)   . Dizziness   . Glaucoma     Past Surgical History  Procedure Laterality Date  . Breast lumpectomy  08-2009    left   . Nose surgery  12-2006  . Oophorectomy  2001  . Eye surgery Bilateral     laser surgery - glaucoma  . Eyelid surgery Bilateral     for glaucoma    Family History  Problem Relation Age of Onset  . Breast cancer Mother   . Asthma Brother   . Emphysema Father   . Heart disease Father    Social History:  reports that she has never smoked. She has never used smokeless tobacco. She reports that  drinks alcohol. She reports that she does not use illicit drugs.  Allergies:  Allergies  Allergen Reactions  . Amoxicillin-Pot Clavulanate Nausea And Vomiting  . Doxycycline Nausea And Vomiting    Medications Prior to Admission  Medication Sig Dispense Refill  . acetaminophen (TYLENOL) 500 MG tablet Take 1,000 mg by mouth every 6 (six) hours as needed for pain.      Marland Kitchen ALPRAZolam (XANAX) 0.25 MG tablet Take 0.25 mg by mouth at bedtime as needed.        Marland Kitchen aspirin 81 MG tablet Take 81 mg by mouth daily.        . benzonatate (TESSALON) 200 MG capsule Take 1 capsule (200 mg total) by mouth every 8 (eight) hours as needed for cough.  30 capsule  PRN  . brinzolamide (AZOPT) 1 % ophthalmic suspension Place 1 drop into both eyes 2 (two) times daily.        . cetirizine (ZYRTEC) 10 MG tablet Take 1 tablet (10 mg total) by mouth daily.  90 tablet  3  . cholecalciferol (VITAMIN D) 400 UNITS TABS Take 400 Units by mouth daily.       . clarithromycin  (BIAXIN) 500 MG tablet 1 twice daily after meals  28 tablet  1  . dexlansoprazole (DEXILANT) 60 MG capsule Take 1 capsule (60 mg total) by mouth daily.  90 capsule  3  . escitalopram (LEXAPRO) 20 MG tablet Take 20 mg by mouth daily.      Marland Kitchen estradiol (ESTRACE) 0.1 MG/GM vaginal cream Place 0.25 Applicatorfuls vaginally daily.  42.5 g  0  . Fluticasone Furoate-Vilanterol (BREO ELLIPTA) 100-25 MCG/INH AEPB Inhale 1 puff into the lungs daily.  28 each  prn  . gabapentin (NEURONTIN) 300 MG capsule Take 1 capsule (300 mg total) by mouth at bedtime.  90 capsule  12  . latanoprost (XALATAN) 0.005 % ophthalmic solution Place 1 drop into both eyes at bedtime.        . mometasone (NASONEX) 50 MCG/ACT nasal spray Place 2 sprays into the nose daily.  51 g  3  . montelukast (SINGULAIR) 10 MG tablet Take 1 tablet (10 mg total) by mouth at bedtime.  90 tablet  3  . tamoxifen (NOLVADEX) 20 MG tablet Take 1 tablet (20  mg total) by mouth daily.  90 tablet  3  . EPINEPHrine (EPIPEN 2-PAK) 0.3 mg/0.3 mL DEVI Inject 0.3 mLs (0.3 mg total) into the muscle once.  1 Device  3    Results for orders placed during the hospital encounter of 02/15/13 (from the past 48 hour(s))  CBC     Status: None   Collection Time    02/15/13  2:41 PM      Result Value Range   WBC 7.8  4.0 - 10.5 K/uL   RBC 4.52  3.87 - 5.11 MIL/uL   Hemoglobin 14.0  12.0 - 15.0 g/dL   HCT 40.9  81.1 - 91.4 %   MCV 89.8  78.0 - 100.0 fL   MCH 31.0  26.0 - 34.0 pg   MCHC 34.5  30.0 - 36.0 g/dL   RDW 78.2  95.6 - 21.3 %   Platelets 261  150 - 400 K/uL  BASIC METABOLIC PANEL     Status: Abnormal   Collection Time    02/15/13  2:41 PM      Result Value Range   Sodium 138  135 - 145 mEq/L   Potassium 3.4 (*) 3.5 - 5.1 mEq/L   Chloride 100  96 - 112 mEq/L   CO2 28  19 - 32 mEq/L   Glucose, Bld 120 (*) 70 - 99 mg/dL   BUN 14  6 - 23 mg/dL   Creatinine, Ser 0.86  0.50 - 1.10 mg/dL   Calcium 8.7  8.4 - 57.8 mg/dL   GFR calc non Af Amer 76 (*) >90  mL/min   GFR calc Af Amer 88 (*) >90 mL/min   Comment: (NOTE)     The eGFR has been calculated using the CKD EPI equation.     This calculation has not been validated in all clinical situations.     eGFR's persistently <90 mL/min signify possible Chronic Kidney     Disease.   No results found.  Review of Systems  Constitutional: Negative.   Respiratory: Negative.   Cardiovascular: Negative.   Gastrointestinal: Negative.   Neurological: Negative.     Blood pressure 169/88, pulse 65, temperature 97.2 F (36.2 C), temperature source Oral, resp. rate 18, SpO2 98.00%. Physical Exam  Constitutional: She is oriented to person, place, and time. She appears well-developed and well-nourished.  HENT:  Nasal polyposis  Neck: Normal range of motion. Neck supple.  Cardiovascular: Normal rate.   Respiratory: Effort normal.  GI: Soft.  Musculoskeletal: Normal range of motion.  Neurological: She is alert and oriented to person, place, and time.     Assessment/Plan Adm for OP Rev ESS, plan d/c post op.  [name] 02/16/2013, 7:36 AM

## 2013-02-17 ENCOUNTER — Encounter (HOSPITAL_COMMUNITY): Payer: Self-pay | Admitting: Otolaryngology

## 2013-02-17 NOTE — Op Note (Signed)
[name], [name]                  ACCOUNT NO.:  [id number]  MEDICAL RECORD NO.:  [id number]  LOCATION:  [facility]                         FACILITY:  [facility]  PHYSICIAN:  Kinnie Scales. Annalee Genta, M.D.DATE OF BIRTH:  1950-06-15  DATE OF PROCEDURE:  02/16/2013 DATE OF DISCHARGE:  02/16/2013                              OPERATIVE REPORT   PREOPERATIVE DIAGNOSES: 1. Chronic sinusitis. 2. Reactive bronchitis. 3. Status post previous endoscopic sinus surgery.  POSTOPERATIVE DIAGNOSES: 1. Chronic sinusitis. 2. Reactive bronchitis. 3. Status post previous endoscopic sinus surgery.  INDICATIONS FOR SURGERY: 1. Chronic sinusitis. 2. Reactive bronchitis. 3. Status post previous endoscopic sinus surgery.  SURGICAL PROCEDURE:  Bilateral revision endoscopic sinus surgery with intraoperative computer-assisted navigation consisting of maxillary antrostomy with removal of diseased tissue, and nasofrontal recess exploration.  ANESTHESIA:  General endotracheal.  COMPLICATIONS:  None.  ESTIMATED BLOOD LOSS:  Less than 100 mL.  SURGEON:  Kinnie Scales. Annalee Genta, MD.  DISPOSITION:  The patient transferred from the operating room to the recovery room in stable condition.  FINDINGS:  Bilateral mucopurulent material within the maxillary sinuses, which was cultured from the left side and sent for aerobic and anaerobic culture and sensitivity.  Nasofrontal recess exploration was performed. The patient was found to have polypoid material with complete occlusion of the nasofrontal recess bilaterally, sinus recess ostia/ostia were enlarged and a large polypoid mass was removed from the left frontal sinus.  A 50:50 mix of Bactroban and Kenalog 40 was instilled within the frontal ethmoid sinuses bilaterally.  No packing was placed.  BRIEF HISTORY:  The patient is a 63 year old, white female who has been followed with a history of chronic sinusitis and reactive airway disease.  She underwent previous similar  history here, previous endoscopic sinus surgery in 2007, with significant improvement in symptoms of nasal airway obstruction and congestion.  Surgery included bilateral endoscopic sinus surgery and nasal septoplasty.  Over the last 6 months, the patient has had increasing symptoms of bilateral frontal headaches, mucopurulent discharge and cough.  She has a history of reactive bronchitis and has airway difficulty when her infection worsens.  She was treated with multiple courses of antibiotics oral and topical nasal steroids, and despite appropriate and aggressive medical therapy continued to have chronic symptoms.  A CT scan was obtained after medical therapy which showed soft tissue obstruction of the nasofrontal recess and frontal sinuses bilaterally, and a mucopurulent material with air-fluid levels in the maxillary sinuses.  Given her chronic history findings and CT scan, I recommended bilateral revision endoscopic sinus surgery as outlined above.  The risks and benefits of these procedures were discussed in detail with the patient and her husband, and they understood and concurred with our plan for surgery which is scheduled as an outpatient under general anesthesia at [facility] on February 16, 2013.  PROCEDURE IN DETAIL:  The patient was brought to the operating room, placed in supine position on the operating table.  General endotracheal anesthesia was established without difficulty.  When the patient was adequately anesthetized, she was positioned on the operating table and prepped and draped in a sterile fashion.  Her  nose was injected with a total of  5 mL of 1% lidocaine with 1:100,000 dilution epinephrine injected in the submucosal fashion along the lateral nasal wall and middle turbinate bilaterally.  The patient's nose was then packed with Afrin-soaked cottonoid pledgets and left in place for approximately 10 minutes for vasoconstriction and hemostasis.  The Xomed  fusion navigation headgear was applied and anatomic and surgical landmarks were identified and confirmed.  The fusion device was used throughout the endoscopic sinus portion of the procedure.  When the patient prepared for surgery, she was prepped and draped and positioned.  Surgical procedure was begun on the patient's right-hand side using a 0- degree endoscope, the nasal passageway was examined.  The previous sinus surgery had created a widely patent ethmoid cavity with minimal scarring and some mucoid discharge.  Examination of the right maxillary sinus using a 45-degree telescope, showed some obstruction of the natural ostium.  A posterior surgical ostium was patent and there was thick mucopurulent material within the right maxillary sinus which was irrigated and cleared completely.  Attention was then turned to the roof of the ethmoid sinus and inspection was carried out from posterior to anterior.  The nasofrontal recess was completely occluded with polypoid material.  Using the navigation tool and gentle palpation, I was able to identify the natural ostium of the frontal sinus, along the nasofrontal recess using a curved 40 and 60 degree microdebrider.  Soft tissue was resected and the frontal sinus ostium was enlarged creating a widely patent nasofrontal recess.  There was heavy polypoid material within the sinus which was carefully debrided.  The left-hand side was then inspected with a 0-degree endoscope and the nasal passageway was widely patent.  Again, there was thick mucopurulent material emanating from the right maxillary ostium.  The surgical ostium was quite large, but there was polypoid material on the anterior aspect along the natural outflow tract.  Using a curved microdebrider, this was debrided to open more widely and create better outflow.  The sinus was then inspected.  Within the sinus, there was thick mucopurulent mucinous appearing debris which was sent for  culture and sensitivity.  The sinus was completely cleared of active disease and some polypoid material. The ethmoid sinus was then inspected using a 45-degree telescope.  The ethmoid sinus was widely patent without polypoid debris or evidence of infection, dissecting or inspecting from posterior to anterior.  The nasofrontal recess was identified.  Again, this was completely occluded with polypoid material.  Using a curved microdebrider, polyps were removed from the sinus ostium and dissection was carried into the frontal sinus.  The patient had a tight 3 midline accessory frontal sinus and the opening was blocked by large polyp.  The intervening bony tissue was removed with through cutting forceps and polypoid material was resected from within the frontal sinus.  Again, a thick mucinous debris was cleared from the frontal sinus, and at the conclusion of procedure, the common drainage pathway was widely opened.  The superior attachment of the middle turbinate was carefully removed and air cells in that area were resected with the microdebrider to create maximally patent ostium.  The patient's sinuses were then instilled with a 50:50 slurry of Kenalog 40 and Bactroban cream which was supplied in the frontal and maxillary sinuses bilaterally.  Her oral cavity and oropharynx were irrigated and suctioned. Orogastric tube was passed.  Stomach contents were aspirated.  The surgical sponge count was correct.  The patient was awakened from anesthetic, extubated, and then transferred from the operating room to  the recovery room in stable condition.  No complications and blood loss was approximately 100 mL.          ______________________________ Kinnie Scales. Annalee Genta, M.D.     DLS/MEDQ  D:  [id number]  T:  02/17/2013  Job:  [id number]

## 2013-02-19 LAB — CULTURE, ROUTINE-SINUS

## 2013-02-21 LAB — ANAEROBIC CULTURE

## 2013-03-30 ENCOUNTER — Other Ambulatory Visit (HOSPITAL_BASED_OUTPATIENT_CLINIC_OR_DEPARTMENT_OTHER)

## 2013-03-30 DIAGNOSIS — C50919 Malignant neoplasm of unspecified site of unspecified female breast: Secondary | ICD-10-CM

## 2013-03-30 DIAGNOSIS — C50912 Malignant neoplasm of unspecified site of left female breast: Secondary | ICD-10-CM

## 2013-03-30 LAB — COMPREHENSIVE METABOLIC PANEL (CC13)
ALT: 14 U/L (ref 0–55)
AST: 21 U/L (ref 5–34)
Albumin: 3.7 g/dL (ref 3.5–5.0)
Alkaline Phosphatase: 111 U/L (ref 40–150)
Anion Gap: 11 meq/L (ref 3–11)
BUN: 14.5 mg/dL (ref 7.0–26.0)
CO2: 26 meq/L (ref 22–29)
Calcium: 9.1 mg/dL (ref 8.4–10.4)
Chloride: 106 meq/L (ref 98–109)
Creatinine: 0.9 mg/dL (ref 0.6–1.1)
Glucose: 115 mg/dL (ref 70–140)
Potassium: 3.8 meq/L (ref 3.5–5.1)
Sodium: 143 meq/L (ref 136–145)
Total Bilirubin: 0.23 mg/dL (ref 0.20–1.20)
Total Protein: 6.8 g/dL (ref 6.4–8.3)

## 2013-03-30 LAB — CBC WITH DIFFERENTIAL/PLATELET
BASO%: 0.8 % (ref 0.0–2.0)
Basophils Absolute: 0.1 10*3/uL (ref 0.0–0.1)
EOS%: 15.1 % — ABNORMAL HIGH (ref 0.0–7.0)
HCT: 38.4 % (ref 34.8–46.6)
HGB: 12.9 g/dL (ref 11.6–15.9)
MCH: 30.1 pg (ref 25.1–34.0)
MCHC: 33.6 g/dL (ref 31.5–36.0)
MCV: 89.5 fL (ref 79.5–101.0)
MONO#: 0.4 10*3/uL (ref 0.1–0.9)
MONO%: 7.4 % (ref 0.0–14.0)
NEUT%: 52.6 % (ref 38.4–76.8)
RBC: 4.29 10*6/uL (ref 3.70–5.45)
lymph#: 1.4 10*3/uL (ref 0.9–3.3)

## 2013-04-03 ENCOUNTER — Telehealth: Payer: Self-pay | Admitting: Internal Medicine

## 2013-04-03 MED ORDER — PREDNISONE 10 MG PO TABS: ORAL_TABLET | ORAL | Status: DC

## 2013-04-03 NOTE — Telephone Encounter (Signed)
Per CY-give Prednisone 10 mg #20 take 4 x 2 days, 3 x 2 days, 2 x 2 days, 1 x 2 days, then stop no refills. Thanks.

## 2013-04-03 NOTE — Telephone Encounter (Signed)
Pt is aware of CY recs. Rx has been sent in. 

## 2013-04-03 NOTE — Telephone Encounter (Signed)
I spoke with [name]. She c/o dry cough, stuffy nose, chest congestion, wheezing, x yesterday. She had the polyps removed from sinus's on 02/17/13. She has been taking zyrtec, singulair, nasonex, salt water rinses, and restarted her symbicort inhaler. Please advise Dr. Maple Hudson thanks  Allergies  Allergen Reactions  . Amoxicillin-Pot Clavulanate Nausea And Vomiting  . Doxycycline Nausea And Vomiting

## 2013-04-06 ENCOUNTER — Ambulatory Visit (HOSPITAL_BASED_OUTPATIENT_CLINIC_OR_DEPARTMENT_OTHER): Admitting: Oncology

## 2013-04-06 VITALS — BP 163/89 | HR 80 | Temp 98.1°F | Resp 18 | Ht 62.0 in | Wt 160.6 lb

## 2013-04-06 DIAGNOSIS — C50919 Malignant neoplasm of unspecified site of unspecified female breast: Secondary | ICD-10-CM

## 2013-04-06 NOTE — Progress Notes (Signed)
ID: [name]   DOB: Feb 16, 1951  MR#: [id number]  CSN#:[id number]   PCP: GYN: SU: Avel Peace OTHER MD: Maryanna Shape  HISTORY OF PRESENT ILLNESS: [name] had routine screening mammography March 29th, 2011 at [facility].  This showed a possible mass in the left breast.  [name] was recalled for additional studies on April 5th.  This study demonstrated a focal mass in the outer mid-portion of the left breast with minimal irregularity.  Ultrasound showed an ovoid hypoechoic mass measuring up to 8 mm.  Again, there was minimal irregularity noted but biopsy was suggested and performed the same day.  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  The proliferation marker was 19% and there was no evidence of [name]-2 amplification by CISH with a ratio of 1.03.  With this information, the patient was referred to Dr. Abbey Chatters and bilateral breast MRIs were obtained on August 27, 2009.  This showed an 11 millimeter mass in the central portion of the left breast correlated with the known malignancy.  There were no other areas suspicious for cancer and on April 26th, 2011, the patient underwent left axillary sentinel lymph node mapping and sampling and left lumpectomy with the final pathology from that procedure ([id number]) showing a 1.3 cm Grade 1 invasive ductal carcinoma, with negative margins, and 0 of 4 sentinel lymph nodes involved.  [name] subsequent history is as detailed below  INTERVAL HISTORY: Harmonii returns today for follow-up of [name] left breast cancer. The interval history is significant for [facility] having had and continuing to have significant respiratory problems. Part of it was in the sinuses, and [name] eventually required sinus surgery in October under Osborn Coho. [name] says that certainly improved the sinus issue. However [name] continues to have a cough  productive of phlegm, yellow to green. This is significant enough that [name] can't exercise because with increased activity [name] starts wheezing and coughing. [name] is followed closely by Dr. Maple Hudson and was recently started on steroids after multiple antibiotic courses, inhalers, pulmonary function tests, x-rays, etc..  REVIEW OF SYSTEMS: [name] is tolerating the tamoxifen with some hot flashes as the main side effect. [name] sleeps poorly largely because [name] husband is so uncomfortable at night that he gets up several times. They do sleep in the same room and he does smoke in that room and he smokes in the house. Given his kidney cancer and multiple other problems, there is no likelihood that he will quit smoking. It is also not realistic for him to smoke outside of the house in this cold weather. A detailed review of systems today was otherwise negative except as noted  PAST MEDICAL HISTORY: Past Medical History  Diagnosis Date  . Breast cancer 03/24/2011  . Asthma   . Allergy history unknown   . Sinusitis   . PONV (postoperative nausea and vomiting)   . Shortness of breath     asthma  . Pneumonia     hx of  . Bronchitis   . GERD (gastroesophageal reflux disease)   . Headache(784.0)   . Dizziness   . Glaucoma   Significant for glaucoma, status post laser surgery bilaterally.  History of unilateral salpingo-oophorectomy.  [name] believes there was a left ovary that was removed.  History of trauma to the left knee with kneecap fracture, not requiring surgery, followed by [name].  History of anxiety disorder.  History of bilateral blepharoplasty.  History of septoplasty.  History of two tooth implants in the right maxilla.  Question of possible osteopenia.  History of benign skin biopsies.    PAST SURGICAL HISTORY: Past Surgical History  Procedure Laterality Date  . Breast lumpectomy  08-2009    left   . Nose surgery  12-2006  . Oophorectomy  2001  . Eye surgery Bilateral     laser surgery -  glaucoma  . Eyelid surgery Bilateral     for glaucoma  . Sinus endo w/fusion Bilateral 02/16/2013    Procedure: ENDOSCOPIC SINUS SURGERY WITH FUSION NAVIGATION;  Surgeon: Osborn Coho, MD;  Location: [facility];  Service: ENT;  Laterality: Bilateral;    FAMILY HISTORY Family History  Problem Relation Age of Onset  . Breast cancer Mother   . Asthma Brother   . Emphysema Father   . Heart disease Father   The patient's father died at the age of 57 from emphysema.  He was a Psychologist, occupational.  The patient's mother is alive at age 53.  [name] had a myocardial infarction about 10 years ago. The patient has two brothers.  There is no history of breast or ovarian cancer in the family.  GYNECOLOGIC HISTORY: [name] is GX P1.  First pregnancy to term at age 89.  [name] understands that increases the risk of breast cancer developing.  Last menstrual period was about 8 years ago and [name] took hormones for about five years.    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  [name] husband [name] is disabled secondary to [name], diabetes, and peripheral vascular disease. He also has a history of kidney cancer. Their daughter, [name], graduated from college in nutrition. [name] was  married in 2011. The patient's husband also has 4 children from a prior marriage, and 9 grandchildren. The patient attends the [employer] in [place].     ADVANCED DIRECTIVES: In place  HEALTH MAINTENANCE: History  Substance Use Topics  . Smoking status: Never Smoker   . Smokeless tobacco: Never Used  . Alcohol Use: Yes     Comment: rarely/socially     Colonoscopy:  PAP:  Bone density: July 2011/ mild osteopenia  Lipid panel: UTD, Dr. Hollice Espy ([place])  Allergies  Allergen Reactions  . Amoxicillin-Pot Clavulanate Nausea And Vomiting  . Doxycycline Nausea And Vomiting    Current Outpatient Prescriptions  Medication Sig Dispense Refill  . acetaminophen (TYLENOL) 500 MG tablet Take 1,000 mg by mouth every 6  (six) hours as needed for pain.      Marland Kitchen ALPRAZolam (XANAX) 0.25 MG tablet Take 0.25 mg by mouth at bedtime as needed.        Marland Kitchen aspirin 81 MG tablet Take 81 mg by mouth daily.        . benzonatate (TESSALON) 200 MG capsule Take 1 capsule (200 mg total) by mouth every 8 (eight) hours as needed for cough.  30 capsule  PRN  . brinzolamide (AZOPT) 1 % ophthalmic suspension Place 1 drop into both eyes 2 (two) times daily.        . cetirizine (ZYRTEC) 10 MG tablet Take 1 tablet (10 mg total) by mouth daily.  90 tablet  3  . cholecalciferol (VITAMIN D) 400 UNITS TABS Take 400 Units by mouth daily.       . clarithromycin (BIAXIN) 500 MG tablet 1 twice daily after meals  28 tablet  1  . dexlansoprazole (DEXILANT) 60 MG capsule Take 1 capsule (60  mg total) by mouth daily.  90 capsule  3  . EPINEPHrine (EPIPEN 2-PAK) 0.3 mg/0.3 mL DEVI Inject 0.3 mLs (0.3 mg total) into the muscle once.  1 Device  3  . escitalopram (LEXAPRO) 20 MG tablet Take 20 mg by mouth daily.      Marland Kitchen estradiol (ESTRACE) 0.1 MG/GM vaginal cream Place 0.25 Applicatorfuls vaginally daily.  42.5 g  0  . Fluticasone Furoate-Vilanterol (BREO ELLIPTA) 100-25 MCG/INH AEPB Inhale 1 puff into the lungs daily.  28 each  prn  . gabapentin (NEURONTIN) 300 MG capsule Take 1 capsule (300 mg total) by mouth at bedtime.  90 capsule  12  . HYDROcodone-acetaminophen (NORCO) 5-325 MG per tablet Take 1-2 tablets by mouth every 6 (six) hours as needed for pain.  30 tablet  0  . latanoprost (XALATAN) 0.005 % ophthalmic solution Place 1 drop into both eyes at bedtime.        Marland Kitchen levofloxacin (LEVAQUIN) 500 MG tablet Take 1 tablet (500 mg total) by mouth daily. 1 tab po qd  10 tablet  0  . mometasone (NASONEX) 50 MCG/ACT nasal spray Place 2 sprays into the nose daily.  51 g  3  . montelukast (SINGULAIR) 10 MG tablet Take 1 tablet (10 mg total) by mouth at bedtime.  90 tablet  3  . predniSONE (DELTASONE) 10 MG tablet Take 4 tablets x2 days, 3 tablets x2 days, 2  tablets x2 days, 1 tablet x2 days then stop  20 tablet  0  . tamoxifen (NOLVADEX) 20 MG tablet Take 1 tablet (20 mg total) by mouth daily.  90 tablet  3   No current facility-administered medications for this visit.    OBJECTIVE: middle aged white woman who appears stated age 58 Vitals:   04/06/13 1622  BP: 163/89  Pulse: 80  Temp: 98.1 F (36.7 C)  Resp: 18     Body mass index is 29.37 kg/(m^2).    ECOG FS: 1 Filed Weights   04/06/13 1622  Weight: 160 lb 9.6 oz (72.848 kg)    Sclerae unicteric, pupils equal and reactive Oropharynx clear and moist, no thrush or other lesions noted No cervical or supraclavicular adenopathy Lungs show diffuse upper respiratory sounds/wheezes, no rales or rhonchi with fair excursion bilaterally Heart regular rate and rhythm, no murmur appreciated Abdomen soft, nontender to palpation, positive bowel sounds MSK no focal spinal tenderness to palpation, no upper extremity lymphedema Neuro: nonfocal, well oriented, positive affect Breasts: Right breast is unremarkable. Left breast is status post lumpectomy with no evidence of local recurrence. Axillae are benign bilaterally   LAB RESULTS: Lab Results  Component Value Date   WBC 6.0 03/30/2013   NEUTROABS 3.2 03/30/2013   HGB 12.9 03/30/2013   HCT 38.4 03/30/2013   MCV 89.5 03/30/2013   PLT 230 03/30/2013      Chemistry      Component Value Date/Time   NA 143 03/30/2013 1613   NA 138 02/15/2013 1441   K 3.8 03/30/2013 1613   K 3.4* 02/15/2013 1441   CL 100 02/15/2013 1441   CL 102 09/30/2012 1610   CO2 26 03/30/2013 1613   CO2 28 02/15/2013 1441   BUN 14.5 03/30/2013 1613   BUN 14 02/15/2013 1441   CREATININE 0.9 03/30/2013 1613   CREATININE 0.82 02/15/2013 1441      Component Value Date/Time   CALCIUM 9.1 03/30/2013 1613   CALCIUM 8.7 02/15/2013 1441   ALKPHOS 111 03/30/2013 1613   ALKPHOS 137*  08/31/2011 1201   AST 21 03/30/2013 1613   AST 23 08/31/2011 1201   ALT 14 03/30/2013 1613    ALT 18 08/31/2011 1201   BILITOT 0.23 03/30/2013 1613   BILITOT 0.2* 08/31/2011 1201       STUDIES:  Bone density from July 2011 was normal.  Mm Digital Diagnostic Bilat  09/28/2012   *RADIOLOGY REPORT*  Clinical Data:  History of malignant lumpectomy of the left breast in 2011.  Annual reevaluation.  DIGITAL DIAGNOSTIC BILATERAL MAMMOGRAM WITH CAD  Comparison: 09/17/2011, 08/19/2010, 09/10/2009, 08/13/2009.  Findings:  ACR Breast Density Category 3: The breast tissue is heterogeneously dense.  There are stable scarring changes located within the upper-outer quadrant of the left breast related to the patient's lumpectomy. There are stable benign calcifications in the region of the lumpectomy bed.  There is no specific evidence for recurrent tumor or developing malignancy within either breast.  The right breast is stable, as well.  Mammographic images were processed with CAD.  IMPRESSION: No findings worrisome for recurrent tumor or developing malignancy.  RECOMMENDATION: Bilateral diagnostic mammography in 1 year.  I have discussed the findings and recommendations with the patient. Results were also provided in writing at the conclusion of the visit.  If applicable, a reminder letter will be sent to the patient regarding [name] next appointment.  BI-RADS CATEGORY 2:  Benign finding(s).   Original Report Authenticated By: Rolla Plate, M.D.    ASSESSMENT: 62 y.o.  Kathryne Sharper woman   (1)  status post left lumpectomy and sentinel lymph node dissection in April 2011 for a T1c N0, stage IA invasive ductal carcinoma, grade 1,strongly estrogen and progesterone receptor positive, HER2 negative with a borderline proliferation fraction and normal preoperative CA27.29.    (2)  [name] completed radiation therapy July 2011   (3)  started tamoxifen in July 2011  PLAN:  We went over [facility] respiratory problems for better than 40 minutes today. Dr. Maple Hudson has just started antibiotics, after trying  antibiotics, inhalers, proton pump inhibitors, multiple cultures (cultures from the sinus surgery showed only coagulase-negative staph) and multiple studies including surprisingly good pulmonary function tests 2 years ago and a clear chest x-ray July of this year.  I really think Saaya's symptoms are convincing for [name] chronic bronchitis, and if [name] were a smoker we would expect that to be the cause. It turns out [name] is exposed a considerable secondhand smoke. [name] did ask [name] husband up to smoke and their bedroom, and we complied for 2 days but then he did smoke "just a little" and soon things were back to the way it was before.  My suggestion to them is that date start sleeping a separate rooms and that if John can't quit smoking, as most people can't, he at least limit the smoking to a certain area in a house; possibly they could arrange for some ventilation without his having to go outside to smoke, which I think would be difficult for him this time of year. If they could do this experiment for 30 days and [name] bronchitis got considerably better, that really would be a good diagnostic trial  As far as breast cancer is concerned that Kelleigh is doing great. Our original plan was for tamoxifen to continue for 10 years. [name] is beginning to think perhaps [name] would like to stop at 5 years the differences is 3% risk of recurrence (in other words an additional 5 years of tamoxifen lowers the risk of recurrence by approximately 3% above  and beyond the effect of the first 5 years). We will discuss that at the next visit here, which will be in may, after [name] next mammogram.  [name] knows to call for any problems that may develop before the next visit here.   MAGRINAT,GUSTAV C    04/06/2013

## 2013-04-18 ENCOUNTER — Telehealth: Payer: Self-pay | Admitting: Internal Medicine

## 2013-04-18 MED ORDER — FLUCONAZOLE 150 MG PO TABS: 150.0000 mg | ORAL_TABLET | Freq: Every day | ORAL | Status: DC

## 2013-04-18 NOTE — Telephone Encounter (Signed)
Per CY-give Diflucan 150 mg #3 take 1 po qd no refills.

## 2013-04-18 NOTE — Telephone Encounter (Signed)
Pt is aware of CY recs. Rx has been sent in. 

## 2013-04-18 NOTE — Telephone Encounter (Signed)
Pt reports white patches on tongue, roof of mouth and inside cheeks.  Requesting something for thrush.  Please advise Allergies  Allergen Reactions  . Amoxicillin-Pot Clavulanate Nausea And Vomiting  . Doxycycline Nausea And Vomiting

## 2013-04-21 ENCOUNTER — Telehealth: Payer: Self-pay | Admitting: Internal Medicine

## 2013-04-21 DIAGNOSIS — R059 Cough, unspecified: Secondary | ICD-10-CM

## 2013-04-21 DIAGNOSIS — R05 Cough: Secondary | ICD-10-CM

## 2013-04-21 MED ORDER — CIPROFLOXACIN HCL 500 MG PO TABS: 500.0000 mg | ORAL_TABLET | Freq: Two times a day (BID) | ORAL | Status: DC

## 2013-04-21 NOTE — Telephone Encounter (Signed)
Pt aware if recs,. Orders placed. Nothing further needed

## 2013-04-21 NOTE — Telephone Encounter (Signed)
I called and spoke with pt. She c/o chest congestion, wheezing, some productive cough w/ yellow phlem. She already has had polyps removed from her nose. Pt is requesting recs. Please advise Dr. Maple Hudson thanks  Allergies  Allergen Reactions  . Amoxicillin-Pot Clavulanate Nausea And Vomiting  . Doxycycline Nausea And Vomiting

## 2013-04-21 NOTE — Telephone Encounter (Signed)
Please order sputum Cx- routine C&S, fungal and AFB  Once she has turned this in, suggest Cipro 500 mg, # 14, 1 twice daily,   Mucinex DM may help some.

## 2013-04-24 ENCOUNTER — Other Ambulatory Visit

## 2013-04-24 ENCOUNTER — Other Ambulatory Visit: Payer: Self-pay | Admitting: Internal Medicine

## 2013-04-24 DIAGNOSIS — R05 Cough: Secondary | ICD-10-CM

## 2013-04-24 DIAGNOSIS — R059 Cough, unspecified: Secondary | ICD-10-CM

## 2013-04-25 ENCOUNTER — Encounter: Payer: Self-pay | Admitting: Internal Medicine

## 2013-04-25 ENCOUNTER — Telehealth: Payer: Self-pay | Admitting: Internal Medicine

## 2013-04-25 ENCOUNTER — Encounter: Payer: Self-pay | Admitting: *Deleted

## 2013-04-25 ENCOUNTER — Ambulatory Visit (INDEPENDENT_AMBULATORY_CARE_PROVIDER_SITE_OTHER): Admitting: Internal Medicine

## 2013-04-25 VITALS — BP 140/82 | HR 93 | Ht 62.0 in | Wt 161.6 lb

## 2013-04-25 DIAGNOSIS — J329 Chronic sinusitis, unspecified: Secondary | ICD-10-CM

## 2013-04-25 DIAGNOSIS — J45901 Unspecified asthma with (acute) exacerbation: Secondary | ICD-10-CM

## 2013-04-25 DIAGNOSIS — J3089 Other allergic rhinitis: Secondary | ICD-10-CM

## 2013-04-25 MED ORDER — PHENYLEPHRINE HCL 1 % NA SOLN
3.0000 [drp] | Freq: Once | NASAL | Status: AC
  Administered 2013-04-25: 3 [drp] via NASAL

## 2013-04-25 MED ORDER — LEVALBUTEROL HCL 0.63 MG/3ML IN NEBU
0.6300 mg | INHALATION_SOLUTION | Freq: Once | RESPIRATORY_TRACT | Status: AC
  Administered 2013-04-25: 0.63 mg via RESPIRATORY_TRACT

## 2013-04-25 MED ORDER — CLINDAMYCIN HCL 300 MG PO CAPS: ORAL_CAPSULE | ORAL | Status: DC

## 2013-04-25 MED ORDER — METHYLPREDNISOLONE ACETATE 80 MG/ML IJ SUSP
80.0000 mg | Freq: Once | INTRAMUSCULAR | Status: AC
  Administered 2013-04-25: 80 mg via INTRAMUSCULAR

## 2013-04-25 NOTE — Telephone Encounter (Signed)
Per CY-have patient come in today at 4:00pm slot to see CY and get neb treatment. Thanks.

## 2013-04-25 NOTE — Assessment & Plan Note (Signed)
Aggressive exacerbation with wheeze and cough As soon as she came off of prednisone Plan-change Cipro to Cleocin. Depo-Medrol. Treat sinus disease

## 2013-04-25 NOTE — Telephone Encounter (Signed)
Called, spoke with pt -  On 12/5, pt was advised to leave sputum sample and start cipro after leaving sample.  Pt states she started the cipro on Friday, and got sputum sample yesterday.  Pt states she hasn't received much relief from cipro.  Is coughing a lot.  Cough is deep and is prod with small amount of yellow tinted mucus.  Pt also has sinus pressure/pain, increased SOB, wheezing, chest tightness, and felt feverish yesterday.  Is using sinus irrigations and advil.  She is requesting to come in today for breathing tx.  Dr. Maple Hudson, pls advise as you have no openings.  Thank you.  Allergies  Allergen Reactions  . Amoxicillin-Pot Clavulanate Nausea And Vomiting  . Doxycycline Nausea And Vomiting

## 2013-04-25 NOTE — Telephone Encounter (Signed)
I called and spoke with pt. appt scheduled. Nothing further needed 

## 2013-04-25 NOTE — Assessment & Plan Note (Signed)
Again describing sinus pressure pain after several days on Cipro Plan-change Cipro to Cleocin, add a probiotic, nasal nebulizer decongestant

## 2013-04-25 NOTE — Telephone Encounter (Signed)
lmomtcb  

## 2013-04-25 NOTE — Patient Instructions (Addendum)
Use the Symbicort 2 puffs, then rinse mouth, twice every day  Neb neo nasal /Xopenex treatment   Depo 80  Suggest you start taking a probiotic like Florastor otc or Activia yogurt (live culture) to help your stomach with all these antibiotics  Stop Cipro for now. Script for Cleocin 300 mg # 14, 1 twice daily

## 2013-04-25 NOTE — Progress Notes (Signed)
04/28/11- [name] never smoker with a long history of allergic rhinitis, sinusitis and urticaria. Primary care is through [facility].Dr Jana Hakim is her oncologist treating breast cancer with history of left lumpectomy, radiation and tamoxifen. She has had a past history of pneumonia, a diagnosis of asthma and bronchitis noted after treatment for the cancer. She has not had either flu vaccine or pneumonia vaccine. She had been treated by Dr Caprice Red over many years for her allergy complaints and had been on allergy vaccine several times. She was to begin again in November of 2010 but apparently did not do so. With his retirement, she is seeking to change practice. She is complaining of fullness and popping in her ears, heavy sensation in the chest but not much itch, sneeze or drainage. Nasal symptoms are usually perennial, worse in the fall and winter with mold exposure. She has had education on avoidance and control of dust and environmental allergens.  She was  skin tested at age 53 because of sinusitis then. She has given her allergy vaccine or had her husband give it, over many years. They also give her husband's insulin injections. Nasal surgery with septoplasty by Dr. Wilburn Cornelia in 2008. Chest x-ray 03/24/2011 no active disease.  08/13/11- [name] never smoker with a long history of allergic rhinitis, asthma/ bronchitis, sinusitis and urticaria, complicated by past hx breast Ca. Cough producing thick yellow sputum. Symbicort helped but she ran out of the sample. Continues allergy vaccine based on skin testing from another office, with our vaccine made here. She says she really feels it helps, especially her head congestion. CXR 03/24/2011-no active disease, normal. PFT 04/29/2011- WNL, FEV1/FVC 0.74.  06/07/12- [name] never smoker with a long history of allergic rhinitis, asthma/ bronchitis, sinusitis and urticaria, complicated by past hx breast Ca. Follows For: Sinus drainage  (green, thick) - Congestion at night - Using saline rinses - Sinus pressure - Ear pressure - Mild chest tightness CTAcute visit-for the past week has had head congestion and won't drain, some chills last night. Frontal headache. Ears full. Postnasal drip without sore throat. Chest tight. She had dropped off allergy shots. Taking clindamycin 150 mg 4 times daily and using squeeze bottle saline nasal rinse.  07/21/12- [name] never smoker with a long history of allergic rhinitis, asthma/ bronchitis, sinusitis and urticaria, complicated by past hx breast Ca ACUTE VISIT: ? sinus infection, unable to sleep due to congestion in head,. Has never really felt her head cleared since last visit despite 10 days Cleocin/ Sudafed then. . Frontal and maxillary pressure, thick mucus. Got worse again last week. Using saline rinse. Dr Wilburn Cornelia did sinus surgery 2008. CT chest 09/01/11 IMPRESSION:  No CT evidence for thoracic metastasis. No acute abnormality.  Original Report Authenticated By: Arline Asp, M.D.  08/30/12- [name] never smoker with a long history of allergic rhinitis, asthma/ bronchitis, sinusitis and urticaria, complicated by past hx breast Ca FOLLOWS FOR: states she got better since last visit but now the symptoms of nasal drainage-dark green in color have returned. Spectracef worked best- "huge difference" compared with other antibiotics ,but didn't last long enough. CT max/fac 07/25/12 IMPRESSION:  Postsurgical changes as described. Acute and chronic bilateral  maxillary sinusitis. Right frontal sinus fluid could be acute or  chronic.  Original Report Authenticated By: Rolla Flatten, M.D.  10/20/12- [name] never smoker with a long history of allergic rhinitis, asthma/ bronchitis, sinusitis and urticaria, complicated by past hx breast Ca ACUTE VISIT: increased  nasal drainage. Using Nasal rinses, Zyrtec, and Singulair. Here for Depo and Nasal Tx today. NEEDS REFILL FOR DEXILANT  and EPI PEN (90day  supply) Complains of postnasal drip pressure in her ears without popping, frontal headache. Past history of care by Dr. Stasia Cavalier  12/05/12- [name] never smoker with a long history of allergic rhinitis,chronic sinusitis,  asthma/ bronchitis,  and urticaria, complicated by past hx breast Ca FOLLOWS FOR: pt reports increased congestion x4 days-- c/o f/c/s, feels congestion has moved from head into chest, prod cough w yellow thick mucus, chest tightness, SOB and wheezing-- using ibuprofen for sx relief.  12/08/12- [name] never smoker with a long history of allergic rhinitis,chronic sinusitis,  asthma/ bronchitis,  and urticaria, complicated by past hx breast Ca ACUTE VISIT: seen 12-05-12 continues to have SOB, congestion, and hard time breathing. Wonders if she needs another breathing tx. Continues cleocin started for sinusitis. Got neb and depo here 3 days ago. Continues thick yellow cough. No fever but achey and tired. Asks work LOA for tomorrow.  12/29/12- [name] never smoker with a long history of allergic rhinitis,chronic sinusitis,  asthma/ bronchitis,  and urticaria, complicated by past hx breast Ca/ L lumpectomy ACUTE VISIT: continues to cough and wheeze; non productive mostly. Had 2 rounds of Cleocin. Then Biaxin did seem to help. Cough is now nonproductive, with wheeze. Ran out of Symbicort. CXR 12/08/12 IMPRESSION:  No evidence of acute cardiopulmonary disease.  Original Report Authenticated By: Charline Bills, M.D. CT max fac 12/28/12 IMPRESSION:  Postsurgical changes as described.  Improved bilateral maxillary sinus disease.  Worsening left frontal sinus disease.  Original Report Authenticated By: Davonna Belling, M.D.  04/25/13-  72 yoF never smoker with a long history of allergic rhinitis,chronic sinusitis,  asthma/ bronchitis,  and urticaria, complicated by past hx breast Ca/ L lumpectomy ACUTE VISIT: currently on abx from Friday 04-21-13; feels like she needs neb tx; having wheezing  and cough(today has been worse day). She had sinus surgery October 5 of Dr. Hilliard Clark. Had taken Biaxin then Avelox. We sent prescription for Cipro to be started after sputum culture was submitted. Unfortunately the laboratory would not accept her sputum until 3 days later when it was obtained in the morning. She had taken 3 days of Cipro by that time. Has just finished prednisone taper. Today feels worse after 5 days of Cipro. Describes hard wheezy cough, dry. Bilateral maxillary sinus pressure pain. She prefers Symbicort 160 over [place].  ROS-see HPI Constitutional:   No-   weight loss, night sweats, fevers, chills, fatigue, lassitude. HEENT:   + headaches, difficulty swallowing, tooth/dental problems, sore throat,       No-  sneezing, itching, ear ache, +nasal congestion, +post nasal drip,  CV:  No-   chest pain, orthopnea, PND, swelling in lower extremities, anasarca, dizziness, palpitations Resp: No-   shortness of breath with exertion or at rest.              No- productive cough, + non-productive cough,  No- coughing up of blood.             No- change in color of mucus.  + wheezing, chest tightness..   Skin: No-   rash or lesions. GI:  No-   heartburn, indigestion, abdominal pain, nausea, vomiting,  GU:  MS:  No-   joint pain or swelling.  . Neuro-     nothing unusual Psych:  No- change in mood or affect. No depression or anxiety.  No  memory loss.  OBJ General- Alert, Oriented, Affect-appropriate, Distress- none acute. Skin feels warm. Skin- rash-none, lesions- none, excoriation- none Lymphadenopathy- none Head- atraumatic            Eyes- Gross vision intact, PERRLA, conjunctivae clear secretions            Ears- Hearing, canals-unremarkable            Nose- +turbinate edema, no-Septal dev, polyps, erosion, perforation             Throat- Mallampati II , mucosa clear, drainage- none, tonsils- atrophic Neck- flexible , trachea midline, no stridor , thyroid nl, carotid no  bruit Chest - symmetrical excursion , unlabored           Heart/CV- RRR , no murmur , no gallop  , no rub, nl s1 s2                           - JVD- none , edema- none, stasis changes- none, varices- none           Lung- + bilateral wheeze, cough+Heart ,   No-  dullness, rub- none           Chest wall-  Abd-  Br/ Gen/ Rectal- Not done, not indicated Extrem- cyanosis- none, clubbing, none, atrophy- none, strength- nl Neuro- grossly intact to observation

## 2013-04-25 NOTE — Telephone Encounter (Signed)
Returning triage's call.  [name]

## 2013-04-27 LAB — LOWER RESPIRATORY CULTURE

## 2013-05-01 ENCOUNTER — Other Ambulatory Visit

## 2013-05-01 ENCOUNTER — Telehealth: Payer: Self-pay | Admitting: Internal Medicine

## 2013-05-01 ENCOUNTER — Other Ambulatory Visit: Payer: Self-pay | Admitting: Internal Medicine

## 2013-05-01 DIAGNOSIS — J3089 Other allergic rhinitis: Secondary | ICD-10-CM

## 2013-05-01 DIAGNOSIS — J320 Chronic maxillary sinusitis: Secondary | ICD-10-CM

## 2013-05-01 MED ORDER — PREDNISONE 10 MG PO TABS: ORAL_TABLET | ORAL | Status: DC

## 2013-05-01 NOTE — Telephone Encounter (Signed)
Offer - prednisone 10 mg, # 50      6 x 2 days, 5 x 2 days, 4 x 2 days, 3 x 2 days, 2 x 2 days, 1 x 2 days  Order lab- Allergy profile, mold allergy panel (1610)   For dx chronic sinusitis, asthma

## 2013-05-01 NOTE — Telephone Encounter (Signed)
Spoke with the pt and notified of recs per CDY Sh verbalized understanding  Rx for pred was sent to pharm and lab orders placed

## 2013-05-01 NOTE — Telephone Encounter (Signed)
LAst OV 04-25-13: Pt was given the following at last OV: Use the Symbicort 2 puffs, then rinse mouth, twice every day  Neb neo nasal /Xopenex treatment  Depo 80  Suggest you start taking a probiotic like Florastor otc or Activia yogurt (live culture) to help your stomach with all these antibiotics  Stop Cipro for now. Script for Cleocin 300 mg # 14, 1 twice daily  Pt states today is her last dose of abx. She states she feels improvement in her sinus congestion but that is all. She states she is still very SOB, still having productive cough, hoarseness, and wheezing that she does not feel is any better. Please advise. [name], CMA Allergies  Allergen Reactions  . Amoxicillin-Pot Clavulanate Nausea And Vomiting  . Doxycycline Nausea And Vomiting

## 2013-05-02 ENCOUNTER — Telehealth: Payer: Self-pay | Admitting: *Deleted

## 2013-05-02 DIAGNOSIS — R05 Cough: Secondary | ICD-10-CM

## 2013-05-02 DIAGNOSIS — J329 Chronic sinusitis, unspecified: Secondary | ICD-10-CM

## 2013-05-02 DIAGNOSIS — R059 Cough, unspecified: Secondary | ICD-10-CM

## 2013-05-02 LAB — ALLERGY FULL PROFILE
Aspergillus fumigatus, m3: 5.33 kU/L — ABNORMAL HIGH
Bahia Grass: <0.1 kU/L
Box Elder IgE: 0.23 kU/L — ABNORMAL HIGH
Curvularia lunata: <0.1 kU/L
D. farinae: <0.1 kU/L
Dog Dander: <0.1 kU/L
Fescue: <0.1 kU/L
G005 Rye, Perennial: <0.1 kU/L
G009 Red Top: <0.1 kU/L
Goldenrod: <0.1 kU/L
Helminthosporium halodes: <0.1 kU/L
House Dust Hollister: <0.1 kU/L
IgE (Immunoglobulin E), Serum: 104.5 IU/mL (ref 0.0–180.0)
Lamb's Quarters: <0.1 kU/L
Plantain: <0.1 kU/L
Sycamore Tree: <0.1 kU/L

## 2013-05-02 LAB — MOLD ALLERGENS 1
Aspergillus fumigatus, m3: 5.33 kU/L — ABNORMAL HIGH
Cladosporium Herbarum: <0.1 kU/L
Stemphylium Botryosum: 0.21 kU/L — ABNORMAL HIGH

## 2013-05-02 NOTE — Telephone Encounter (Signed)
Lab results: Notes Recorded by Caryl Ada, CMA on 05/02/2013 at 4:32 PM lmtcb x1 Notes Recorded by Waymon Budge, MD on 05/02/2013 at 4:22 PM Significantly elevated fungus antibodies. I would like to order outpatient lab for aspergillus antibody screen [phone], then get her back sooner than scheduled January visit if there is an opening.  Pt advised, order placed, and appt set. Carron Curie, CMA

## 2013-05-03 ENCOUNTER — Other Ambulatory Visit

## 2013-05-03 DIAGNOSIS — R059 Cough, unspecified: Secondary | ICD-10-CM

## 2013-05-03 DIAGNOSIS — R05 Cough: Secondary | ICD-10-CM

## 2013-05-03 DIAGNOSIS — J329 Chronic sinusitis, unspecified: Secondary | ICD-10-CM

## 2013-05-08 ENCOUNTER — Telehealth: Payer: Self-pay | Admitting: Internal Medicine

## 2013-05-08 ENCOUNTER — Encounter: Payer: Self-pay | Admitting: Internal Medicine

## 2013-05-08 ENCOUNTER — Ambulatory Visit (INDEPENDENT_AMBULATORY_CARE_PROVIDER_SITE_OTHER): Admitting: Internal Medicine

## 2013-05-08 VITALS — BP 120/80 | HR 79 | Ht 62.0 in | Wt 159.4 lb

## 2013-05-08 DIAGNOSIS — J45901 Unspecified asthma with (acute) exacerbation: Secondary | ICD-10-CM

## 2013-05-08 DIAGNOSIS — J329 Chronic sinusitis, unspecified: Secondary | ICD-10-CM

## 2013-05-08 LAB — ALLERGEN A FUMIGATUS IGG

## 2013-05-08 MED ORDER — FLUCONAZOLE 150 MG PO TABS: 150.0000 mg | ORAL_TABLET | Freq: Every day | ORAL | Status: DC

## 2013-05-08 MED ORDER — PREDNISONE 10 MG PO TABS: ORAL_TABLET | ORAL | Status: DC

## 2013-05-08 NOTE — Progress Notes (Signed)
04/28/11- 60 yoF never smoker with a long history of allergic rhinitis, sinusitis and urticaria. Primary care is through [facility].Dr Jana Hakim is her oncologist treating breast cancer with history of left lumpectomy, radiation and tamoxifen. She has had a past history of pneumonia, a diagnosis of asthma and bronchitis noted after treatment for the cancer. She has not had either flu vaccine or pneumonia vaccine. She had been treated by Dr Caprice Red over many years for her allergy complaints and had been on allergy vaccine several times. She was to begin again in November of 2010 but apparently did not do so. With his retirement, she is seeking to change practice. She is complaining of fullness and popping in her ears, heavy sensation in the chest but not much itch, sneeze or drainage. Nasal symptoms are usually perennial, worse in the fall and winter with mold exposure. She has had education on avoidance and control of dust and environmental allergens.  She was  skin tested at age 53 because of sinusitis then. She has given her allergy vaccine or had her husband give it, over many years. They also give her husband's insulin injections. Nasal surgery with septoplasty by Dr. Wilburn Cornelia in 2008. Chest x-ray 03/24/2011 no active disease.  08/13/11- 60 yoF never smoker with a long history of allergic rhinitis, asthma/ bronchitis, sinusitis and urticaria, complicated by past hx breast Ca. Cough producing thick yellow sputum. Symbicort helped but she ran out of the sample. Continues allergy vaccine based on skin testing from another office, with our vaccine made here. She says she really feels it helps, especially her head congestion. CXR 03/24/2011-no active disease, normal. PFT 04/29/2011- WNL, FEV1/FVC 0.74.  06/07/12- 60 yoF never smoker with a long history of allergic rhinitis, asthma/ bronchitis, sinusitis and urticaria, complicated by past hx breast Ca. Follows For: Sinus drainage  (green, thick) - Congestion at night - Using saline rinses - Sinus pressure - Ear pressure - Mild chest tightness CTAcute visit-for the past week has had head congestion and won't drain, some chills last night. Frontal headache. Ears full. Postnasal drip without sore throat. Chest tight. She had dropped off allergy shots. Taking clindamycin 150 mg 4 times daily and using squeeze bottle saline nasal rinse.  07/21/12- 49 yoF never smoker with a long history of allergic rhinitis, asthma/ bronchitis, sinusitis and urticaria, complicated by past hx breast Ca ACUTE VISIT: ? sinus infection, unable to sleep due to congestion in head,. Has never really felt her head cleared since last visit despite 10 days Cleocin/ Sudafed then. . Frontal and maxillary pressure, thick mucus. Got worse again last week. Using saline rinse. Dr Wilburn Cornelia did sinus surgery 2008. CT chest 09/01/11 IMPRESSION:  No CT evidence for thoracic metastasis. No acute abnormality.  Original Report Authenticated By: Arline Asp, M.D.  08/30/12- 38 yoF never smoker with a long history of allergic rhinitis, asthma/ bronchitis, sinusitis and urticaria, complicated by past hx breast Ca FOLLOWS FOR: states she got better since last visit but now the symptoms of nasal drainage-dark green in color have returned. Spectracef worked best- "huge difference" compared with other antibiotics ,but didn't last long enough. CT max/fac 07/25/12 IMPRESSION:  Postsurgical changes as described. Acute and chronic bilateral  maxillary sinusitis. Right frontal sinus fluid could be acute or  chronic.  Original Report Authenticated By: Rolla Flatten, M.D.  10/20/12- 35 yoF never smoker with a long history of allergic rhinitis, asthma/ bronchitis, sinusitis and urticaria, complicated by past hx breast Ca ACUTE VISIT: increased  nasal drainage. Using Nasal rinses, Zyrtec, and Singulair. Here for Depo and Nasal Tx today. NEEDS REFILL FOR DEXILANT  and EPI PEN (90day  supply) Complains of postnasal drip pressure in her ears without popping, frontal headache. Past history of care by Dr. Stasia Cavalier  12/05/12- 61 yoF never smoker with a long history of allergic rhinitis,chronic sinusitis,  asthma/ bronchitis,  and urticaria, complicated by past hx breast Ca FOLLOWS FOR: pt reports increased congestion x4 days-- c/o f/c/s, feels congestion has moved from head into chest, prod cough w yellow thick mucus, chest tightness, SOB and wheezing-- using ibuprofen for sx relief.  12/08/12- 61 yoF never smoker with a long history of allergic rhinitis,chronic sinusitis,  asthma/ bronchitis,  and urticaria, complicated by past hx breast Ca ACUTE VISIT: seen 12-05-12 continues to have SOB, congestion, and hard time breathing. Wonders if she needs another breathing tx. Continues cleocin started for sinusitis. Got neb and depo here 3 days ago. Continues thick yellow cough. No fever but achey and tired. Asks work LOA for tomorrow.  12/29/12- 61 yoF never smoker with a long history of allergic rhinitis,chronic sinusitis,  asthma/ bronchitis,  and urticaria, complicated by past hx breast Ca/ L lumpectomy ACUTE VISIT: continues to cough and wheeze; non productive mostly. Had 2 rounds of Cleocin. Then Biaxin did seem to help. Cough is now nonproductive, with wheeze. Ran out of Symbicort. CXR 12/08/12 IMPRESSION:  No evidence of acute cardiopulmonary disease.  Original Report Authenticated By: Charline Bills, M.D. CT max fac 12/28/12 IMPRESSION:  Postsurgical changes as described.  Improved bilateral maxillary sinus disease.  Worsening left frontal sinus disease.  Original Report Authenticated By: Davonna Belling, M.D.  04/25/13-  76 yoF never smoker with a long history of allergic rhinitis,chronic sinusitis,  asthma/ bronchitis,  and urticaria, complicated by past hx breast Ca/ L lumpectomy ACUTE VISIT: currently on abx from Friday 04-21-13; feels like she needs neb tx; having wheezing  and cough(today has been worse day). She had sinus surgery October 5 of Dr. Hilliard Clark. Had taken Biaxin then Avelox. We sent prescription for Cipro to be started after sputum culture was submitted. Unfortunately the laboratory would not accept her sputum until 3 days later when it was obtained in the morning. She had taken 3 days of Cipro by that time. Has just finished prednisone taper. Today feels worse after 5 days of Cipro. Describes hard wheezy cough, dry. Bilateral maxillary sinus pressure pain. She prefers Symbicort 160 over [place].  05/08/13- 61 yoF never smoker with a long history of allergic rhinitis,chronic sinusitis,  asthma/ bronchitis,  and urticaria, complicated by past hx breast Ca/ L lumpectomy FOLLOWS FOR:  Still having cough with green/yellow mucus.  Some better since on pred Better now while on prednisone burst. Still blowing thick green from sinuses and chest with bad odor. Not affected by several antibiotics. 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 Aspergillus antibody screen is pending. Allergy Profile 05/01/2013-total IgE 104.5 Skin test 05/08/2013-negative for Aspergillus with appropriate controls. Took Zyrtec last night Discussed possible referral to [facility] and allergy  ROS-see HPI Constitutional:   No-   weight loss, night sweats, fevers, chills, fatigue, lassitude. HEENT:   + headaches, difficulty swallowing, tooth/dental problems, sore throat,       No-  sneezing, itching, ear ache, +nasal congestion, +post nasal drip,  CV:  No-   chest pain, orthopnea, PND, swelling in lower extremities, anasarca, dizziness, palpitations Resp: No-   shortness of  breath with exertion or at rest.              + productive cough, + non-productive cough,  No- coughing up of blood.            +change in color of mucus.  + wheezing, chest tightness..   Skin: No-   rash or lesions. GI:  No-   heartburn,  indigestion, abdominal pain, nausea, vomiting,  GU:  MS:  No-   joint pain or swelling.  . Neuro-     nothing unusual Psych:  No- change in mood or affect. No depression or anxiety.  No memory loss.  OBJ General- Alert, Oriented, Affect-appropriate, Distress- none acute. Skin feels warm. Skin- rash-none, lesions- none, excoriation- none Lymphadenopathy- none Head- atraumatic            Eyes- Gross vision intact, PERRLA, conjunctivae clear secretions            Ears- Hearing, canals-unremarkable            Nose- +turbinate edema, no-Septal dev, polyps, erosion, perforation             Throat- Mallampati II , mucosa clear, drainage- none, tonsils- atrophic Neck- flexible , trachea midline, no stridor , thyroid nl, carotid no bruit Chest - symmetrical excursion , unlabored           Heart/CV- RRR , no murmur , no gallop  , no rub, nl s1 s2                           - JVD- none , edema- none, stasis changes- none, varices- none           Lung- + Slight wheeze, cough+    No-  dullness, rub- none           Chest wall-  Abd-  Br/ Gen/ Rectal- Not done, not indicated Extrem- cyanosis- none, clubbing, none, atrophy- none, strength- nl Neuro- grossly intact to observation

## 2013-05-08 NOTE — Patient Instructions (Addendum)
Skin test- aspergillus, controls   done  Scripts sent for diflucan and prednisone. As you finish the prednisone taper, shift to taking 10 mg daily

## 2013-05-08 NOTE — Telephone Encounter (Signed)
LMTCB-needs to speak DIRECTLY with [name].

## 2013-05-08 NOTE — Telephone Encounter (Signed)
Spoke with Linda-she wanted to make sure that patient does not have TB and needed to be treated. Per CY he does not suspect TB at this time and we are following patient for care. She will relay message to her staff(who received a call from the state about AFB from recent sputum culture) and if they have any other questions or concerns they will contact me directly.

## 2013-05-22 LAB — FUNGUS CULTURE W SMEAR: Smear Result: NONE SEEN

## 2013-05-29 ENCOUNTER — Telehealth: Payer: Self-pay | Admitting: Internal Medicine

## 2013-05-29 MED ORDER — CEFDINIR 300 MG PO CAPS: 300.0000 mg | ORAL_CAPSULE | Freq: Two times a day (BID) | ORAL | Status: DC

## 2013-05-29 NOTE — Telephone Encounter (Signed)
Called, spoke with pt.  Informed her of below recs per CDY.  She verbalized understanding of this, is aware cefdinir was sent to [facility], and will keep OV scheduled for tomorrow with CDY.

## 2013-05-29 NOTE — Telephone Encounter (Signed)
Per CY-give cefdinir 300 mg #14 take 1 po BID no refills. Thanks.

## 2013-05-29 NOTE — Telephone Encounter (Signed)
Called, spoke with pt.  C/o prod cough with dark green mucus, HA, nasal and chest congestion, SOB that "comes and goes," chest tightness, and a little wheezing x 6 days.  Felt feverish last week.  No body aches.  Is on pred 10 mg qd, taking mucinex, and using salt water nasal irrigations.  Has OV with CDY tomorrow - requesting rx to start today.  Please advise. Thank you.  Last OV with CDY: 05/08/13  Allergies verified with pt:  Allergies  Allergen Reactions  . Amoxicillin-Pot Clavulanate Nausea And Vomiting  . Doxycycline Nausea And Vomiting    Current meds:  Current Outpatient Prescriptions on File Prior to Visit  Medication Sig Dispense Refill  . acetaminophen (TYLENOL) 500 MG tablet Take 1,000 mg by mouth every 6 (six) hours as needed for pain.      Marland Kitchen ALPRAZolam (XANAX) 0.25 MG tablet Take 0.25 mg by mouth at bedtime as needed.        Marland Kitchen aspirin 81 MG tablet Take 81 mg by mouth daily.        . benzonatate (TESSALON) 200 MG capsule Take 1 capsule (200 mg total) by mouth every 8 (eight) hours as needed for cough.  30 capsule  PRN  . brinzolamide (AZOPT) 1 % ophthalmic suspension Place 1 drop into both eyes 2 (two) times daily.        . cetirizine (ZYRTEC) 10 MG tablet Take 1 tablet (10 mg total) by mouth daily.  90 tablet  3  . cholecalciferol (VITAMIN D) 400 UNITS TABS Take 400 Units by mouth daily.       . clindamycin (CLEOCIN) 300 MG capsule 1 twice daily  14 capsule  0  . dexlansoprazole (DEXILANT) 60 MG capsule Take 1 capsule (60 mg total) by mouth daily.  90 capsule  3  . EPINEPHrine (EPIPEN 2-PAK) 0.3 mg/0.3 mL DEVI Inject 0.3 mLs (0.3 mg total) into the muscle once.  1 Device  3  . escitalopram (LEXAPRO) 20 MG tablet Take 20 mg by mouth daily.      Marland Kitchen estradiol (ESTRACE) 0.1 MG/GM vaginal cream Place 1.51 Applicatorfuls vaginally daily.  42.5 g  0  . fluconazole (DIFLUCAN) 150 MG tablet Take 1 tablet (150 mg total) by mouth daily.  3 tablet  0  . fluconazole (DIFLUCAN) 150 MG  tablet Take 1 tablet (150 mg total) by mouth daily.  7 tablet  1  . Fluticasone Furoate-Vilanterol (BREO ELLIPTA) 100-25 MCG/INH AEPB Inhale 1 puff into the lungs daily.  28 each  prn  . gabapentin (NEURONTIN) 300 MG capsule Take 1 capsule (300 mg total) by mouth at bedtime.  90 capsule  12  . HYDROcodone-acetaminophen (NORCO) 5-325 MG per tablet Take 1-2 tablets by mouth every 6 (six) hours as needed for pain.  30 tablet  0  . latanoprost (XALATAN) 0.005 % ophthalmic solution Place 1 drop into both eyes at bedtime.        . mometasone (NASONEX) 50 MCG/ACT nasal spray Place 2 sprays into the nose daily.  51 g  3  . montelukast (SINGULAIR) 10 MG tablet Take 1 tablet (10 mg total) by mouth at bedtime.  90 tablet  3  . predniSONE (DELTASONE) 10 MG tablet 6 x 2 days, 5 x 2 days, 4 x 2 days, 3 x 2 days, 2 x 2 days, 1 x 2 days, then stop  50 tablet  0  . predniSONE (DELTASONE) 10 MG tablet 1 daily  30 tablet  0  .  tamoxifen (NOLVADEX) 20 MG tablet Take 1 tablet (20 mg total) by mouth daily.  90 tablet  3   No current facility-administered medications on file prior to visit.

## 2013-05-30 ENCOUNTER — Ambulatory Visit (INDEPENDENT_AMBULATORY_CARE_PROVIDER_SITE_OTHER): Admitting: Internal Medicine

## 2013-05-30 ENCOUNTER — Encounter: Payer: Self-pay | Admitting: Internal Medicine

## 2013-05-30 VITALS — BP 148/62 | HR 81 | Ht 62.0 in | Wt 164.0 lb

## 2013-05-30 DIAGNOSIS — J320 Chronic maxillary sinusitis: Secondary | ICD-10-CM

## 2013-05-30 DIAGNOSIS — J329 Chronic sinusitis, unspecified: Secondary | ICD-10-CM

## 2013-05-30 DIAGNOSIS — J45901 Unspecified asthma with (acute) exacerbation: Secondary | ICD-10-CM

## 2013-05-30 NOTE — Patient Instructions (Addendum)
Order- referral to Dr Neldon Mc- immunology- chronic recurrent sinusitis,   Finish cefdinir  Flu vax

## 2013-05-30 NOTE — Progress Notes (Signed)
04/28/11- 60 yoF never smoker with a long history of allergic rhinitis, sinusitis and urticaria. Primary care is through [facility].Dr Jana Hakim is her oncologist treating breast cancer with history of left lumpectomy, radiation and tamoxifen. She has had a past history of pneumonia, a diagnosis of asthma and bronchitis noted after treatment for the cancer. She has not had either flu vaccine or pneumonia vaccine. She had been treated by Dr Caprice Red over many years for her allergy complaints and had been on allergy vaccine several times. She was to begin again in November of 2010 but apparently did not do so. With his retirement, she is seeking to change practice. She is complaining of fullness and popping in her ears, heavy sensation in the chest but not much itch, sneeze or drainage. Nasal symptoms are usually perennial, worse in the fall and winter with mold exposure. She has had education on avoidance and control of dust and environmental allergens.  She was  skin tested at age 53 because of sinusitis then. She has given her allergy vaccine or had her husband give it, over many years. They also give her husband's insulin injections. Nasal surgery with septoplasty by Dr. Wilburn Cornelia in 2008. Chest x-ray 03/24/2011 no active disease.  08/13/11- 60 yoF never smoker with a long history of allergic rhinitis, asthma/ bronchitis, sinusitis and urticaria, complicated by past hx breast Ca. Cough producing thick yellow sputum. Symbicort helped but she ran out of the sample. Continues allergy vaccine based on skin testing from another office, with our vaccine made here. She says she really feels it helps, especially her head congestion. CXR 03/24/2011-no active disease, normal. PFT 04/29/2011- WNL, FEV1/FVC 0.74.  06/07/12- 60 yoF never smoker with a long history of allergic rhinitis, asthma/ bronchitis, sinusitis and urticaria, complicated by past hx breast Ca. Follows For: Sinus drainage  (green, thick) - Congestion at night - Using saline rinses - Sinus pressure - Ear pressure - Mild chest tightness CTAcute visit-for the past week has had head congestion and won't drain, some chills last night. Frontal headache. Ears full. Postnasal drip without sore throat. Chest tight. She had dropped off allergy shots. Taking clindamycin 150 mg 4 times daily and using squeeze bottle saline nasal rinse.  07/21/12- 49 yoF never smoker with a long history of allergic rhinitis, asthma/ bronchitis, sinusitis and urticaria, complicated by past hx breast Ca ACUTE VISIT: ? sinus infection, unable to sleep due to congestion in head,. Has never really felt her head cleared since last visit despite 10 days Cleocin/ Sudafed then. . Frontal and maxillary pressure, thick mucus. Got worse again last week. Using saline rinse. Dr Wilburn Cornelia did sinus surgery 2008. CT chest 09/01/11 IMPRESSION:  No CT evidence for thoracic metastasis. No acute abnormality.  Original Report Authenticated By: Arline Asp, M.D.  08/30/12- 38 yoF never smoker with a long history of allergic rhinitis, asthma/ bronchitis, sinusitis and urticaria, complicated by past hx breast Ca FOLLOWS FOR: states she got better since last visit but now the symptoms of nasal drainage-dark green in color have returned. Spectracef worked best- "huge difference" compared with other antibiotics ,but didn't last long enough. CT max/fac 07/25/12 IMPRESSION:  Postsurgical changes as described. Acute and chronic bilateral  maxillary sinusitis. Right frontal sinus fluid could be acute or  chronic.  Original Report Authenticated By: Rolla Flatten, M.D.  10/20/12- 35 yoF never smoker with a long history of allergic rhinitis, asthma/ bronchitis, sinusitis and urticaria, complicated by past hx breast Ca ACUTE VISIT: increased  nasal drainage. Using Nasal rinses, Zyrtec, and Singulair. Here for Depo and Nasal Tx today. NEEDS REFILL FOR DEXILANT  and EPI PEN (90day  supply) Complains of postnasal drip pressure in her ears without popping, frontal headache. Past history of care by Dr. Lovenia Shuck  12/05/12- 61 yoF never smoker with a long history of allergic rhinitis,chronic sinusitis,  asthma/ bronchitis,  and urticaria, complicated by past hx breast Ca FOLLOWS FOR: pt reports increased congestion x4 days-- c/o f/c/s, feels congestion has moved from head into chest, prod cough w yellow thick mucus, chest tightness, SOB and wheezing-- using ibuprofen for sx relief.  12/08/12- 61 yoF never smoker with a long history of allergic rhinitis,chronic sinusitis,  asthma/ bronchitis,  and urticaria, complicated by past hx breast Ca ACUTE VISIT: seen 12-05-12 continues to have SOB, congestion, and hard time breathing. Wonders if she needs another breathing tx. Continues cleocin started for sinusitis. Got neb and depo here 3 days ago. Continues thick yellow cough. No fever but achey and tired. Asks work LOA for tomorrow.  12/29/12- 33 yoF never smoker with a long history of allergic rhinitis,chronic sinusitis,  asthma/ bronchitis,  and urticaria, complicated by past hx breast Ca/ L lumpectomy ACUTE VISIT: continues to cough and wheeze; non productive mostly. Had 2 rounds of Cleocin. Then Biaxin did seem to help. Cough is now nonproductive, with wheeze. Ran out of Symbicort. CXR 12/08/12 IMPRESSION:  No evidence of acute cardiopulmonary disease.  Original Report Authenticated By: Julian Hy, M.D. CT max fac 12/28/12 IMPRESSION:  Postsurgical changes as described.  Improved bilateral maxillary sinus disease.  Worsening left frontal sinus disease.  Original Report Authenticated By: Rolla Flatten, M.D.  04/25/13-  65 yoF never smoker with a long history of allergic rhinitis,chronic sinusitis,  asthma/ bronchitis,  and urticaria, complicated by past hx breast Ca/ L lumpectomy ACUTE VISIT: currently on abx from Friday 04-21-13; feels like she needs neb tx; having wheezing  and cough(today has been worse day). She had sinus surgery October 5 of Dr. Jerrye Noble. Had taken Biaxin then Avelox. We sent prescription for Cipro to be started after sputum culture was submitted. Unfortunately the laboratory would not accept her sputum until 3 days later when it was obtained in the morning. She had taken 3 days of Cipro by that time. Has just finished prednisone taper. Today feels worse after 5 days of Cipro. Describes hard wheezy cough, dry. Bilateral maxillary sinus pressure pain. She prefers Symbicort 160 over [place].  05/08/13- 55 yoF never smoker with a long history of allergic rhinitis,chronic sinusitis,  asthma/ bronchitis,  and urticaria, complicated by past hx breast Ca/ L lumpectomy FOLLOWS FOR:  Still having cough with green/yellow mucus.  Some better since on pred  Mold Allergy- elevated for Aspergillus. F/U lab pending.   05/30/13- [name]  never smoker with a long history of allergic rhinitis,chronic sinusitis,  asthma/ bronchitis,  and urticaria, complicated by past hx breast Ca/ L lumpectomy    Friend here FOLLOWS FOR:c/o  cough-green,np at times,chest tightness,sob worse,hoarseness last week,wheezing,fcs over wkend,started Cipro yesterday Repeatedly clears with antibiotics, then within weeks again has purulent nasal discharge. Seemed better for awhile on maintenance prednisone 10mg  daily. Dr Wilburn Cornelia did repeat sinus surgery Oct, 2014, removing some polyps (no hx ASA allergy). She sees little improvement.  Now acute exacerb started 5 days ago with initial fever, no myalgias or sore throat. Green from nose and chest. No nodes, rash or arthralgias. She called and we started cefdinir yest. Sputum cx 04/24/13-  had been on Cipro x 3 days- heavy growth "nl flora", candida, AFB smear neg. Labs- 05/03/13-  Allergy profile Total IgE 104.5, Pos mainly for molds, incl Aspergillus 5.33. Asks refill Symbicort  ROS-see HPI Constitutional:   No-   weight loss, night sweats,  fevers, chills, fatigue, lassitude. HEENT:   + headaches, difficulty swallowing, tooth/dental problems, sore throat,       No-  sneezing, itching, ear ache, +nasal congestion, +post nasal drip,  CV:  No-   chest pain, orthopnea, PND, swelling in lower extremities, anasarca, dizziness, palpitations Resp: No-   shortness of breath with exertion or at rest.              +productive cough, + non-productive cough,  No- coughing up of blood.             No- change in color of mucus.  + wheezing, chest tightness..   Skin: No-   rash or lesions. GI:  No-   heartburn, indigestion, abdominal pain, nausea, vomiting,  GU:  MS:  No-   joint pain or swelling.  . Neuro-     nothing unusual Psych:  No- change in mood or affect. No depression or anxiety.  No memory loss.  OBJ General- Alert, Oriented, Affect-appropriate, Distress- none acute.  Skin- rash-none, lesions- none, excoriation- none Lymphadenopathy- none Head- atraumatic            Eyes- Gross vision intact, PERRLA, conjunctivae clear secretions            Ears- Hearing, canals-unremarkable            Nose- +turbinate edema,   + minor crusting, no-Septal dev, polyps, erosion, perforation             Throat- Mallampati II , mucosa clear, drainage- none, tonsils- atrophic Neck- flexible , trachea midline, no stridor , thyroid nl, carotid no bruit Chest - symmetrical excursion , unlabored           Heart/CV- RRR , no murmur , no gallop  , no rub, nl s1 s2                           - JVD- none , edema- none, stasis changes- none, varices- none           Lung- + bilateral wheeze, cough+ Heart ,   No-  dullness, rub- none           Chest wall- +hx L breast lumpectomy/ XRT Abd-  Br/ Gen/ Rectal- Not done, not indicated Extrem- cyanosis- none, clubbing, none, atrophy- none, strength- nl Neuro- grossly intact to observation

## 2013-05-30 NOTE — Assessment & Plan Note (Signed)
Fair control until acute bronchitis. This is part of the same chronic upper airway inflammation pattern.  Plan I recommended immunology consult with Dr Neldon Mc for his thoughts.

## 2013-05-30 NOTE — Assessment & Plan Note (Signed)
Chronic recurrent sinusitis. We don't find immunodeficiency work-up in current system. Imaging has not shown "finger in glove" bur we discussed possibility of an ABPA type syndrome. P- she is finishing cefdinir now. I have suggested refrerral for immunologic consideration and she agrees.

## 2013-06-03 NOTE — Assessment & Plan Note (Signed)
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 I would not expect thick green sputum based on that. Plan-await full culture results

## 2013-06-03 NOTE — Assessment & Plan Note (Signed)
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 Still consider possibility this is a form of allergic fungal sinusitis. Plan-consider referral as discussed, especially [facility]. She will followup with Dr. Wilburn Cornelia. Diflucan 150 mg #7in case of yeast vaginitis.. Continue prednisone 10 mg daily.

## 2013-06-05 LAB — AFB CULTURE WITH SMEAR (NOT AT ARMC): Acid Fast Smear: NONE SEEN

## 2013-06-07 ENCOUNTER — Telehealth: Payer: Self-pay | Admitting: Internal Medicine

## 2013-06-07 NOTE — Telephone Encounter (Signed)
lmomtcb x1 for [name]

## 2013-06-08 NOTE — Telephone Encounter (Signed)
Called and spoke with lee ann. She is needing the final result from sputum dated 04/24/13. I advised her of results. Nothing further needed

## 2013-06-08 NOTE — Telephone Encounter (Signed)
[name] is returning call.  If she doesn't answer, please leave a message with the results on her voicemail.  She is the only one who has access to this voicemail.  Thanks![name]

## 2013-07-14 ENCOUNTER — Telehealth: Payer: Self-pay | Admitting: Internal Medicine

## 2013-07-14 NOTE — Telephone Encounter (Signed)
Called spoke with pt. She c/o chest and nasal congestion, prod cough w/ dark yellow phlem, wheezing, chest tx, slight PND. She is using her inahlers, singulair, zyrtec, mucinex. Please advise Dr. Annamaria Boots thanks  Allergies  Allergen Reactions  . Amoxicillin-Pot Clavulanate Nausea And Vomiting  . Doxycycline Nausea And Vomiting    Current Outpatient Prescriptions on File Prior to Visit  Medication Sig Dispense Refill  . acetaminophen (TYLENOL) 500 MG tablet Take 1,000 mg by mouth every 6 (six) hours as needed for pain.      Marland Kitchen ALPRAZolam (XANAX) 0.25 MG tablet Take 0.25 mg by mouth at bedtime as needed.        Marland Kitchen aspirin 81 MG tablet Take 81 mg by mouth daily.        . benzonatate (TESSALON) 200 MG capsule Take 1 capsule (200 mg total) by mouth every 8 (eight) hours as needed for cough.  30 capsule  PRN  . brinzolamide (AZOPT) 1 % ophthalmic suspension Place 1 drop into both eyes 2 (two) times daily.        . budesonide-formoterol (SYMBICORT) 160-4.5 MCG/ACT inhaler Inhale 2 puffs into the lungs 2 (two) times daily.      . cefdinir (OMNICEF) 300 MG capsule Take 300 mg by mouth 2 (two) times daily.      . cetirizine (ZYRTEC) 10 MG tablet Take 1 tablet (10 mg total) by mouth daily.  90 tablet  3  . cholecalciferol (VITAMIN D) 400 UNITS TABS Take 400 Units by mouth daily.       Marland Kitchen dexlansoprazole (DEXILANT) 60 MG capsule Take 1 capsule (60 mg total) by mouth daily.  90 capsule  3  . EPINEPHrine (EPIPEN 2-PAK) 0.3 mg/0.3 mL DEVI Inject 0.3 mLs (0.3 mg total) into the muscle once.  1 Device  3  . escitalopram (LEXAPRO) 20 MG tablet Take 20 mg by mouth daily.      Marland Kitchen estradiol (ESTRACE) 0.1 MG/GM vaginal cream Place 6.57 Applicatorfuls vaginally daily.  42.5 g  0  . fluconazole (DIFLUCAN) 150 MG tablet Take 1 tablet (150 mg total) by mouth daily.  3 tablet  0  . gabapentin (NEURONTIN) 300 MG capsule Take 1 capsule (300 mg total) by mouth at bedtime.  90 capsule  12  . HYDROcodone-acetaminophen (NORCO)  5-325 MG per tablet Take 1-2 tablets by mouth every 6 (six) hours as needed for pain.  30 tablet  0  . latanoprost (XALATAN) 0.005 % ophthalmic solution Place 1 drop into both eyes at bedtime.        . mometasone (NASONEX) 50 MCG/ACT nasal spray Place 2 sprays into the nose daily.  51 g  3  . montelukast (SINGULAIR) 10 MG tablet Take 1 tablet (10 mg total) by mouth at bedtime.  90 tablet  3  . predniSONE (DELTASONE) 10 MG tablet 1 daily  30 tablet  0  . tamoxifen (NOLVADEX) 20 MG tablet Take 1 tablet (20 mg total) by mouth daily.  90 tablet  3   No current facility-administered medications on file prior to visit.

## 2013-07-14 NOTE — Telephone Encounter (Signed)
lmtcb x1 

## 2013-07-14 NOTE — Telephone Encounter (Signed)
Per CY-offer Zpak #1 take as directed no refills. Thanks.  

## 2013-07-17 MED ORDER — AZITHROMYCIN 250 MG PO TABS: ORAL_TABLET | ORAL | Status: DC

## 2013-07-17 NOTE — Telephone Encounter (Signed)
Pt advised and rx sent. Jasmon Graffam, CMA  

## 2013-07-20 LAB — REFERRED ASSAY

## 2013-09-26 ENCOUNTER — Other Ambulatory Visit: Payer: Self-pay | Admitting: Oncology

## 2013-09-26 ENCOUNTER — Other Ambulatory Visit: Payer: Self-pay | Admitting: *Deleted

## 2013-09-26 DIAGNOSIS — Z853 Personal history of malignant neoplasm of breast: Secondary | ICD-10-CM

## 2013-09-26 DIAGNOSIS — Z9889 Other specified postprocedural states: Secondary | ICD-10-CM

## 2013-10-05 ENCOUNTER — Ambulatory Visit
Admission: RE | Admit: 2013-10-05 | Discharge: 2013-10-05 | Disposition: A | Discharge status: Home / Self Care | Source: Ambulatory Visit | Attending: Oncology | Admitting: Oncology

## 2013-10-05 ENCOUNTER — Encounter

## 2013-10-05 ENCOUNTER — Telehealth: Payer: Self-pay | Admitting: Oncology

## 2013-10-05 DIAGNOSIS — Z853 Personal history of malignant neoplasm of breast: Secondary | ICD-10-CM

## 2013-10-05 DIAGNOSIS — Z9889 Other specified postprocedural states: Secondary | ICD-10-CM

## 2013-10-05 NOTE — Telephone Encounter (Signed)
s.wk. pt and sched f/u appt...done...pt aware of new time

## 2013-10-16 ENCOUNTER — Ambulatory Visit (HOSPITAL_BASED_OUTPATIENT_CLINIC_OR_DEPARTMENT_OTHER)

## 2013-10-16 ENCOUNTER — Encounter: Payer: Self-pay | Admitting: Physician Assistant

## 2013-10-16 ENCOUNTER — Ambulatory Visit (HOSPITAL_BASED_OUTPATIENT_CLINIC_OR_DEPARTMENT_OTHER): Admitting: Physician Assistant

## 2013-10-16 ENCOUNTER — Telehealth: Payer: Self-pay | Admitting: Physician Assistant

## 2013-10-16 VITALS — BP 143/85 | HR 79 | Temp 97.8°F | Resp 18 | Ht 62.0 in | Wt 165.8 lb

## 2013-10-16 DIAGNOSIS — C50919 Malignant neoplasm of unspecified site of unspecified female breast: Secondary | ICD-10-CM

## 2013-10-16 DIAGNOSIS — Z8639 Personal history of other endocrine, nutritional and metabolic disease: Secondary | ICD-10-CM

## 2013-10-16 DIAGNOSIS — Z853 Personal history of malignant neoplasm of breast: Secondary | ICD-10-CM

## 2013-10-16 DIAGNOSIS — Z170 Estrogen receptor positive status [ER+]: Secondary | ICD-10-CM

## 2013-10-16 DIAGNOSIS — F17200 Nicotine dependence, unspecified, uncomplicated: Secondary | ICD-10-CM

## 2013-10-16 LAB — CBC WITH DIFFERENTIAL/PLATELET
BASO%: 0.9 % (ref 0.0–2.0)
Basophils Absolute: 0 10*3/uL (ref 0.0–0.1)
EOS%: 7.8 % — AB (ref 0.0–7.0)
Eosinophils Absolute: 0.4 10*3/uL (ref 0.0–0.5)
HEMATOCRIT: 41 % (ref 34.8–46.6)
HGB: 13.5 g/dL (ref 11.6–15.9)
LYMPH%: 23.1 % (ref 14.0–49.7)
MCH: 28.8 pg (ref 25.1–34.0)
MCHC: 32.9 g/dL (ref 31.5–36.0)
MCV: 87.4 fL (ref 79.5–101.0)
MONO#: 0.5 10*3/uL (ref 0.1–0.9)
MONO%: 10.1 % (ref 0.0–14.0)
NEUT#: 3.1 10*3/uL (ref 1.5–6.5)
NEUT%: 58.1 % (ref 38.4–76.8)
Platelets: 282 10*3/uL (ref 145–400)
RBC: 4.69 10*6/uL (ref 3.70–5.45)
RDW: 13 % (ref 11.2–14.5)
WBC: 5.3 10*3/uL (ref 3.9–10.3)
lymph#: 1.2 10*3/uL (ref 0.9–3.3)

## 2013-10-16 LAB — COMPREHENSIVE METABOLIC PANEL (CC13)
ALK PHOS: 121 U/L (ref 40–150)
ALT: 20 U/L (ref 0–55)
AST: 24 U/L (ref 5–34)
Albumin: 3.8 g/dL (ref 3.5–5.0)
Anion Gap: 13 mEq/L — ABNORMAL HIGH (ref 3–11)
BILIRUBIN TOTAL: 0.29 mg/dL (ref 0.20–1.20)
BUN: 10.8 mg/dL (ref 7.0–26.0)
CO2: 24 mEq/L (ref 22–29)
CREATININE: 0.9 mg/dL (ref 0.6–1.1)
Calcium: 9 mg/dL (ref 8.4–10.4)
Chloride: 104 mEq/L (ref 98–109)
Glucose: 83 mg/dl (ref 70–140)
Potassium: 3.5 mEq/L (ref 3.5–5.1)
Sodium: 141 mEq/L (ref 136–145)
Total Protein: 6.9 g/dL (ref 6.4–8.3)

## 2013-10-16 NOTE — Progress Notes (Signed)
ID: [name]   DOB: [birth year]  MR#: [id number]  CSN#:[id number]   PCP:  Catherine Polio, NP GYN:  Catherine Salina, MD SU: Catherine Confer, MD OTHER MD: Catherine Barre, MD;  Catherine Belfast, MD  CHIEF COMPLAINT:  Hx of Left  Breast Cancer (tamoxifen)   HISTORY OF PRESENT ILLNESS: [name] had routine screening mammography March 29th, 2011 at the [facility].  This showed a possible mass in the left breast.  She was recalled for additional studies on April 5th.  This study demonstrated a focal mass in the outer mid-portion of the left breast with minimal irregularity.  Ultrasound showed an ovoid hypoechoic mass measuring up to 8 mm.  Again, there was minimal irregularity noted but biopsy was suggested and performed the same day.  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  The proliferation marker was 19% and there was no evidence of Her-2 amplification by CISH with a ratio of 1.03.  With this information, the patient was referred to Dr. Zella Duran and bilateral breast MRIs were obtained on August 27, 2009.  This showed an 11 millimeter mass in the central portion of the left breast correlated with the known malignancy.  There were no other areas suspicious for cancer and on April 26th, 2011, the patient underwent left axillary sentinel lymph node mapping and sampling and left lumpectomy with the final pathology from that procedure ([id number]) showing a 1.3 cm Grade 1 invasive ductal carcinoma, with negative margins, and 0 of 4 sentinel lymph nodes involved.  Her subsequent history is as detailed below  INTERVAL HISTORY: [name] returns alone today for a routine six-month follow-up of her left breast cancer. [name] continues on tamoxifen which she is tolerating well. She has only occasional hot flashes, nothing she considers problematic. She does think the gabapentin has  been helpful. She has mild vaginal dryness. She's had no abnormal bleeding, no clotting, and no change in vision.   The interval history is notable for the fact that [name] has continued to followup with her pulmonologist for her chronic respiratory issues. She has been diagnosed with asthmatic bronchitis, and is finally off the oral prednisone. She continues to have some sinus congestion which is chronic. [name]'s husband still has quite a few health problems, and continues to smoke. For the first time in a long time, however, [name] tells me they are planning a vacation this summer. They're going on a cruise to the [name] in [place].   REVIEW OF SYSTEMS: [name] denies any recent fevers, chills, or night sweats. She worries about her weight gain. Her energy level is good, and she has been exercising more regularly. She occasionally has some difficulty sleeping. She's had no skin changes or rashes and denies any abnormal bruising or bleeding. Her appetite is good. She denies any nausea or change in bowel or bladder habits. Of course she continues to have a cough, some wheezing, and shortness of breath, and continues on inhalers as needed. She's had no chest pain or palpitations. She denies any abnormal headaches or dizziness. Currently, she denies any new or unusual myalgias, arthralgias, or bony pain.  A detailed review of systems is otherwise stable and noncontributory.    PAST MEDICAL HISTORY: Past Medical History  Diagnosis Date  . Breast cancer 03/24/2011  . Asthma   . Allergy history unknown   . Sinusitis   . PONV (postoperative nausea and vomiting)   .  Shortness of breath     asthma  . Pneumonia     hx of  . Bronchitis   . GERD (gastroesophageal reflux disease)   . Headache(784.0)   . Dizziness   . Glaucoma   Significant for glaucoma, status post laser surgery bilaterally.  History of unilateral salpingo-oophorectomy.  She believes there was a left ovary that was removed.  History of trauma  to the left knee with kneecap fracture, not requiring surgery, followed by [name].  History of anxiety disorder.  History of bilateral blepharoplasty.  History of septoplasty.  History of two tooth implants in the right maxilla.  Question of possible osteopenia.  History of benign skin biopsies.    PAST SURGICAL HISTORY: Past Surgical History  Procedure Laterality Date  . Breast lumpectomy  08-2009    left   . Nose surgery  12-2006  . Oophorectomy  2001  . Eye surgery Bilateral     laser surgery - glaucoma  . Eyelid surgery Bilateral     for glaucoma  . Sinus endo w/fusion Bilateral 02/16/2013    Procedure: ENDOSCOPIC SINUS SURGERY WITH FUSION NAVIGATION;  Surgeon: Catherine Belfast, MD;  Location: [facility];  Service: ENT;  Laterality: Bilateral;    FAMILY HISTORY Family History  Problem Relation Age of Onset  . Breast cancer Mother   . Asthma Brother   . Emphysema Father   . Heart disease Father   The patient's father died at the age of 52 from emphysema.  He was a Building control surveyor.  The patient's mother is alive at age 57.  She had a myocardial infarction about 10 years ago. The patient has two brothers.  There is no history of breast or ovarian cancer in the family.  GYNECOLOGIC HISTORY:  (Updated 10/16/2013) She is GX P1.  First pregnancy to term at age 81.  She understands that increases the risk of breast cancer developing.  Last menstrual period was about 10 years ago and she took hormones for about five years.    SOCIAL HISTORY:  (Reviewed 10/16/2013) She works as a Quarry manager.  Her husband [name] is disabled secondary to [employer], diabetes, and peripheral vascular disease. He also has a history of kidney cancer. Their daughter, [name], graduated from college in nutrition. She was  married in 2011. The patient's husband also has 4 children from a prior marriage, and 9 grandchildren. The patient attends the [employer] in [place].     ADVANCED  DIRECTIVES: In place  HEALTH MAINTENANCE:  (Updated 10/16/2013) History  Substance Use Topics  . Smoking status: Never Smoker   . Smokeless tobacco: Never Used  . Alcohol Use: Yes     Comment: rarely/socially     Colonoscopy:  Not on file  PAP: Not on file/Dr. Cousins  Bone density: July 2011/ mild osteopenia  Lipid panel: UTD/Dr. Noberto Retort ([place])   Allergies  Allergen Reactions  . Amoxicillin-Pot Clavulanate Nausea And Vomiting  . Doxycycline Nausea And Vomiting    Current Outpatient Prescriptions  Medication Sig Dispense Refill  . aspirin 81 MG tablet Take 81 mg by mouth daily.        . beclomethasone (QVAR) 80 MCG/ACT inhaler Inhale 2 puffs into the lungs 2 (two) times daily.      . brinzolamide (AZOPT) 1 % ophthalmic suspension Place 1 drop into both eyes 2 (two) times daily.        . cetirizine (ZYRTEC) 10 MG tablet Take 1 tablet (10 mg total) by mouth  daily.  90 tablet  3  . cholecalciferol (VITAMIN D) 400 UNITS TABS Take 400 Units by mouth daily.       Marland Kitchen escitalopram (LEXAPRO) 20 MG tablet Take 20 mg by mouth daily.      Marland Kitchen estradiol (ESTRACE) 0.1 MG/GM vaginal cream Place 9.67 Applicatorfuls vaginally daily.  42.5 g  0  . gabapentin (NEURONTIN) 300 MG capsule Take 1 capsule (300 mg total) by mouth at bedtime.  90 capsule  12  . latanoprost (XALATAN) 0.005 % ophthalmic solution Place 1 drop into both eyes at bedtime.        . mometasone-formoterol (DULERA) 100-5 MCG/ACT AERO Inhale 2 puffs into the lungs 2 (two) times daily.      Marland Kitchen omalizumab (XOLAIR) 150 MG injection Inject 150 mg into the skin every 28 (twenty-eight) days.      . tamoxifen (NOLVADEX) 20 MG tablet Take 1 tablet (20 mg total) by mouth daily.  90 tablet  3  . acetaminophen (TYLENOL) 500 MG tablet Take 1,000 mg by mouth every 6 (six) hours as needed for pain.      Marland Kitchen ALPRAZolam (XANAX) 0.25 MG tablet Take 0.25 mg by mouth at bedtime as needed.        . benzonatate (TESSALON) 200 MG capsule Take 1 capsule  (200 mg total) by mouth every 8 (eight) hours as needed for cough.  30 capsule  PRN  . EPINEPHrine (EPIPEN 2-PAK) 0.3 mg/0.3 mL DEVI Inject 0.3 mLs (0.3 mg total) into the muscle once.  1 Device  3  . fluconazole (DIFLUCAN) 150 MG tablet Take 1 tablet (150 mg total) by mouth daily.  3 tablet  0  . HYDROcodone-acetaminophen (NORCO) 5-325 MG per tablet Take 1-2 tablets by mouth every 6 (six) hours as needed for pain.  30 tablet  0  . montelukast (SINGULAIR) 10 MG tablet Take 1 tablet (10 mg total) by mouth at bedtime.  90 tablet  3   No current facility-administered medications for this visit.    OBJECTIVE: middle aged white woman who appears comfortable, and is in no acute distress Filed Vitals:   10/16/13 1200  BP: 143/85  Pulse: 79  Temp: 97.8 F (36.6 C)  Resp: 18     Body mass index is 30.32 kg/(m^2).    ECOG FS: 0 Filed Weights   10/16/13 1200  Weight: 165 lb 12.8 oz (75.206 kg)   Physical Exam: HEENT:  Sclerae anicteric.  Oropharynx clear, pink, and moist. No ulcerations or candidiasis. Neck supple, trachea midline. No thyromegaly palpated.  NODES:  No cervical or supraclavicular lymphadenopathy palpated.  BREAST EXAM: Right breast is unremarkable. Left breast is status post lumpectomy. No suspicious nodularities or skin changes. No evidence of local recurrence. Axillae are benign bilaterally, with no palpable lymphadenopathy. LUNGS:  Clear to auscultation on the left. Fair excursion. On the right, coarse breath sounds in the bases, with diffuse wheezing with inspiration.  HEART:  Regular rate and rhythm. No murmur  ABDOMEN:  Soft, nontender.  Positive bowel sounds.  MSK:  No focal spinal tenderness to palpation. Good range of motion bilaterally in the upper extremities. EXTREMITIES:  No peripheral edema. No lymphedema noted in the left upper extremity.   SKIN:  No visible rashes. No excessive ecchymoses. No petechiae. No pallor. NEURO:  Nonfocal. Well oriented.  Appropriate  affect.     LAB RESULTS: Lab Results  Component Value Date   WBC 6.0 03/30/2013   NEUTROABS 3.2 03/30/2013   HGB 12.9  03/30/2013   HCT 38.4 03/30/2013   MCV 89.5 03/30/2013   PLT 230 03/30/2013      Chemistry      Component Value Date/Time   NA 143 03/30/2013 1613   NA 138 02/15/2013 1441   K 3.8 03/30/2013 1613   K 3.4* 02/15/2013 1441   CL 100 02/15/2013 1441   CL 102 09/30/2012 1610   CO2 26 03/30/2013 1613   CO2 28 02/15/2013 1441   BUN 14.5 03/30/2013 1613   BUN 14 02/15/2013 1441   CREATININE 0.9 03/30/2013 1613   CREATININE 0.82 02/15/2013 1441      Component Value Date/Time   CALCIUM 9.1 03/30/2013 1613   CALCIUM 8.7 02/15/2013 1441   ALKPHOS 111 03/30/2013 1613   ALKPHOS 137* 08/31/2011 1201   AST 21 03/30/2013 1613   AST 23 08/31/2011 1201   ALT 14 03/30/2013 1613   ALT 18 08/31/2011 1201   BILITOT 0.23 03/30/2013 1613   BILITOT 0.2* 08/31/2011 1201       STUDIES:  Mm Digital Diagnostic Bilat 10/05/2013   CLINICAL DATA:  History of left lumpectomy for breast cancer 2011. Asymptomatic.  EXAM: DIGITAL DIAGNOSTIC  bilateral MAMMOGRAM WITH CAD  COMPARISON:  Prior exams  ACR Breast Density Category d: The breast tissue is extremely dense, which lowers the sensitivity of mammography.  FINDINGS: Left lumpectomy changes are noted. No suspicious finding is seen in either breast.  Mammographic images were processed with CAD.  IMPRESSION: No evidence for malignancy in either breast.  RECOMMENDATION: Diagnostic mammogram is suggested in 1 year. (Code:DM-B-01Y)  I have discussed the findings and recommendations with the patient. Results were also provided in writing at the conclusion of the visit. If applicable, a reminder letter will be sent to the patient regarding the next appointment.  BI-RADS CATEGORY  2: Benign.   Electronically Signed   By: Conchita Paris M.D.   On: 10/05/2013 11:50     ASSESSMENT: 63 y.o.  [name] woman   (1)  status post left lumpectomy and  sentinel lymph node dissection in April 2011 for a T1c N0, stage IA invasive ductal carcinoma, grade 1,strongly estrogen and progesterone receptor positive, HER2 negative with a borderline proliferation fraction and normal preoperative CA27.29.    (2)  She completed radiation therapy July 2011   (3)  started tamoxifen in July 2011, the original plan being to continue tamoxifen for total of 10 years  PLAN:  With regards to her breast cancer, [name] appears to be doing quite well. She does tolerate the tamoxifen well, and is definitely getting good relief from her hot flashes with the gabapentin. She needs refills on both of these medications.   As noted above, the plan is to continue tamoxifen for total of 10 years. [name] is considering stopping the tamoxifen after 5 years, and is previously noted by Dr. Jana Hakim, the difference in risk between 5 years and 10 years is 3%. Next summer will make 5 years on tamoxifen, and she will discuss this again when she meets with Dr. Jana Hakim 1 year from now, in late May/early June of 2016. Just prior to that appointment, she will have her repeat mammogram as well.   Otherwise, [name] will continue following with her other physicians for general health concerns. We will see her again in one year, but she knows to call prior to that time with any changes or problems. The above plan was reviewed in detail, and she voices both her understanding and agreement with our  plan.  Catherine Burrow PA-C    10/16/2013

## 2013-10-16 NOTE — Telephone Encounter (Signed)
per pof to sch mamma-sch appt-gave pt copy of sch

## 2013-10-17 ENCOUNTER — Telehealth: Payer: Self-pay | Admitting: *Deleted

## 2013-10-17 LAB — VITAMIN D 25 HYDROXY (VIT D DEFICIENCY, FRACTURES): Vit D, 25-Hydroxy: 56 ng/mL (ref 30–89)

## 2013-10-17 NOTE — Telephone Encounter (Signed)
Called pt to inform her lab results which were all normal. Pt was pleased with results. I faxed a copy of labs to Dr. Garwin Brothers and Dr. Noberto Retort. Pt verbalized understanding. No further concerns. Message to be forwarded to [employer], PA-C.

## 2013-10-24 ENCOUNTER — Telehealth: Payer: Self-pay | Admitting: Internal Medicine

## 2013-10-24 NOTE — Telephone Encounter (Signed)
Per Joellen Jersey send message to her

## 2013-10-24 NOTE — Telephone Encounter (Signed)
Spoke with patient-she states that she is concerned that she has not gotten a bill from [facility] regarding her labs we ordered 04-2013; She received an EOB stating that the provider ordering the labs needs to give more information as to why the labs were ordered. Pt will fax the EOB to me at [phone] so I may see what she is speaking about.  Will hold message in my inbasket until I have the paper information in front of me.

## 2013-10-24 NOTE — Telephone Encounter (Signed)
LMTCB   If patient has an EOB then its not a bill for services rendered. She will need to wait for a bill to come if she even has one. We do not have access to billing from [facility] and that would need to be taken up with [facility] and her insurance company. I am not aware of what information her insurance needs. EOB's give explanation of benefits and explains to the patient what charges are being filed under her insurance.

## 2013-10-24 NOTE — Telephone Encounter (Signed)
Pt is returning call & can be reached at [phone].  Satira Anis

## 2013-10-24 NOTE — Telephone Encounter (Signed)
Spoke with the pt  She states that she received EOB from [place] from when we order labs back in Dec 2014  She states that we have to call [facility] and her ins and give them "more information" so this will be covered and she wont have to pay  I advised will look into this and call her back

## 2013-11-01 NOTE — Telephone Encounter (Signed)
Katie, have you received the documentation yet? Thanks.

## 2013-11-06 NOTE — Telephone Encounter (Signed)
I am working on this with the patients insurance company; pt aware that I will keep her posted.

## 2013-11-16 NOTE — Telephone Encounter (Signed)
I have given the information on this patient to Chantel who is working on this.

## 2013-11-28 NOTE — Telephone Encounter (Signed)
Please advise on status of this and if message can be closed. Thanks!

## 2013-11-30 NOTE — Telephone Encounter (Signed)
Chantel, please advise if this has been taken care? Thanks.

## 2013-12-07 ENCOUNTER — Other Ambulatory Visit: Payer: Self-pay | Admitting: *Deleted

## 2013-12-08 ENCOUNTER — Other Ambulatory Visit: Payer: Self-pay | Admitting: *Deleted

## 2013-12-08 ENCOUNTER — Telehealth: Payer: Self-pay | Admitting: *Deleted

## 2013-12-08 NOTE — Telephone Encounter (Signed)
This RN spoke with pt per call stating need for a refill on boric acid vaginal suppositories.  Jimya states she has discussed the above with AB/PA in the past and has obtained prescriptions when needed.  Note Emilyrose states she uses the above with macrodantin "

## 2013-12-28 ENCOUNTER — Telehealth: Payer: Self-pay | Admitting: *Deleted

## 2013-12-28 NOTE — Telephone Encounter (Signed)
Received vm call from pt asking for refill on her tamoxifen & gabapentin to be sent to mail order with [facility]. @ fax # [phone]. Left message to verify where to send scripts & asked her to call back tomorrow.

## 2013-12-29 ENCOUNTER — Other Ambulatory Visit: Payer: Self-pay | Admitting: *Deleted

## 2013-12-29 DIAGNOSIS — C50912 Malignant neoplasm of unspecified site of left female breast: Secondary | ICD-10-CM

## 2013-12-29 DIAGNOSIS — C50919 Malignant neoplasm of unspecified site of unspecified female breast: Secondary | ICD-10-CM

## 2013-12-29 MED ORDER — GABAPENTIN 300 MG PO CAPS: 300.0000 mg | ORAL_CAPSULE | Freq: Every day | ORAL | Status: DC

## 2013-12-29 MED ORDER — TAMOXIFEN CITRATE 20 MG PO TABS: 20.0000 mg | ORAL_TABLET | Freq: Every day | ORAL | Status: DC

## 2014-04-28 NOTE — Telephone Encounter (Signed)
none

## 2014-05-01 ENCOUNTER — Telehealth: Payer: Self-pay | Admitting: *Deleted

## 2014-05-01 ENCOUNTER — Other Ambulatory Visit: Payer: Self-pay | Admitting: Emergency Medicine

## 2014-05-01 NOTE — Telephone Encounter (Signed)
LVM for patient to call us if she is having any issues.  Happy to schedule an appointment

## 2014-10-05 ENCOUNTER — Other Ambulatory Visit: Payer: Self-pay | Admitting: Oncology

## 2014-10-05 DIAGNOSIS — Z853 Personal history of malignant neoplasm of breast: Secondary | ICD-10-CM

## 2014-10-08 ENCOUNTER — Encounter

## 2014-10-11 ENCOUNTER — Other Ambulatory Visit: Payer: Self-pay | Admitting: *Deleted

## 2014-10-11 DIAGNOSIS — C50919 Malignant neoplasm of unspecified site of unspecified female breast: Secondary | ICD-10-CM

## 2014-10-16 ENCOUNTER — Other Ambulatory Visit (HOSPITAL_BASED_OUTPATIENT_CLINIC_OR_DEPARTMENT_OTHER)

## 2014-10-16 DIAGNOSIS — C50919 Malignant neoplasm of unspecified site of unspecified female breast: Secondary | ICD-10-CM

## 2014-10-16 LAB — COMPREHENSIVE METABOLIC PANEL (CC13)
ALT: 22 U/L (ref 0–55)
AST: 29 U/L (ref 5–34)
Albumin: 3.7 g/dL (ref 3.5–5.0)
Alkaline Phosphatase: 118 U/L (ref 40–150)
Anion Gap: 10 mEq/L (ref 3–11)
BUN: 13 mg/dL (ref 7.0–26.0)
CALCIUM: 9 mg/dL (ref 8.4–10.4)
CHLORIDE: 104 meq/L (ref 98–109)
CO2: 28 mEq/L (ref 22–29)
CREATININE: 0.9 mg/dL (ref 0.6–1.1)
EGFR: 65 mL/min/{1.73_m2} — ABNORMAL LOW (ref >90)
Glucose: 91 mg/dl (ref 70–140)
Potassium: 3.8 mEq/L (ref 3.5–5.1)
Sodium: 143 mEq/L (ref 136–145)
Total Bilirubin: 0.44 mg/dL (ref 0.20–1.20)
Total Protein: 6.7 g/dL (ref 6.4–8.3)

## 2014-10-16 LAB — CBC WITH DIFFERENTIAL/PLATELET
BASO%: 0.8 % (ref 0.0–2.0)
BASOS ABS: 0 10*3/uL (ref 0.0–0.1)
EOS ABS: 0.3 10*3/uL (ref 0.0–0.5)
EOS%: 5 % (ref 0.0–7.0)
HEMATOCRIT: 39 % (ref 34.8–46.6)
HEMOGLOBIN: 12.7 g/dL (ref 11.6–15.9)
LYMPH%: 18.2 % (ref 14.0–49.7)
MCH: 27.8 pg (ref 25.1–34.0)
MCHC: 32.5 g/dL (ref 31.5–36.0)
MCV: 85.7 fL (ref 79.5–101.0)
MONO#: 0.5 10*3/uL (ref 0.1–0.9)
MONO%: 8.2 % (ref 0.0–14.0)
NEUT%: 67.8 % (ref 38.4–76.8)
NEUTROS ABS: 3.8 10*3/uL (ref 1.5–6.5)
PLATELETS: 292 10*3/uL (ref 145–400)
RBC: 4.56 10*6/uL (ref 3.70–5.45)
RDW: 13.1 % (ref 11.2–14.5)
WBC: 5.5 10*3/uL (ref 3.9–10.3)
lymph#: 1 10*3/uL (ref 0.9–3.3)

## 2014-10-17 ENCOUNTER — Ambulatory Visit
Admission: RE | Admit: 2014-10-17 | Discharge: 2014-10-17 | Disposition: A | Discharge status: Home / Self Care | Source: Ambulatory Visit | Attending: Oncology | Admitting: Oncology

## 2014-10-17 DIAGNOSIS — Z853 Personal history of malignant neoplasm of breast: Secondary | ICD-10-CM

## 2014-10-22 ENCOUNTER — Ambulatory Visit (HOSPITAL_BASED_OUTPATIENT_CLINIC_OR_DEPARTMENT_OTHER): Admitting: Oncology

## 2014-10-22 VITALS — BP 153/88 | HR 88 | Temp 97.8°F | Resp 18 | Ht 62.0 in | Wt 160.4 lb

## 2014-10-22 DIAGNOSIS — Z853 Personal history of malignant neoplasm of breast: Secondary | ICD-10-CM | POA: Diagnosis not present

## 2014-10-22 DIAGNOSIS — C50919 Malignant neoplasm of unspecified site of unspecified female breast: Secondary | ICD-10-CM

## 2014-10-22 MED ORDER — VALACYCLOVIR HCL 500 MG PO TABS: 500.0000 mg | ORAL_TABLET | Freq: Two times a day (BID) | ORAL | Status: DC

## 2014-10-22 MED ORDER — GABAPENTIN 300 MG PO CAPS: 300.0000 mg | ORAL_CAPSULE | Freq: Every day | ORAL | Status: DC

## 2014-10-22 NOTE — Progress Notes (Signed)
ID: [name]   DOB: 05-07-1951  MR#: [id number]  CSN#:[id number]   PCP:  Nena Polio, NP GYN:  Servando Salina, MD SU: Jackolyn Confer, MD OTHER MD: Keturah Barre, MD;  Jerrell Belfast, MD  CHIEF COMPLAINT:  Hx of Left  Breast Cancer   CURRENT TREATMENT: completing Tamoxifen   HISTORY OF PRESENT ILLNESS: From the original intake note:  [name] had routine screening mammography March 29th, 2011 at the [facility].  This showed a possible mass in the left breast.  She was recalled for additional studies on April 5th.  This study demonstrated a focal mass in the outer mid-portion of the left breast with minimal irregularity.  Ultrasound showed an ovoid hypoechoic mass measuring up to 8 mm.  Again, there was minimal irregularity noted but biopsy was suggested and performed the same day.  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  The proliferation marker was 19% and there was no evidence of Her-2 amplification by CISH with a ratio of 1.03.  With this information, the patient was referred to Dr. Zella Richer and bilateral breast MRIs were obtained on August 27, 2009.  This showed an 11 millimeter mass in the central portion of the left breast correlated with the known malignancy.  There were no other areas suspicious for cancer and on April 26th, 2011, the patient underwent left axillary sentinel lymph node mapping and sampling and left lumpectomy with the final pathology from that procedure ([id number]) showing a 1.3 cm Grade 1 invasive ductal carcinoma, with negative margins, and 0 of 4 sentinel lymph nodes involved.  Her subsequent history is as detailed below  INTERVAL HISTORY: [name] returns today for follow-up of her breast cancer. She continues on tamoxifen. She generally tolerates that well, although she still having problems with hot flashes. Gabapentin  helps with those.  REVIEW OF SYSTEMS: Otherwise [name] is under a lot of stress. She has had shingles 2, and the second time (in the same spot, left upper back and thigh) was more painful and longer lasting. There are significant problems with her job which she described. Her husband seems to me like he will need bilateral amputations. He is up at night several times with leg pain and this of course interrupts her sleep. Her granddaughter is getting married this weekend which is good news but on the other hand also a source of stress on the family.. In addition [name] has developed some plantar fasciitis which means she cannot exercise regularly. A detailed review of systems was otherwise stable   PAST MEDICAL HISTORY: Past Medical History  Diagnosis Date  . Breast cancer 03/24/2011  . Asthma   . Allergy history unknown   . Sinusitis   . PONV (postoperative nausea and vomiting)   . Shortness of breath     asthma  . Pneumonia     hx of  . Bronchitis   . GERD (gastroesophageal reflux disease)   . Headache(784.0)   . Dizziness   . Glaucoma   Significant for glaucoma, status post laser surgery bilaterally.  History of unilateral salpingo-oophorectomy.  She believes there was a left ovary that was removed.  History of trauma to the left knee with kneecap fracture, not requiring surgery, followed by [name].  History of anxiety disorder.  History of bilateral blepharoplasty.  History of septoplasty.  History of two tooth implants in the right maxilla.  Question of  possible osteopenia.  History of benign skin biopsies.    PAST SURGICAL HISTORY: Past Surgical History  Procedure Laterality Date  . Breast lumpectomy  08-2009    left   . Nose surgery  12-2006  . Oophorectomy  2001  . Eye surgery Bilateral     laser surgery - glaucoma  . Eyelid surgery Bilateral     for glaucoma  . Sinus endo w/fusion Bilateral 02/16/2013    Procedure: ENDOSCOPIC SINUS SURGERY WITH FUSION NAVIGATION;   Surgeon: Jerrell Belfast, MD;  Location: [facility] OR;  Service: ENT;  Laterality: Bilateral;    FAMILY HISTORY Family History  Problem Relation Age of Onset  . Breast cancer Mother   . Asthma Brother   . Emphysema Father   . Heart disease Father   The patient's father died at the age of 9 from emphysema.  He was a Building control surveyor.  The patient's mother is alive at age 17.  She had a myocardial infarction about 10 years ago. The patient has two brothers.  There is no history of breast or ovarian cancer in the family.  GYNECOLOGIC HISTORY:  (Updated 10/16/2013) She is GX P1.  First pregnancy to term at age 21.  She understands that increases the risk of breast cancer developing.  Last menstrual period was about 10 years ago and she took hormones for about five years.    SOCIAL HISTORY:  (Reviewed 10/16/2013) She works as a Quarry manager.  Her husband [name] is disabled secondary to [employer], diabetes, and peripheral vascular disease. He also has a history of kidney cancer. Their daughter, [name], graduated from college in nutrition. She was  married in 2011. The patient's husband also has 4 children from a prior marriage, and 9 grandchildren. The patient attends the [employer] in [place].     ADVANCED DIRECTIVES: In place  HEALTH MAINTENANCE:  (Updated 10/16/2013) History  Substance Use Topics  . Smoking status: Never Smoker   . Smokeless tobacco: Never Used  . Alcohol Use: Yes     Comment: rarely/socially     Colonoscopy:  Not on file  PAP: Not on file/Dr. Cousins  Bone density: July 2011/ mild osteopenia  Lipid panel: UTD/Dr. Noberto Retort ([place])   Allergies  Allergen Reactions  . Amoxicillin-Pot Clavulanate Nausea And Vomiting  . Doxycycline Nausea And Vomiting    Current Outpatient Prescriptions  Medication Sig Dispense Refill  . acetaminophen (TYLENOL) 500 MG tablet Take 1,000 mg by mouth every 6 (six) hours as needed for pain.    Marland Kitchen ALPRAZolam (XANAX) 0.25  MG tablet Take 0.25 mg by mouth at bedtime as needed.      Marland Kitchen aspirin 81 MG tablet Take 81 mg by mouth daily.      . beclomethasone (QVAR) 80 MCG/ACT inhaler Inhale 2 puffs into the lungs 2 (two) times daily.    . benzonatate (TESSALON) 200 MG capsule Take 1 capsule (200 mg total) by mouth every 8 (eight) hours as needed for cough. 30 capsule PRN  . brinzolamide (AZOPT) 1 % ophthalmic suspension Place 1 drop into both eyes 2 (two) times daily.      . cetirizine (ZYRTEC) 10 MG tablet Take 1 tablet (10 mg total) by mouth daily. 90 tablet 3  . cholecalciferol (VITAMIN D) 400 UNITS TABS Take 400 Units by mouth daily.     Marland Kitchen EPINEPHrine (EPIPEN 2-PAK) 0.3 mg/0.3 mL DEVI Inject 0.3 mLs (0.3 mg total) into the muscle once. 1 Device 3  . escitalopram (LEXAPRO) 20  MG tablet Take 20 mg by mouth daily.    Marland Kitchen estradiol (ESTRACE) 0.1 MG/GM vaginal cream Place 9.14 Applicatorfuls vaginally daily. 42.5 g 0  . fluconazole (DIFLUCAN) 150 MG tablet Take 1 tablet (150 mg total) by mouth daily. 3 tablet 0  . gabapentin (NEURONTIN) 300 MG capsule Take 1 capsule (300 mg total) by mouth at bedtime. 90 capsule 3  . HYDROcodone-acetaminophen (NORCO) 5-325 MG per tablet Take 1-2 tablets by mouth every 6 (six) hours as needed for pain. 30 tablet 0  . latanoprost (XALATAN) 0.005 % ophthalmic solution Place 1 drop into both eyes at bedtime.      . mometasone-formoterol (DULERA) 100-5 MCG/ACT AERO Inhale 2 puffs into the lungs 2 (two) times daily.    . montelukast (SINGULAIR) 10 MG tablet Take 1 tablet (10 mg total) by mouth at bedtime. 90 tablet 3  . omalizumab (XOLAIR) 150 MG injection Inject 150 mg into the skin every 28 (twenty-eight) days.    . tamoxifen (NOLVADEX) 20 MG tablet Take 1 tablet (20 mg total) by mouth daily. 90 tablet 3   No current facility-administered medications for this visit.    OBJECTIVE: middle aged white woman who appears stated age 36 Vitals:   10/22/14 1344  BP: 153/88  Pulse: 88  Temp:  97.8 F (36.6 C)  Resp: 18     Body mass index is 29.33 kg/(m^2).    ECOG FS: 1 Filed Weights   10/22/14 1344  Weight: 160 lb 6.4 oz (72.757 kg)   Sclerae unicteric, pupils round and equal Oropharynx clear and moist-- no thrush or other lesions No cervical or supraclavicular adenopathy Lungs no rales or rhonchi Heart regular rate and rhythm Abd soft, obese, nontender, positive bowel sounds MSK no focal spinal tenderness, no upper extremity lymphedema Neuro: nonfocal, well oriented, stressed affect Breasts: The right breast is unremarkable. The left breast is status post lumpectomy and radiation. There is no evidence of local recurrence. The right axilla is benign.   LAB RESULTS: Lab Results  Component Value Date   WBC 5.5 10/16/2014   NEUTROABS 3.8 10/16/2014   HGB 12.7 10/16/2014   HCT 39.0 10/16/2014   MCV 85.7 10/16/2014   PLT 292 10/16/2014      Chemistry      Component Value Date/Time   NA 143 10/16/2014 1221   NA 138 02/15/2013 1441   K 3.8 10/16/2014 1221   K 3.4* 02/15/2013 1441   CL 100 02/15/2013 1441   CL 102 09/30/2012 1610   CO2 28 10/16/2014 1221   CO2 28 02/15/2013 1441   BUN 13.0 10/16/2014 1221   BUN 14 02/15/2013 1441   CREATININE 0.9 10/16/2014 1221   CREATININE 0.82 02/15/2013 1441      Component Value Date/Time   CALCIUM 9.0 10/16/2014 1221   CALCIUM 8.7 02/15/2013 1441   ALKPHOS 118 10/16/2014 1221   ALKPHOS 137* 08/31/2011 1201   AST 29 10/16/2014 1221   AST 23 08/31/2011 1201   ALT 22 10/16/2014 1221   ALT 18 08/31/2011 1201   BILITOT 0.44 10/16/2014 1221   BILITOT 0.2* 08/31/2011 1201       STUDIES: Mm Diag Breast Tomo Bilateral  10/17/2014   CLINICAL DATA:  History of left breast cancer status post lumpectomy in 2011.  EXAM: DIGITAL DIAGNOSTIC BILATERAL MAMMOGRAM WITH 3D TOMOSYNTHESIS AND CAD  COMPARISON:  With priors.  ACR Breast Density Category b: There are scattered areas of fibroglandular density.  FINDINGS: Stable  lumpectomy changes are seen  in the left breast. There is no suspicious mass or malignant type microcalcifications in either breast.  Mammographic images were processed with CAD.  IMPRESSION: No evidence of malignancy in either breast.  RECOMMENDATION: Bilateral diagnostic mammogram in 1 year is recommended  I have discussed the findings and recommendations with the patient. Results were also provided in writing at the conclusion of the visit. If applicable, a reminder letter will be sent to the patient regarding the next appointment.  BI-RADS CATEGORY  2: Benign.   Electronically Signed   By: Lillia Mountain M.D.   On: 10/17/2014 13:47     ASSESSMENT: 64 y.o.  [name] woman   (1)  status post left lumpectomy and sentinel lymph node dissection in April 2011 for a T1c N0, stage IA invasive ductal carcinoma, grade 1,strongly estrogen and progesterone receptor positive, HER2 negative with a borderline proliferation fraction and normal preoperative CA27.29.    (2)  She completed radiation therapy July 2011   (3)  started tamoxifen in July 2011, completing 5 years June 2016  PLAN:  As far as breast cancer is concerned [name] is doing terrific. She is more than 5 years out from her definitive surgery with no evidence of disease recurrence. She will stop tamoxifen when she runs out of her current supply later this month.  In some patients who are at high risk of recurrence, for example node positive, we have data to continue tamoxifen or other anti-estrogens for another 5 years. In patients like [name] I don't think the benefit outweighs the possible side effects and I'm very comfortable stopping at this point.  Partly because of her age and partly because of the incredible amount of stress she is on, I think she does remain at risk for further episodes of zoster. She would like to do "everything possible" to prevent that. She is thinking of receiving the zoster vaccine and I think is very sensible. In  addition she might consider suppressive therapy. The lowest dose that I think might have a chance to accomplish that would be 500 mg of bowel tracks daily, and I went ahead and wrote that for her.  She is also interested in continuing gabapentin, at least for now. I was glad to refill that for her, but further refills would have to be through her primary care physician.  At this point I am comfortable releasing [name] to her primary care physician. All she will need in terms of breast cancer follow-up is a yearly breast exam by her physician and yearly mammography. Of course I will be glad to see [name] again at any point in the future if and when the need arises, but as of now were making no further routine appointments for her here.    Chauncey Cruel, MD     10/22/2014

## 2015-01-25 ENCOUNTER — Other Ambulatory Visit: Payer: Self-pay | Admitting: *Deleted

## 2015-01-25 MED ORDER — OMALIZUMAB 150 MG ~~LOC~~ SOLR
300.0000 mg | SUBCUTANEOUS | Status: DC
  Administered 2015-02-21 – 2016-12-01 (×18): 300 mg via SUBCUTANEOUS

## 2015-02-21 ENCOUNTER — Ambulatory Visit (INDEPENDENT_AMBULATORY_CARE_PROVIDER_SITE_OTHER)

## 2015-02-21 DIAGNOSIS — J4540 Moderate persistent asthma, uncomplicated: Secondary | ICD-10-CM | POA: Diagnosis not present

## 2015-03-26 ENCOUNTER — Ambulatory Visit (INDEPENDENT_AMBULATORY_CARE_PROVIDER_SITE_OTHER): Admitting: *Deleted

## 2015-03-26 DIAGNOSIS — J4540 Moderate persistent asthma, uncomplicated: Secondary | ICD-10-CM

## 2015-03-26 MED ORDER — EPINEPHRINE 0.3 MG/0.3ML IJ SOAJ: 0.3000 mg | Freq: Once | INTRAMUSCULAR | Status: DC

## 2015-03-26 MED ORDER — MONTELUKAST SODIUM 10 MG PO TABS: 10.0000 mg | ORAL_TABLET | Freq: Every day | ORAL | Status: DC

## 2015-04-22 ENCOUNTER — Other Ambulatory Visit: Payer: Self-pay

## 2015-04-22 MED ORDER — DEXLANSOPRAZOLE 60 MG PO CPDR: 60.0000 mg | DELAYED_RELEASE_CAPSULE | Freq: Every day | ORAL | Status: DC

## 2015-04-22 NOTE — Telephone Encounter (Signed)
Patient is out of refills for Dexlan 60MG  Caps. She uses [facility] mail in order for her pharmacy. DOL Visits 01/15/2015 [place]

## 2015-04-22 NOTE — Telephone Encounter (Addendum)
Sent in RX to patients pharmacy and patient notified.

## 2015-04-24 ENCOUNTER — Other Ambulatory Visit: Payer: Self-pay | Admitting: Allergy and Immunology

## 2015-04-24 NOTE — Telephone Encounter (Signed)
Pt called requesting RX for Diflucan. She is getting yeast in her mouth again. pls advise  [facility] [address]

## 2015-04-25 ENCOUNTER — Ambulatory Visit (INDEPENDENT_AMBULATORY_CARE_PROVIDER_SITE_OTHER)

## 2015-04-25 DIAGNOSIS — J4540 Moderate persistent asthma, uncomplicated: Secondary | ICD-10-CM | POA: Diagnosis not present

## 2015-04-25 MED ORDER — FLUCONAZOLE 150 MG PO TABS: 150.0000 mg | ORAL_TABLET | Freq: Every day | ORAL | Status: DC

## 2015-04-25 NOTE — Telephone Encounter (Signed)
Please provide Diflucan 150 one tablet today

## 2015-04-25 NOTE — Telephone Encounter (Signed)
Spoke to patient and notified of RX being sent to pharmacy.

## 2015-04-25 NOTE — Telephone Encounter (Signed)
Medication sent to pharmacy and left voicemail for patient to return phone call.

## 2015-04-29 ENCOUNTER — Telehealth: Payer: Self-pay | Admitting: *Deleted

## 2015-04-30 ENCOUNTER — Other Ambulatory Visit: Payer: Self-pay

## 2015-04-30 MED ORDER — FLUCONAZOLE 150 MG PO TABS: ORAL_TABLET | ORAL | Status: DC

## 2015-04-30 NOTE — Telephone Encounter (Signed)
Please inform patient that we can send in Diflucan 150 Q Week for four weeks which should take care of her problem.

## 2015-04-30 NOTE — Telephone Encounter (Signed)
Rx for Diflucan 150 mg once weekly x 4 weeks sent to pharmacy.  Tried to call patient to advise.  No answer.  Left message on work voicemail to return my call in [place] office today or [place] on Wednesday.

## 2015-05-01 NOTE — Telephone Encounter (Signed)
Patient was notified and [facility]

## 2015-05-01 NOTE — Telephone Encounter (Signed)
Tried to call patient.  No answer.  Left message on voicemail at work.  Patient needs to be advised that Diflucan prescription to her pharmacy.

## 2015-05-01 NOTE — Telephone Encounter (Signed)
Patient advised and prescription was picked up last night.

## 2015-05-03 ENCOUNTER — Telehealth: Payer: Self-pay | Admitting: Allergy and Immunology

## 2015-05-03 MED ORDER — PREDNISONE 10 MG PO TABS: ORAL_TABLET | ORAL | Status: DC

## 2015-05-03 NOTE — Telephone Encounter (Signed)
She is having congestion and yellow drainage. She says she feels like her head is in a barrell. She is flying to [place] this weekend. Is there anyway that she can get something for this or a prescription for prednisone?  [facility] [address] [facility] [place].

## 2015-05-03 NOTE — Telephone Encounter (Signed)
Please give patient prednisone 10 mg one tablet one time per day for 5 days

## 2015-05-03 NOTE — Telephone Encounter (Signed)
Patient called back advised prednisone sent to pharmacy

## 2015-05-03 NOTE — Telephone Encounter (Signed)
Prednisone sent in to pharmacy. Attempted to call patient at cell provided not valid, at home husband picked up and he advised writer to call her at work. Patient not available. If patient calls back needs to be advised rx sent in to [place].

## 2015-05-07 NOTE — Telephone Encounter (Signed)
05/07/2015 3:52 PM- Tried to call patient.  No answer.  Left message on patient's voicemail at work.

## 2015-05-14 ENCOUNTER — Telehealth: Payer: Self-pay | Admitting: Allergy and Immunology

## 2015-05-14 NOTE — Telephone Encounter (Signed)
I have never seen this patient and her last appt was 4 months ago with Dr. Neldon Mc. Is there any way she can come in for an appointment today or tomorrow?

## 2015-05-14 NOTE — Telephone Encounter (Signed)
Pt called and said that she sick with sinus infection and would like for [place] to cal in something. She has pressure around her eyes. Coughing and got drainage and it is dark yellow almost green. [phone]. [facility] [address] in [place].

## 2015-05-14 NOTE — Telephone Encounter (Signed)
Spoke with patient today at 2:18 PM and she did pick up the Diflucan.

## 2015-05-15 ENCOUNTER — Ambulatory Visit (INDEPENDENT_AMBULATORY_CARE_PROVIDER_SITE_OTHER): Admitting: Allergy and Immunology

## 2015-05-15 ENCOUNTER — Encounter: Payer: Self-pay | Admitting: Allergy and Immunology

## 2015-05-15 VITALS — BP 124/66 | HR 92 | Temp 98.0°F | Resp 16

## 2015-05-15 DIAGNOSIS — B373 Candidiasis of vulva and vagina: Secondary | ICD-10-CM | POA: Insufficient documentation

## 2015-05-15 DIAGNOSIS — J0100 Acute maxillary sinusitis, unspecified: Secondary | ICD-10-CM | POA: Insufficient documentation

## 2015-05-15 DIAGNOSIS — B3731 Acute candidiasis of vulva and vagina: Secondary | ICD-10-CM | POA: Insufficient documentation

## 2015-05-15 DIAGNOSIS — J45901 Unspecified asthma with (acute) exacerbation: Secondary | ICD-10-CM | POA: Diagnosis not present

## 2015-05-15 DIAGNOSIS — J019 Acute sinusitis, unspecified: Secondary | ICD-10-CM | POA: Insufficient documentation

## 2015-05-15 MED ORDER — METHYLPREDNISOLONE ACETATE 80 MG/ML IJ SUSP
80.0000 mg | Freq: Once | INTRAMUSCULAR | Status: AC
  Administered 2015-05-15: 80 mg via INTRAMUSCULAR

## 2015-05-15 MED ORDER — FLUCONAZOLE 150 MG PO TABS
ORAL_TABLET | ORAL | Status: DC
Start: 2015-05-15 — End: 2016-02-12

## 2015-05-15 MED ORDER — AZITHROMYCIN 250 MG PO TABS: ORAL_TABLET | ORAL | Status: DC

## 2015-05-15 MED ORDER — PREDNISONE 1 MG PO TABS: 10.0000 mg | ORAL_TABLET | ORAL | Status: DC

## 2015-05-15 NOTE — Assessment & Plan Note (Addendum)
   A prescription has been provided for fluconazole 150 mg by mouth today and again in 3 days.

## 2015-05-15 NOTE — Assessment & Plan Note (Addendum)
   Depo-Medrol 80 mg was administered in the office.  Prednisone has been provided and is to be started tomorrow as follows: 20 mg daily x 4 days, 10 mg x1 day, then stop.  A prescription has been provided for azithromycin, 500 mg on day 1 and 250 mg on days 2 through 5.  Continue Qnasl 80 g one actuation per nostril twice daily.  Nasal saline lavage (NeilMed) as needed has been recommended along with instructions for proper administration.Guaifenesin 1200 mg twice daily as needed with adequate hydration as discussed.   The patient has been asked to contact me if her symptoms persist or progress. Otherwise, she may return for follow up in 4 months.

## 2015-05-15 NOTE — Assessment & Plan Note (Signed)
   Systemic steroid has been provided (as above).  During respiratory tract infections and asthma flares, the patient may add Dulera 100/5 g, 2 inhalations via spacer device twice a day until symptoms have returned to baseline.  Continue Xolair injections as prescribed montelukast 10 mg albuterol every 4-6 hours as needed.

## 2015-05-15 NOTE — Progress Notes (Signed)
RE: [name] MRN: [id number] DOB: Oct 04, 1950 [facility] [place] [address] [place] [address] Date of Office Visit: 05/15/2015  Referring provider: Nena Polio, NP [address] [place], [place] [address]  History of present illness: HPI Comments: [name] is a 64 y.o. female with persistent asthma on Xolair therapy and allergic rhinitis who presents today for sick visit.  She was previously seen in this office in August 2016.  She complains of sinus pressure/pain, nasal congestion, postnasal drainage, fevers, and discolored mucus production.  The symptoms have been progressing over the past 8 days.  She also complains of coughing, dyspnea, and wheezing.  She has requested a prescription for an antifungal if an antibiotic as prescribed.   Assessment and plan: Acute sinusitis  Depo-Medrol 80 mg was administered in the office.  Prednisone has been provided and is to be started tomorrow as follows: 20 mg daily x 4 days, 10 mg x1 day, then stop.  A prescription has been provided for azithromycin, 500 mg on day 1 and 250 mg on days 2 through 5.  Continue Qnasl 80 g one actuation per nostril twice daily.  Nasal saline lavage (NeilMed) as needed has been recommended along with instructions for proper administration.Guaifenesin 1200 mg twice daily as needed with adequate hydration as discussed.   The patient has been asked to contact me if her symptoms persist or progress. Otherwise, she may return for follow up in 4 months.  Asthma with acute exacerbation  Systemic steroid has been provided (as above).  During respiratory tract infections and asthma flares, the patient may add Dulera 100/5 g, 2 inhalations via spacer device twice a day until symptoms have returned to baseline.  Continue Xolair injections as prescribed montelukast 10 mg albuterol every 4-6 hours as needed.   Vaginal candidiasis  A  prescription has been provided for fluconazole 150 mg by mouth today and again in 3 days.    Medications ordered this encounter: Meds ordered this encounter  Medications  . methylPREDNISolone acetate (DEPO-MEDROL) injection 80 mg    Sig:   . fluconazole (DIFLUCAN) 150 MG tablet    Sig: Take One Tablet Today then Repeat in 3 days    Dispense:  2 tablet    Refill:  0  . azithromycin (ZITHROMAX Z-PAK) 250 MG tablet    Sig: Take 2 tablets by mouth on day 1 and 1 tablet by mouth on days 2 through 5.    Dispense:  6 each    Refill:  0  . predniSONE (DELTASONE) tablet 10 mg    Sig:     Diagnositics: Spirometry reveals FVC of 2.14 L and an FEV1 of 1.95 L (96% predicted).    Physical examination: Blood pressure 124/66, pulse 92, temperature 98 F (36.7 C), temperature source Oral, resp. rate 16.  General: Alert, interactive, in no acute distress. HEENT: TMs pearly gray, turbinates edematous with thick discharge, post-pharynx erythematous. Neck: Supple without lymphadenopathy. Lungs: Clear to auscultation without wheezing, rhonchi or rales. CV: Normal S1, S2 without murmurs. Skin: Warm and dry, without lesions or rashes.  The following portions of the patient's history were reviewed and updated as appropriate: allergies, current medications, past family history, past medical history, past social history, past surgical history and problem list.  Outpatient medications:   Medication List       This list is accurate as of: 05/15/15  6:28 PM.  Always use your most recent  med list.               acetaminophen 500 MG tablet  Commonly known as:  TYLENOL  Take 1,000 mg by mouth every 6 (six) hours as needed for pain. Reported on 05/15/2015     ALPRAZolam 0.25 MG tablet  Commonly known as:  XANAX  Take 0.25 mg by mouth at bedtime as needed. Reported on 05/15/2015     azithromycin 250 MG tablet  Commonly known as:  ZITHROMAX Z-PAK  Take 2 tablets by mouth on day 1 and 1 tablet  by mouth on days 2 through 5.     beclomethasone 80 MCG/ACT inhaler  Commonly known as:  QVAR  Inhale 2 puffs into the lungs 2 (two) times daily. Reported on 05/15/2015     brinzolamide 1 % ophthalmic suspension  Commonly known as:  AZOPT  Place 1 drop into both eyes 2 (two) times daily.     cetirizine 10 MG tablet  Commonly known as:  ZYRTEC  Take 1 tablet (10 mg total) by mouth daily.     cholecalciferol 400 units Tabs tablet  Commonly known as:  VITAMIN D  Take 400 Units by mouth daily. Reported on 05/15/2015     dexlansoprazole 60 MG capsule  Commonly known as:  DEXILANT  Take 1 capsule (60 mg total) by mouth daily.     EPINEPHrine 0.3 mg/0.3 mL Soaj injection  Commonly known as:  EPIPEN 2-PAK  Inject 0.3 mLs (0.3 mg total) into the muscle once.     escitalopram 20 MG tablet  Commonly known as:  LEXAPRO  Take 20 mg by mouth daily.     fluconazole 150 MG tablet  Commonly known as:  DIFLUCAN  Take One Tablet Today then Repeat in 3 days     gabapentin 300 MG capsule  Commonly known as:  NEURONTIN  Take 1 capsule (300 mg total) by mouth at bedtime.     latanoprost 0.005 % ophthalmic solution  Commonly known as:  XALATAN  Place 1 drop into both eyes at bedtime.     mometasone-formoterol 100-5 MCG/ACT Aero  Commonly known as:  DULERA  Inhale 2 puffs into the lungs 2 (two) times daily.     montelukast 10 MG tablet  Commonly known as:  SINGULAIR  Take 1 tablet (10 mg total) by mouth at bedtime.     QNASL 80 MCG/ACT Aers  Generic drug:  Beclomethasone Dipropionate  Place 1 spray into the nose daily. Reported on 05/15/2015     ranitidine 300 MG tablet  Commonly known as:  ZANTAC  Take 300 mg by mouth at bedtime.     valACYclovir 500 MG tablet  Commonly known as:  VALTREX  Take 1 tablet (500 mg total) by mouth 2 (two) times daily.        Known medication allergies: Allergies  Allergen Reactions  . Amoxicillin-Pot Clavulanate Nausea And Vomiting  .  Doxycycline Nausea And Vomiting  . Nitrofurantoin Macrocrystal Other (See Comments)    Edema  . Pregabalin Other (See Comments)    Blurry vision   Review of systems: Constitutional: Negative for weight loss.  Positive for fever.  HENT: Negative for nosebleeds.  Positive for sinus pressure, nasal congestion, postnasal drainage.   Eyes: Negative for blurred vision.  Respiratory: Negative for hemoptysis.   Positive for coughing, dyspnea, wheezing. Cardiovascular: Negative for chest pain.  Gastrointestinal: Negative for diarrhea and constipation.  Genitourinary: Negative for dysuria.  Musculoskeletal: Negative for myalgias and joint  pain.  Neurological: Negative for dizziness.  Endo/Heme/Allergies: Does not bruise/bleed easily.   Past Medical History  Diagnosis Date  . Breast cancer ([place]) 03/24/2011  . Asthma   . Allergy history unknown   . Sinusitis   . PONV (postoperative nausea and vomiting)   . Shortness of breath     asthma  . Pneumonia     hx of  . Bronchitis   . GERD (gastroesophageal reflux disease)   . Headache(784.0)   . Dizziness   . Glaucoma     Family History  Problem Relation Age of Onset  . Breast cancer Mother   . Asthma Brother   . Emphysema Father   . Heart disease Father     Social History   Social History  . Marital Status: Married    Spouse Name: N/A  . Number of Children: 1  . Years of Education: N/A   Occupational History  . [employer]   Social History Main Topics  . Smoking status: Never Smoker   . Smokeless tobacco: Never Used  . Alcohol Use: Yes     Comment: rarely/socially  . Drug Use: No  . Sexual Activity: Yes    Birth Control/ Protection: Post-menopausal   Other Topics Concern  . Not on file   Social History Narrative    I appreciate the opportunity to take part in this Rowena's care. Please do not hesitate to contact me with questions.  Sincerely,   R. Edgar Frisk, MD

## 2015-05-15 NOTE — Patient Instructions (Addendum)
Acute sinusitis  Depo-Medrol 80 mg was administered in the office.  Prednisone has been provided and is to be started tomorrow as follows: 20 mg daily x 4 days, 10 mg x1 day, then stop.  A prescription has been provided for azithromycin, 500 mg on day 1 and 250 mg on days 2 through 5.  Continue Qnasl 80 g one actuation per nostril twice daily.  Nasal saline lavage (NeilMed) as needed has been recommended along with instructions for proper administration.Guaifenesin 1200 mg twice daily as needed with adequate hydration as discussed.   The patient has been asked to contact me if her symptoms persist or progress. Otherwise, she may return for follow up in 4 months.  Asthma with acute exacerbation  Systemic steroid has been provided (as above).  During respiratory tract infections and asthma flares, the patient may add Dulera 100/5 g, 2 inhalations via spacer device twice a day until symptoms have returned to baseline.  Continue Xolair injections as prescribed montelukast 10 mg albuterol every 4-6 hours as needed.   Vaginal candidiasis  A prescription has been provided for fluconazole 150 mg by mouth today and again in 3 days.    Return in about 4 months (around 09/13/2015), or if symptoms worsen or fail to improve.

## 2015-05-23 ENCOUNTER — Ambulatory Visit (INDEPENDENT_AMBULATORY_CARE_PROVIDER_SITE_OTHER)

## 2015-05-23 DIAGNOSIS — J4540 Moderate persistent asthma, uncomplicated: Secondary | ICD-10-CM

## 2015-06-25 ENCOUNTER — Ambulatory Visit (INDEPENDENT_AMBULATORY_CARE_PROVIDER_SITE_OTHER): Admitting: *Deleted

## 2015-06-25 DIAGNOSIS — J4540 Moderate persistent asthma, uncomplicated: Secondary | ICD-10-CM

## 2015-07-24 ENCOUNTER — Ambulatory Visit (INDEPENDENT_AMBULATORY_CARE_PROVIDER_SITE_OTHER)

## 2015-07-24 ENCOUNTER — Other Ambulatory Visit: Payer: Self-pay

## 2015-07-24 ENCOUNTER — Telehealth: Payer: Self-pay

## 2015-07-24 DIAGNOSIS — J4540 Moderate persistent asthma, uncomplicated: Secondary | ICD-10-CM | POA: Diagnosis not present

## 2015-07-24 MED ORDER — MONTELUKAST SODIUM 10 MG PO TABS: 10.0000 mg | ORAL_TABLET | Freq: Every day | ORAL | Status: DC

## 2015-07-24 MED ORDER — DEXLANSOPRAZOLE 60 MG PO CPDR: 60.0000 mg | DELAYED_RELEASE_CAPSULE | Freq: Every day | ORAL | Status: DC

## 2015-07-24 NOTE — Telephone Encounter (Signed)
Printed out 90 day supply rx's they are on dr [employer] desk

## 2015-07-24 NOTE — Telephone Encounter (Signed)
Requesting 90 day rx of Singulair and Dexilant.  She uses the [place].

## 2015-07-30 NOTE — Telephone Encounter (Signed)
Left message informing her scripts are ready for pick up.

## 2015-08-20 ENCOUNTER — Ambulatory Visit (INDEPENDENT_AMBULATORY_CARE_PROVIDER_SITE_OTHER)

## 2015-08-20 DIAGNOSIS — J4540 Moderate persistent asthma, uncomplicated: Secondary | ICD-10-CM | POA: Diagnosis not present

## 2015-08-27 ENCOUNTER — Other Ambulatory Visit: Payer: Self-pay

## 2015-08-27 ENCOUNTER — Other Ambulatory Visit: Payer: Self-pay | Admitting: Obstetrics and Gynecology

## 2015-08-27 DIAGNOSIS — Z9889 Other specified postprocedural states: Secondary | ICD-10-CM

## 2015-08-27 DIAGNOSIS — Z853 Personal history of malignant neoplasm of breast: Secondary | ICD-10-CM

## 2015-08-29 ENCOUNTER — Other Ambulatory Visit: Payer: Self-pay | Admitting: Oncology

## 2015-08-29 DIAGNOSIS — Z853 Personal history of malignant neoplasm of breast: Secondary | ICD-10-CM

## 2015-08-29 DIAGNOSIS — Z9889 Other specified postprocedural states: Secondary | ICD-10-CM

## 2015-09-17 ENCOUNTER — Ambulatory Visit (INDEPENDENT_AMBULATORY_CARE_PROVIDER_SITE_OTHER)

## 2015-09-17 DIAGNOSIS — J4540 Moderate persistent asthma, uncomplicated: Secondary | ICD-10-CM | POA: Diagnosis not present

## 2015-10-21 ENCOUNTER — Other Ambulatory Visit: Payer: Self-pay | Admitting: *Deleted

## 2015-10-21 MED ORDER — OMALIZUMAB 150 MG ~~LOC~~ SOLR: 300.0000 mg | SUBCUTANEOUS | Status: DC

## 2015-10-23 ENCOUNTER — Ambulatory Visit
Admission: RE | Admit: 2015-10-23 | Discharge: 2015-10-23 | Disposition: A | Discharge status: Home / Self Care | Source: Ambulatory Visit | Attending: Obstetrics and Gynecology | Admitting: Obstetrics and Gynecology

## 2015-10-23 DIAGNOSIS — Z853 Personal history of malignant neoplasm of breast: Secondary | ICD-10-CM

## 2015-10-23 DIAGNOSIS — Z9889 Other specified postprocedural states: Secondary | ICD-10-CM

## 2015-11-07 ENCOUNTER — Ambulatory Visit (INDEPENDENT_AMBULATORY_CARE_PROVIDER_SITE_OTHER)

## 2015-11-07 DIAGNOSIS — J4540 Moderate persistent asthma, uncomplicated: Secondary | ICD-10-CM | POA: Diagnosis not present

## 2015-12-04 ENCOUNTER — Ambulatory Visit (INDEPENDENT_AMBULATORY_CARE_PROVIDER_SITE_OTHER)

## 2015-12-04 DIAGNOSIS — J4540 Moderate persistent asthma, uncomplicated: Secondary | ICD-10-CM

## 2016-01-01 ENCOUNTER — Ambulatory Visit (INDEPENDENT_AMBULATORY_CARE_PROVIDER_SITE_OTHER): Admitting: *Deleted

## 2016-01-01 DIAGNOSIS — J4540 Moderate persistent asthma, uncomplicated: Secondary | ICD-10-CM

## 2016-01-29 ENCOUNTER — Ambulatory Visit

## 2016-02-07 ENCOUNTER — Telehealth: Payer: Self-pay | Admitting: Allergy and Immunology

## 2016-02-07 ENCOUNTER — Other Ambulatory Visit: Payer: Self-pay

## 2016-02-07 MED ORDER — PREDNISONE 10 MG PO TABS: ORAL_TABLET | ORAL | 0 refills | Status: DC

## 2016-02-07 MED ORDER — AZITHROMYCIN 250 MG PO TABS: ORAL_TABLET | ORAL | 0 refills | Status: DC

## 2016-02-07 NOTE — Telephone Encounter (Signed)
Pt said she has a a lot of pressure and a headache that started Monday. She is doing nasal saline and taking her montelukast. Pt is having some throat drainage that she is coughing up that is green in color. She believes it is in her chest now. Pt is running a fever of around 100F.Marland Kitchen Pt is having chills with body aches.

## 2016-02-07 NOTE — Telephone Encounter (Signed)
Sent in meds and informed pt of me doing so,

## 2016-02-07 NOTE — Telephone Encounter (Signed)
Reviewed chart. She had a sinus infection at her last visit in 2016.   If she is having any respiratory symptoms (cough, wheeze, SOB) it was recommended she add her Dulera 2 puffs twice a day until symptoms have returned to baseline.  Use albuterol as needed.   It sounds like she is having another sinusitis given pressure, Ha, colored drainage and mild elev temp.   Please send in azithromycin 500 mg on day 1 and 250 mg on days 2 through 5. Also send Prednisone 20 mg daily x 4 days, 10 mg x1 day, then stop. She should continue her nasal saline rinse and Qnasl as well as her singulair.   May use Mucinex 1200 mg twice daily as needed with plenty of water  If she is still having issues next week she needs to be seen.

## 2016-02-07 NOTE — Telephone Encounter (Signed)
Patient called and says she thinks she has a sinus infection and wants to know if you can call her in prednisone or something.

## 2016-02-10 ENCOUNTER — Telehealth: Payer: Self-pay | Admitting: Allergy & Immunology

## 2016-02-10 NOTE — Telephone Encounter (Signed)
Pt came by and wanted to see if she could get more prednisone for her condition. She that Dr. Neldon Mc always gives her more.[phone] [facility] [address]

## 2016-02-11 NOTE — Telephone Encounter (Signed)
If this pt is not improved with the antibiotic and steroid regimen prescribed last week and is requesting additional prednisone would prefer she be seen by available provider if she is able to schedule an appointment to ensure she does not need extended antibiotic or any other treatment.  If not would recommend she see her PCP in the interim.

## 2016-02-11 NOTE — Telephone Encounter (Signed)
Spoke with pt and she is willing to be seen this week. Dr Raliegh Ip has a open spot tomorrow at 115.

## 2016-02-12 ENCOUNTER — Encounter: Payer: Self-pay | Admitting: Allergy and Immunology

## 2016-02-12 ENCOUNTER — Ambulatory Visit (INDEPENDENT_AMBULATORY_CARE_PROVIDER_SITE_OTHER): Admitting: Allergy and Immunology

## 2016-02-12 ENCOUNTER — Encounter (INDEPENDENT_AMBULATORY_CARE_PROVIDER_SITE_OTHER): Payer: Self-pay

## 2016-02-12 VITALS — BP 130/90 | HR 80 | Temp 98.4°F | Resp 16

## 2016-02-12 DIAGNOSIS — J0100 Acute maxillary sinusitis, unspecified: Secondary | ICD-10-CM | POA: Diagnosis not present

## 2016-02-12 DIAGNOSIS — J3089 Other allergic rhinitis: Secondary | ICD-10-CM | POA: Diagnosis not present

## 2016-02-12 DIAGNOSIS — J4540 Moderate persistent asthma, uncomplicated: Secondary | ICD-10-CM | POA: Diagnosis not present

## 2016-02-12 DIAGNOSIS — J387 Other diseases of larynx: Secondary | ICD-10-CM | POA: Diagnosis not present

## 2016-02-12 DIAGNOSIS — K219 Gastro-esophageal reflux disease without esophagitis: Secondary | ICD-10-CM

## 2016-02-12 MED ORDER — METHYLPREDNISOLONE ACETATE 80 MG/ML IJ SUSP
80.0000 mg | Freq: Once | INTRAMUSCULAR | Status: AC
  Administered 2016-02-12: 80 mg via INTRAMUSCULAR

## 2016-02-12 MED ORDER — MOMETASONE FURO-FORMOTEROL FUM 200-5 MCG/ACT IN AERO: 2.0000 | INHALATION_SPRAY | Freq: Two times a day (BID) | RESPIRATORY_TRACT | 5 refills | Status: DC

## 2016-02-12 MED ORDER — LEVOFLOXACIN 500 MG PO TABS: 500.0000 mg | ORAL_TABLET | Freq: Every day | ORAL | 0 refills | Status: DC

## 2016-02-12 NOTE — Progress Notes (Signed)
Follow-up Note  Referring Provider: Nena Polio, NP Primary Provider: Nena Polio, NP Date of Office Visit: 02/12/2016  Subjective:   [name] (DOB: Dec 06, 1950) is a 65 y.o. female who returns to the [facility] on 02/12/2016 in re-evaluation of the following:  HPI: [name] returns to this clinic in reevaluation of her asthma treated with Xolair, allergic rhinitis and history of chronic sinusitis requiring sinus surgery, reflux-induced respiratory disease, and a recent sinus infection. I've not seen her in his clinic in approximately one year.  She has been doing just wonderful with her atopic respiratory disease while consistently using Xolair and has basically tapered off all her controller agent and rarely uses a short acting bronchodilator and has not required a systemic steroid to treat an exacerbation. Likewise, her upper airways have really been doing quite well while consistently using Qnasl and montelukast and she has not required an antibiotic to treat an episode of sinusitis. And her reflux has been under excellent control on her current medical plan.  However, about 10 days ago she developed a low-grade fever and nasal congestion and head fullness and a very bad headache and possibly some ugly nasal discharge initially for which she was treated with a Z-Pak and a low dose of prednisone for 4 days. This has not really helped her and in fact she has progressed regarding these symptoms over the course of the past 10 days. She requests an antibiotic and another systemic steroid as this has work quite well for her in the past and she does not want to revert back to her episode of chronic sinusitis that was a significant problem requiring surgery in the past.    Medication List      acetaminophen 500 MG tablet Commonly known as:  TYLENOL Take 1,000 mg by mouth every 6 (six) hours as needed for pain. Reported on 05/15/2015   ALPRAZolam 0.5 MG  tablet Commonly known as:  XANAX Take by mouth.   beclomethasone 80 MCG/ACT inhaler Commonly known as:  QVAR Inhale 2 puffs into the lungs 2 (two) times daily. Reported on 05/15/2015   brinzolamide 1 % ophthalmic suspension Commonly known as:  AZOPT Place 1 drop into both eyes 2 (two) times daily.   cetirizine 10 MG tablet Commonly known as:  ZYRTEC Take 1 tablet (10 mg total) by mouth daily.   cholecalciferol 400 units Tabs tablet Commonly known as:  VITAMIN D Take 400 Units by mouth daily. Reported on 05/15/2015   dexlansoprazole 60 MG capsule Commonly known as:  DEXILANT Take 1 capsule (60 mg total) by mouth daily.   EPINEPHrine 0.3 mg/0.3 mL Soaj injection Commonly known as:  EPIPEN 2-PAK Inject 0.3 mLs (0.3 mg total) into the muscle once.   escitalopram 20 MG tablet Commonly known as:  LEXAPRO Take 20 mg by mouth daily.   gabapentin 300 MG capsule Commonly known as:  NEURONTIN Take 1 capsule (300 mg total) by mouth at bedtime.   latanoprost 0.005 % ophthalmic solution Commonly known as:  XALATAN Place 1 drop into both eyes at bedtime.   levofloxacin 500 MG tablet Commonly known as:  LEVAQUIN Take 1 tablet (500 mg total) by mouth daily.   mometasone-formoterol 100-5 MCG/ACT Aero Commonly known as:  DULERA Inhale 2 puffs into the lungs 2 (two) times daily.   mometasone-formoterol 200-5 MCG/ACT Aero Commonly known as:  DULERA Inhale 2 puffs into the lungs 2 (two) times daily.   montelukast 10 MG tablet Commonly known  as:  SINGULAIR Take 1 tablet (10 mg total) by mouth at bedtime.   omalizumab 150 MG injection Commonly known as:  XOLAIR Inject 300 mg into the skin every 28 (twenty-eight) days.   QNASL 80 MCG/ACT Aers Generic drug:  Beclomethasone Dipropionate Place 1 spray into the nose daily. Reported on 05/15/2015   ranitidine 300 MG tablet Commonly known as:  ZANTAC Take 300 mg by mouth at bedtime.   valACYclovir 500 MG tablet Commonly known  as:  VALTREX Take 1 tablet (500 mg total) by mouth 2 (two) times daily.       Past Medical History:  Diagnosis Date  . Allergy history unknown   . Asthma   . Breast cancer ([place]) 03/24/2011  . Bronchitis   . Dizziness   . GERD (gastroesophageal reflux disease)   . Glaucoma   . Headache(784.0)   . Pneumonia    hx of  . PONV (postoperative nausea and vomiting)   . Shortness of breath    asthma  . Sinusitis     Past Surgical History:  Procedure Laterality Date  . BREAST LUMPECTOMY  08-2009   left   . EYE SURGERY Bilateral    laser surgery - glaucoma  . eyelid surgery Bilateral    for glaucoma  . NOSE SURGERY  12-2006  . OOPHORECTOMY  2001  . SINUS ENDO W/FUSION Bilateral 02/16/2013   Procedure: ENDOSCOPIC SINUS SURGERY WITH FUSION NAVIGATION;  Surgeon: Jerrell Belfast, MD;  Location: [facility];  Service: ENT;  Laterality: Bilateral;    Allergies  Allergen Reactions  . Amoxicillin-Pot Clavulanate Nausea And Vomiting  . Doxycycline Nausea And Vomiting  . Nitrofurantoin Macrocrystal Other (See Comments)    Edema Edema  . Pregabalin Other (See Comments)    Blurry vision Blurry vision    Review of systems negative except as noted in HPI / PMHx or noted below:  Review of Systems  Constitutional: Negative.   HENT: Negative.   Eyes: Negative.   Respiratory: Negative.   Cardiovascular: Negative.   Gastrointestinal: Negative.   Genitourinary: Negative.   Musculoskeletal: Negative.   Skin: Negative.   Neurological: Negative.   Endo/Heme/Allergies: Negative.   Psychiatric/Behavioral: Negative.      Objective:   Vitals:   02/12/16 1137  BP: 130/90  Pulse: 80  Resp: 16  Temp: 98.4 F (36.9 C)          Physical Exam  Constitutional: She is well-developed, well-nourished, and in no distress.  Nasal voice  HENT:  Head: Normocephalic.  Right Ear: Tympanic membrane, external ear and ear canal normal.  Left Ear: Tympanic membrane, external ear and ear canal  normal.  Nose: Mucosal edema (Erythematous) present. No rhinorrhea.  Mouth/Throat: Uvula is midline, oropharynx is clear and moist and mucous membranes are normal. No oropharyngeal exudate.  Eyes: Conjunctivae are normal.  Neck: Trachea normal. No tracheal tenderness present. No tracheal deviation present. No thyromegaly present.  Cardiovascular: Normal rate, regular rhythm, S1 normal, S2 normal and normal heart sounds.   No murmur heard. Pulmonary/Chest: Breath sounds normal. No stridor. No respiratory distress. She has no wheezes. She has no rales.  Musculoskeletal: She exhibits no edema.  Lymphadenopathy:       Head (right side): No tonsillar adenopathy present.       Head (left side): No tonsillar adenopathy present.    She has no cervical adenopathy.  Neurological: She is alert. Gait normal.  Skin: No rash noted. She is not diaphoretic. No erythema. Nails show no clubbing.  Psychiatric: Mood and affect normal.    Diagnostics:    Spirometry was performed and demonstrated an FEV1 of 2.03 at 100 % of predicted.   Assessment and Plan:   1. Moderate persistent asthma, uncomplicated   2. Acute maxillary sinusitis, recurrence not specified   3. Other allergic rhinitis   4. LPRD (laryngopharyngeal reflux disease)     1. Levofloxacin 500 one tablet one time a day for 10 days plus depomedrol 80 IM administered in clinic  2. Lots of nasal saline multiple times per day  3. Continue Qnasl 1-2 puffs one time per day  4. Restart Dulera 200 2 inhalations two times per day during "flare up"  5. Continue montelukast 10mg  one tablet one time per day  6. Continue Omalizumab 150 monthly (and Epi-Pen)  7. Continue dexilant 60mg  in AM plus ranitidine 300 in PM  8. Continue cetirizine and albuterol MDI if needed.  9. When better, obtain fall flu vaccine  10. return in 6 months or earlier if problem  I will treat [facility] with a systemic steroid and broad-spectrum antibiotics to treat her  sinus infection. She is intolerant of Augmentin and Omnicef because of the GI issue and we will use a quinolone at this point. She will continue to use anti-inflammatory agents for her respiratory tract as noted above and also continue to use aggressive therapy directed against reflux. Both of these approaches have worked very well up until her most recent infection. I will see her back in this clinic in 6 months or earlier if there is a problem.  Allena Katz, MD [place]

## 2016-02-12 NOTE — Patient Instructions (Addendum)
  1. Levofloxacin 500 one tablet one time a day for 10 days plus depomedrol 80 IM administered in clinic  2. Lots of nasal saline multiple times per day  3. Continue Qnasl 1-2 puffs one time per day  4. Restart Dulera 200 2 inhalations two times per day during "flare up"  5. Continue montelukast 10mg  one tablet one time per day  6. Continue Omalizumab 150 monthly (and Epi-Pen)  7. Continue dexilant 60mg  in AM plus ranitidine 300 in PM  8. Continue cetirizine and albuterol MDI if needed.  9. When better, obtain fall flu vaccine  10. return in 6 months or earlier if problem

## 2016-02-18 ENCOUNTER — Telehealth: Payer: Self-pay | Admitting: Allergy and Immunology

## 2016-02-18 DIAGNOSIS — R059 Cough, unspecified: Secondary | ICD-10-CM

## 2016-02-18 DIAGNOSIS — R05 Cough: Secondary | ICD-10-CM

## 2016-02-18 NOTE — Telephone Encounter (Signed)
Informed patient to get chest x-ray and keep appointment for tomorrow so Dr. Neldon Mc to discuss results.

## 2016-02-18 NOTE — Telephone Encounter (Signed)
Pt called and said that she is still sick and having very bad headaches and she feels very sore and her back lung is swolling. She thing predisone will help. I made her appointment for tomorrow morning. [phone].

## 2016-02-18 NOTE — Telephone Encounter (Signed)
Please have her obtain a chest x-ray today that we can review tomorrow

## 2016-02-19 ENCOUNTER — Ambulatory Visit (INDEPENDENT_AMBULATORY_CARE_PROVIDER_SITE_OTHER): Admitting: Allergy and Immunology

## 2016-02-19 ENCOUNTER — Ambulatory Visit
Admission: RE | Admit: 2016-02-19 | Discharge: 2016-02-19 | Disposition: A | Discharge status: Home / Self Care | Source: Ambulatory Visit | Attending: Allergy and Immunology | Admitting: Allergy and Immunology

## 2016-02-19 ENCOUNTER — Encounter: Payer: Self-pay | Admitting: Allergy and Immunology

## 2016-02-19 VITALS — BP 118/76 | HR 74 | Resp 18

## 2016-02-19 DIAGNOSIS — J4540 Moderate persistent asthma, uncomplicated: Secondary | ICD-10-CM

## 2016-02-19 DIAGNOSIS — K219 Gastro-esophageal reflux disease without esophagitis: Secondary | ICD-10-CM | POA: Diagnosis not present

## 2016-02-19 DIAGNOSIS — R059 Cough, unspecified: Secondary | ICD-10-CM

## 2016-02-19 DIAGNOSIS — J3089 Other allergic rhinitis: Secondary | ICD-10-CM

## 2016-02-19 DIAGNOSIS — J321 Chronic frontal sinusitis: Secondary | ICD-10-CM

## 2016-02-19 DIAGNOSIS — R05 Cough: Secondary | ICD-10-CM

## 2016-02-19 MED ORDER — CLINDAMYCIN HCL 300 MG PO CAPS: ORAL_CAPSULE | ORAL | 0 refills | Status: DC

## 2016-02-19 NOTE — Patient Instructions (Signed)
  1. Clindamycin 300 - 3 times a day for 10 days plus prednisone 10 mg once a day for 10 days   2. Lots of nasal saline multiple times per day  3. Continue Qnasl 1-2 puffs one time per day  4. Continue Dulera 200 2 inhalations two times per day during "flare up"  5. Continue montelukast 10mg  one tablet one time per day  6. Continue Omalizumab 150 monthly (and Epi-Pen)  7. Continue dexilant 60mg  in AM plus ranitidine 300 in PM  8. Continue cetirizine and albuterol MDI if needed.  9. When better, obtain fall flu vaccine  10. return in 6 months or earlier if problem

## 2016-02-19 NOTE — Progress Notes (Signed)
Follow-up Note  Referring Provider: Nena Polio, NP Primary Provider: Nena Polio, NP Date of Office Visit: 02/19/2016  Subjective:   [name] (DOB: [birth year]) is a 65 y.o. female who returns to the [facility] on 02/19/2016 in re-evaluation of the following:  HPI: Timaya returns to this clinic noting that she still continues to have significant problems since her respiratory tract infection that was addressed on 02/12/2016 with use of a systemic steroid injection and the administration of a quinolone. She still continues to have very significant headache and her head feels extremely full. As well, she has developed a left posterior chest pleuritic pain. A fair amount of her cough has resolved. She does not have any shortness of breath or chest tightness and she does not have any sputum production. She has not had use a short acting bronchodilator. Her reflux is under very good control.    Medication List      acetaminophen 500 MG tablet Commonly known as:  TYLENOL Take 1,000 mg by mouth every 6 (six) hours as needed for pain. Reported on 05/15/2015   ALPRAZolam 0.5 MG tablet Commonly known as:  XANAX Take by mouth.   brinzolamide 1 % ophthalmic suspension Commonly known as:  AZOPT Place 1 drop into both eyes 2 (two) times daily.   cetirizine 10 MG tablet Commonly known as:  ZYRTEC Take 1 tablet (10 mg total) by mouth daily.   cholecalciferol 400 units Tabs tablet Commonly known as:  VITAMIN D Take 400 Units by mouth daily. Reported on 05/15/2015   dexlansoprazole 60 MG capsule Commonly known as:  DEXILANT Take 1 capsule (60 mg total) by mouth daily.   EPINEPHrine 0.3 mg/0.3 mL Soaj injection Commonly known as:  EPIPEN 2-PAK Inject 0.3 mLs (0.3 mg total) into the muscle once.   escitalopram 20 MG tablet Commonly known as:  LEXAPRO Take 20 mg by mouth daily.   gabapentin 300 MG capsule Commonly known as:  NEURONTIN Take 1 capsule (300  mg total) by mouth at bedtime.   latanoprost 0.005 % ophthalmic solution Commonly known as:  XALATAN Place 1 drop into both eyes at bedtime.   levofloxacin 500 MG tablet Commonly known as:  LEVAQUIN Take 1 tablet (500 mg total) by mouth daily.   mometasone-formoterol 200-5 MCG/ACT Aero Commonly known as:  DULERA Inhale 2 puffs into the lungs 2 (two) times daily.   montelukast 10 MG tablet Commonly known as:  SINGULAIR Take 1 tablet (10 mg total) by mouth at bedtime.   omalizumab 150 MG injection Commonly known as:  XOLAIR Inject 300 mg into the skin every 28 (twenty-eight) days.   QNASL 80 MCG/ACT Aers Generic drug:  Beclomethasone Dipropionate Place 1 spray into the nose daily. Reported on 05/15/2015   ranitidine 300 MG tablet Commonly known as:  ZANTAC Take 300 mg by mouth at bedtime.   valACYclovir 500 MG tablet Commonly known as:  VALTREX Take 1 tablet (500 mg total) by mouth 2 (two) times daily.       Past Medical History:  Diagnosis Date  . Allergy history unknown   . Asthma   . Breast cancer ([place]) 03/24/2011  . Bronchitis   . Dizziness   . GERD (gastroesophageal reflux disease)   . Glaucoma   . Headache(784.0)   . Pneumonia    hx of  . PONV (postoperative nausea and vomiting)   . Shortness of breath    asthma  . Sinusitis  Past Surgical History:  Procedure Laterality Date  . BREAST LUMPECTOMY  08-2009   left   . EYE SURGERY Bilateral    laser surgery - glaucoma  . eyelid surgery Bilateral    for glaucoma  . NOSE SURGERY  12-2006  . OOPHORECTOMY  2001  . SINUS ENDO W/FUSION Bilateral 02/16/2013   Procedure: ENDOSCOPIC SINUS SURGERY WITH FUSION NAVIGATION;  Surgeon: Jerrell Belfast, MD;  Location: [facility];  Service: ENT;  Laterality: Bilateral;    Allergies  Allergen Reactions  . Amoxicillin-Pot Clavulanate Nausea And Vomiting  . Doxycycline Nausea And Vomiting  . Nitrofurantoin Macrocrystal Other (See Comments)    Edema Edema  .  Pregabalin Other (See Comments)    Blurry vision Blurry vision    Review of systems negative except as noted in HPI / PMHx or noted below:  ROS   Objective:   Vitals:   02/19/16 0908  BP: 118/76  Pulse: 74  Resp: 18          Physical Exam  Constitutional: She is well-developed, well-nourished, and in no distress.  Nasal voice  HENT:  Head: Normocephalic.  Right Ear: Tympanic membrane, external ear and ear canal normal.  Left Ear: Tympanic membrane, external ear and ear canal normal.  Nose: Nose normal. No mucosal edema or rhinorrhea.  Mouth/Throat: Uvula is midline, oropharynx is clear and moist and mucous membranes are normal. No oropharyngeal exudate.  Eyes: Conjunctivae are normal.  Neck: Trachea normal. No tracheal tenderness present. No tracheal deviation present. No thyromegaly present.  Cardiovascular: Normal rate, regular rhythm, S1 normal, S2 normal and normal heart sounds.   No murmur heard. Pulmonary/Chest: Breath sounds normal. No stridor. No respiratory distress. She has no wheezes. She has no rales.  Musculoskeletal: She exhibits no edema or tenderness (No chest wall tenderness to palpation).  Lymphadenopathy:       Head (right side): No tonsillar adenopathy present.       Head (left side): No tonsillar adenopathy present.    She has no cervical adenopathy.  Neurological: She is alert. Gait normal.  Skin: No rash noted. She is not diaphoretic. No erythema. Nails show no clubbing.  Psychiatric: Mood and affect normal.    Diagnostics: Results of a chest x-ray obtained on 02/19/2016 identified no significant abnormality   Spirometry was performed and demonstrated an FEV1 of 1.70 at 78 % of predicted.  Assessment and Plan:   1. Chronic frontal sinusitis   2. Moderate persistent asthma, uncomplicated   3. Other allergic rhinitis   4. LPRD (laryngopharyngeal reflux disease)     1. Clindamycin 300 - 3 times a day for 10 days plus prednisone 10 mg once a  day for 10 days   2. Lots of nasal saline multiple times per day  3. Continue Qnasl 1-2 puffs one time per day  4. Continue Dulera 200 2 inhalations two times per day during "flare up"  5. Continue montelukast 10mg  one tablet one time per day  6. Continue Omalizumab 150 monthly (and Epi-Pen)  7. Continue dexilant 60mg  in AM plus ranitidine 300 in PM  8. Continue cetirizine and albuterol MDI if needed.  9. When better, obtain fall flu vaccine  10. return in 6 months or earlier if problem  I will assume that Kaliyana has a persistent infection of her frontal sinuses and treat her with the therapy mentioned above. Given her prior history of chronic sinusitis requiring surgery I think we need to be quite aggressive about addressing her  issue. I've asked her to contact me should she have any problems with clindamycin use. She'll keep in contact with me noting her response that she moves forward.  Allena Katz, MD [place]

## 2016-03-19 ENCOUNTER — Telehealth: Payer: Self-pay | Admitting: Allergy and Immunology

## 2016-03-19 DIAGNOSIS — J4540 Moderate persistent asthma, uncomplicated: Secondary | ICD-10-CM

## 2016-03-19 NOTE — Telephone Encounter (Signed)
Please provide patient prednisone 10 mg a day for 10 days only. As well, please inform patient that we need to obtain blood tests in investigation of her recurrent respiratory tract issues and to see if she is a candidate for a biological agent. Please check CBC with differential, IgA/G/M, IgE, Aspergillus precipitins antibodies, ANCA with reflex.

## 2016-03-19 NOTE — Telephone Encounter (Signed)
Sample of Prednisone placed up front along with orders for labs and map with [facility] locations. . Pt advised of samples and blood work to be done.

## 2016-03-19 NOTE — Telephone Encounter (Signed)
Please advise 

## 2016-03-19 NOTE — Telephone Encounter (Signed)
Pt called and she she went [facility] yesterday and got a sinus infection and gave her anitibiotic and she wants prednisone but they would not give her any so she wants Dr, Neldon Mc to her prednisone. [phone].

## 2016-03-24 LAB — CBC WITH DIFFERENTIAL/PLATELET
BASOS PCT: 0 %
Basophils Absolute: 0 cells/uL (ref 0–200)
Eosinophils Absolute: 69 cells/uL (ref 15–500)
Eosinophils Relative: 1 %
HEMATOCRIT: 37.3 % (ref 35.0–45.0)
HEMOGLOBIN: 11.9 g/dL (ref 11.7–15.5)
LYMPHS ABS: 483 {cells}/uL — AB (ref 850–3900)
Lymphocytes Relative: 7 %
MCH: 25.8 pg — ABNORMAL LOW (ref 27.0–33.0)
MCHC: 31.9 g/dL — ABNORMAL LOW (ref 32.0–36.0)
MCV: 80.7 fL (ref 80.0–100.0)
MONO ABS: 276 {cells}/uL (ref 200–950)
MPV: 9.4 fL (ref 7.5–12.5)
Monocytes Relative: 4 %
NEUTROS ABS: 6072 {cells}/uL (ref 1500–7800)
Neutrophils Relative %: 88 %
Platelets: 342 10*3/uL (ref 140–400)
RBC: 4.62 MIL/uL (ref 3.80–5.10)
RDW: 17 % — AB (ref 11.0–15.0)
WBC: 6.9 10*3/uL (ref 3.8–10.8)

## 2016-03-24 LAB — IGG, IGA, IGM
IGA: 102 mg/dL (ref 81–463)
IGM, SERUM: 85 mg/dL (ref 48–271)
IgG (Immunoglobin G), Serum: 915 mg/dL (ref 694–1618)

## 2016-03-24 LAB — ANCA SCREEN W REFLEX TITER: ANCA Screen: NEGATIVE

## 2016-03-30 LAB — ASPERGILLUS IGG PRECIPITINS PANEL
ASPERGILLUS FLAVUS: NEGATIVE
ASPERGILLUS NIDULANS: NEGATIVE
ASPERGILLUS VERSICOLOR: NEGATIVE
Aspergillus amstelodami/glaucus*: NEGATIVE
Aspergillus fumigatus Mix*: NEGATIVE
Aspergillus niger*: POSITIVE — AB
Aspergillus terreus*: NEGATIVE

## 2016-03-31 ENCOUNTER — Ambulatory Visit (INDEPENDENT_AMBULATORY_CARE_PROVIDER_SITE_OTHER): Admitting: *Deleted

## 2016-03-31 DIAGNOSIS — J4540 Moderate persistent asthma, uncomplicated: Secondary | ICD-10-CM | POA: Diagnosis not present

## 2016-04-28 ENCOUNTER — Ambulatory Visit

## 2016-04-29 ENCOUNTER — Ambulatory Visit (INDEPENDENT_AMBULATORY_CARE_PROVIDER_SITE_OTHER)

## 2016-04-29 DIAGNOSIS — J4540 Moderate persistent asthma, uncomplicated: Secondary | ICD-10-CM

## 2016-05-21 ENCOUNTER — Telehealth: Payer: Self-pay | Admitting: Allergy and Immunology

## 2016-05-21 NOTE — Telephone Encounter (Signed)
Please advise 

## 2016-05-21 NOTE — Telephone Encounter (Signed)
Patient called and states she has a sinus infection with green discharge. She is having surgery next Tuesday and would like something called in to get rid of it before her surgery. Last saw Dr. Neldon Mc on 02-19-16. Pharmacy is [facility].

## 2016-05-22 MED ORDER — AZITHROMYCIN 500 MG PO TABS: 500.0000 mg | ORAL_TABLET | Freq: Every day | ORAL | 0 refills | Status: DC

## 2016-05-22 NOTE — Telephone Encounter (Signed)
It appears she has recurrent sinusitis with history of sinus surgery and per Dr. Neldon Mc "given her prior history of chronic sinusitis requiring surgery I think we need to be quite aggressive about addressing her issue".   Please make sure she has a follow-up appt in place with Dr. Neldon Mc as he wanted to address her positive mold testing.   She was last treated with Clindamycin 300mg  TID x 10days and prednisone 10mg  x 10 days (hx of PCN allergy).   If she tolerated the clindamycin previously can send in this script for her.

## 2016-05-22 NOTE — Telephone Encounter (Signed)
That is fine.  Would do the Azithromcyin 500mg  x 3 day course

## 2016-05-22 NOTE — Telephone Encounter (Signed)
Dr. Nelva Bush she would like to know if she could take a Z pack?

## 2016-05-22 NOTE — Telephone Encounter (Signed)
Medication sent into pharmacy. Patient aware 

## 2016-05-27 ENCOUNTER — Ambulatory Visit

## 2016-06-01 ENCOUNTER — Telehealth: Payer: Self-pay | Admitting: Allergy and Immunology

## 2016-06-01 NOTE — Telephone Encounter (Signed)
Patient called to make a payment and her statement reflects a different balance due. Please call her back. Thanks

## 2016-06-02 NOTE — Telephone Encounter (Signed)
Called pt & explained her bal

## 2016-06-24 ENCOUNTER — Telehealth: Payer: Self-pay | Admitting: Allergy and Immunology

## 2016-06-24 NOTE — Telephone Encounter (Signed)
Patient called today to make a payment based off of a bill that she received. She has a question about remaining balance and the service dates . Please call her on her cell phone tomorrow 06/25/2016.

## 2016-06-25 ENCOUNTER — Other Ambulatory Visit: Payer: Self-pay | Admitting: *Deleted

## 2016-06-25 ENCOUNTER — Ambulatory Visit (INDEPENDENT_AMBULATORY_CARE_PROVIDER_SITE_OTHER)

## 2016-06-25 DIAGNOSIS — J4540 Moderate persistent asthma, uncomplicated: Secondary | ICD-10-CM | POA: Diagnosis not present

## 2016-06-25 MED ORDER — MONTELUKAST SODIUM 10 MG PO TABS: 10.0000 mg | ORAL_TABLET | Freq: Every day | ORAL | 3 refills | Status: DC

## 2016-06-25 NOTE — Telephone Encounter (Signed)
[name]  Please see below

## 2016-06-25 NOTE — Telephone Encounter (Signed)
Explained bill to pt - she will call & pay soon - she was driving - kt

## 2016-07-17 ENCOUNTER — Encounter: Payer: Self-pay | Admitting: Allergy

## 2016-07-17 ENCOUNTER — Telehealth: Payer: Self-pay | Admitting: *Deleted

## 2016-07-17 ENCOUNTER — Encounter (INDEPENDENT_AMBULATORY_CARE_PROVIDER_SITE_OTHER): Payer: Self-pay

## 2016-07-17 ENCOUNTER — Ambulatory Visit (INDEPENDENT_AMBULATORY_CARE_PROVIDER_SITE_OTHER): Admitting: Allergy

## 2016-07-17 VITALS — BP 150/90 | HR 88 | Temp 98.5°F | Resp 16

## 2016-07-17 DIAGNOSIS — J4540 Moderate persistent asthma, uncomplicated: Secondary | ICD-10-CM | POA: Diagnosis not present

## 2016-07-17 DIAGNOSIS — K219 Gastro-esophageal reflux disease without esophagitis: Secondary | ICD-10-CM

## 2016-07-17 DIAGNOSIS — J3089 Other allergic rhinitis: Secondary | ICD-10-CM

## 2016-07-17 DIAGNOSIS — J0101 Acute recurrent maxillary sinusitis: Secondary | ICD-10-CM | POA: Diagnosis not present

## 2016-07-17 MED ORDER — MOXIFLOXACIN HCL 400 MG PO TABS: 400.0000 mg | ORAL_TABLET | Freq: Every day | ORAL | 0 refills | Status: DC

## 2016-07-17 NOTE — Progress Notes (Signed)
Follow-up Note  RE: [name] MRN: [id number] DOB: 07-13-1950 Date of Office Visit: 07/17/2016   History of present illness: [name] is a 66 y.o. female presenting today for sick visit.  She has a history of recurrent sinus infections treated with antibiotics and steroids most recently was in January.   She has a history of several sinus surgeries.  She was last seen by Dr. Neldon Mc on 02/19/2016 also at which time she was treated for chronic frontal sinusitis with a course of clindamycin.    She reports having headache and poor sleep yesterday.  She also reports a lot of sinus pressure and pain in her maxillary and frontal sinuses that has worsened over the past 2 days.  She also has nasal drainage that is worse than usual and drainage is thick and greenish in color.  She also has dry cough and some wheezing.   No fevers but does feel chilled.  Denies any sick contacts.  She is on Xolair.  She has Dulera that she has been advised to start during illnesses. She has not started this back up at this time.  She continues on her Singulair as well as Zyrtec and Dexilant. She has been using her saline rinses daily and is using her Qnasl. She also took Mucinex this morning.     Review of systems: Review of Systems  Constitutional: Positive for chills and malaise/fatigue. Negative for fever.  HENT: Positive for congestion and sinus pain. Negative for ear discharge, ear pain, nosebleeds, sore throat and tinnitus.   Eyes: Negative for discharge and redness.  Respiratory: Positive for cough and wheezing. Negative for sputum production and shortness of breath.   Cardiovascular: Negative for chest pain.  Gastrointestinal: Negative for abdominal pain, diarrhea, nausea and vomiting.  Musculoskeletal: Negative for joint pain and myalgias.  Skin: Negative for itching and rash.  Neurological: Positive for headaches.    All other systems negative unless noted above in HPI  Past  medical/social/surgical/family history have been reviewed and are unchanged unless specifically indicated below.  No changes  Medication List: Allergies as of 07/17/2016      Reactions   Amoxicillin-pot Clavulanate Nausea And Vomiting   Doxycycline Nausea And Vomiting   Nitrofurantoin Macrocrystal Other (See Comments)   Edema Edema   Pregabalin Other (See Comments)   Blurry vision Blurry vision      Medication List       Accurate as of 07/17/16  5:06 PM. Always use your most recent med list.          acetaminophen 500 MG tablet Commonly known as:  TYLENOL Take 1,000 mg by mouth every 6 (six) hours as needed for pain. Reported on 05/15/2015   ALPRAZolam 0.5 MG tablet Commonly known as:  XANAX Take by mouth.   brinzolamide 1 % ophthalmic suspension Commonly known as:  AZOPT Place 1 drop into both eyes 2 (two) times daily.   cetirizine 10 MG tablet Commonly known as:  ZYRTEC Take 1 tablet (10 mg total) by mouth daily.   cholecalciferol 400 units Tabs tablet Commonly known as:  VITAMIN D Take 400 Units by mouth daily. Reported on 05/15/2015   dexlansoprazole 60 MG capsule Commonly known as:  DEXILANT Take 1 capsule (60 mg total) by mouth daily.   EPINEPHrine 0.3 mg/0.3 mL Soaj injection Commonly known as:  EPIPEN 2-PAK Inject 0.3 mLs (0.3 mg total) into the muscle once.   escitalopram 20 MG tablet Commonly known as:  LEXAPRO  Take 20 mg by mouth daily.   gabapentin 300 MG capsule Commonly known as:  NEURONTIN Take 1 capsule (300 mg total) by mouth at bedtime.   latanoprost 0.005 % ophthalmic solution Commonly known as:  XALATAN Place 1 drop into both eyes at bedtime.   mometasone-formoterol 200-5 MCG/ACT Aero Commonly known as:  DULERA Inhale 2 puffs into the lungs 2 (two) times daily.   montelukast 10 MG tablet Commonly known as:  SINGULAIR Take 1 tablet (10 mg total) by mouth at bedtime.   moxifloxacin 400 MG tablet Commonly known as:  AVELOX Take 1  tablet (400 mg total) by mouth daily at 8 pm.   omalizumab 150 MG injection Commonly known as:  XOLAIR Inject 300 mg into the skin every 28 (twenty-eight) days.   QNASL 80 MCG/ACT Aers Generic drug:  Beclomethasone Dipropionate Place 1 spray into the nose daily. Reported on 05/15/2015   ranitidine 300 MG tablet Commonly known as:  ZANTAC Take 300 mg by mouth at bedtime.       Known medication allergies: Allergies  Allergen Reactions  . Amoxicillin-Pot Clavulanate Nausea And Vomiting  . Doxycycline Nausea And Vomiting  . Nitrofurantoin Macrocrystal Other (See Comments)    Edema Edema  . Pregabalin Other (See Comments)    Blurry vision Blurry vision     Physical examination: Blood pressure (!) 150/90, pulse 88, temperature 98.5 F (36.9 C), temperature source Oral, resp. rate 16.  General: Alert, interactive, in no acute distress. HEENT: TMs pearly gray, turbinates moderately edematous with thick discharge, post-pharynx non erythematous.  Extreme TTP over maxillary sinus bilaterally and frontal sinus Neck: Supple without lymphadenopathy. Lungs: Clear to auscultation without wheezing, rhonchi or rales. {no increased work of breathing. CV: Normal S1, S2 without murmurs. Abdomen: Nondistended, nontender. Skin: Warm and dry, without lesions or rashes. Extremities:  No clubbing, cyanosis or edema. Neuro:   Grossly intact.  Diagnositics/Labs: None today   Assessment and plan:   Acute on chronic sinusitis   - She has extreme tenderness over her maxillary and frontal sinuses as well as worsening nasal drainage worrisome for another infection.  Given her history her primary allergist has elected to treat her aggressively when she does have signs and symptoms of infection.  We'll treat as below.  1. Avelox 400mg - daily for 10 days plus prednisone 20mg  twice day x 3 days, then 20mg  x 1 day then 10mg  x 1 day then stop.    2. Lots of nasal saline multiple times per day  3.  Continue Qnasl 1-2 puffs one time per day  4. Continue Dulera 200 2 inhalations two times per day during "flare up"  5. Continue montelukast 10mg  one tablet one time per day  6. Continue Omalizumab 150 monthly (and Epi-Pen)  7. Continue dexilant 60mg  in AM plus ranitidine 300 in PM  8. Continue cetirizine and albuterol MDI if needed.  9.  Mucinex 1200mg  up to twice a day to help thin/loosen mucus.  Drink with lots of water  10. return in 6 months or earlier if problem   I appreciate the opportunity to take part in [facility] care. Please do not hesitate to contact me with questions.  Sincerely,   [name] Feeler, MD Allergy/Immunology [facility]

## 2016-07-17 NOTE — Telephone Encounter (Signed)
error 

## 2016-07-17 NOTE — Patient Instructions (Addendum)
  1. Avelox 400mg - daily for 10 days plus prednisone 20mg  twice day x 3 days, then 20mg  x 1 day then 10mg  x 1 day then stop.    2. Lots of nasal saline multiple times per day  3. Continue Qnasl 1-2 puffs one time per day  4. Continue Dulera 200 2 inhalations two times per day during "flare up"  5. Continue montelukast 10mg  one tablet one time per day  6. Continue Omalizumab 150 monthly (and Epi-Pen)  7. Continue dexilant 60mg  in AM plus ranitidine 300 in PM  8. Continue cetirizine and albuterol MDI if needed.  9.  Mucinex 1200mg  up to twice a day to help thin/loosen mucus.  Drink with lots of water  10. return in 6 months or earlier if problem

## 2016-08-21 ENCOUNTER — Ambulatory Visit

## 2016-08-25 ENCOUNTER — Ambulatory Visit (INDEPENDENT_AMBULATORY_CARE_PROVIDER_SITE_OTHER): Admitting: *Deleted

## 2016-08-25 DIAGNOSIS — J4540 Moderate persistent asthma, uncomplicated: Secondary | ICD-10-CM | POA: Diagnosis not present

## 2016-09-15 ENCOUNTER — Emergency Department (HOSPITAL_COMMUNITY)

## 2016-09-15 ENCOUNTER — Encounter (HOSPITAL_BASED_OUTPATIENT_CLINIC_OR_DEPARTMENT_OTHER): Payer: Self-pay

## 2016-09-15 ENCOUNTER — Emergency Department (HOSPITAL_BASED_OUTPATIENT_CLINIC_OR_DEPARTMENT_OTHER)

## 2016-09-15 ENCOUNTER — Observation Stay (HOSPITAL_BASED_OUTPATIENT_CLINIC_OR_DEPARTMENT_OTHER)
Admission: EM | Admit: 2016-09-15 | Discharge: 2016-09-16 | Disposition: A | Discharge status: Home / Self Care | Attending: Internal Medicine | Admitting: Internal Medicine

## 2016-09-15 DIAGNOSIS — Z7951 Long term (current) use of inhaled steroids: Secondary | ICD-10-CM | POA: Diagnosis not present

## 2016-09-15 DIAGNOSIS — Z79899 Other long term (current) drug therapy: Secondary | ICD-10-CM | POA: Insufficient documentation

## 2016-09-15 DIAGNOSIS — I739 Peripheral vascular disease, unspecified: Secondary | ICD-10-CM | POA: Diagnosis not present

## 2016-09-15 DIAGNOSIS — Z880 Allergy status to penicillin: Secondary | ICD-10-CM | POA: Insufficient documentation

## 2016-09-15 DIAGNOSIS — E785 Hyperlipidemia, unspecified: Secondary | ICD-10-CM | POA: Insufficient documentation

## 2016-09-15 DIAGNOSIS — Z9889 Other specified postprocedural states: Secondary | ICD-10-CM | POA: Insufficient documentation

## 2016-09-15 DIAGNOSIS — Z836 Family history of other diseases of the respiratory system: Secondary | ICD-10-CM | POA: Insufficient documentation

## 2016-09-15 DIAGNOSIS — Z803 Family history of malignant neoplasm of breast: Secondary | ICD-10-CM | POA: Diagnosis not present

## 2016-09-15 DIAGNOSIS — Z8249 Family history of ischemic heart disease and other diseases of the circulatory system: Secondary | ICD-10-CM | POA: Insufficient documentation

## 2016-09-15 DIAGNOSIS — Z881 Allergy status to other antibiotic agents status: Secondary | ICD-10-CM | POA: Diagnosis not present

## 2016-09-15 DIAGNOSIS — Z853 Personal history of malignant neoplasm of breast: Secondary | ICD-10-CM | POA: Insufficient documentation

## 2016-09-15 DIAGNOSIS — G459 Transient cerebral ischemic attack, unspecified: Principal | ICD-10-CM | POA: Insufficient documentation

## 2016-09-15 DIAGNOSIS — R269 Unspecified abnormalities of gait and mobility: Secondary | ICD-10-CM

## 2016-09-15 DIAGNOSIS — I159 Secondary hypertension, unspecified: Secondary | ICD-10-CM | POA: Insufficient documentation

## 2016-09-15 DIAGNOSIS — J45909 Unspecified asthma, uncomplicated: Secondary | ICD-10-CM | POA: Insufficient documentation

## 2016-09-15 DIAGNOSIS — R42 Dizziness and giddiness: Secondary | ICD-10-CM

## 2016-09-15 DIAGNOSIS — K219 Gastro-esophageal reflux disease without esophagitis: Secondary | ICD-10-CM | POA: Insufficient documentation

## 2016-09-15 DIAGNOSIS — C50919 Malignant neoplasm of unspecified site of unspecified female breast: Secondary | ICD-10-CM | POA: Diagnosis present

## 2016-09-15 DIAGNOSIS — Z888 Allergy status to other drugs, medicaments and biological substances status: Secondary | ICD-10-CM | POA: Diagnosis not present

## 2016-09-15 DIAGNOSIS — H538 Other visual disturbances: Secondary | ICD-10-CM

## 2016-09-15 LAB — DIFFERENTIAL
Basophils Absolute: 0 10*3/uL (ref 0.0–0.1)
Basophils Relative: 1 %
EOS PCT: 7 %
Eosinophils Absolute: 0.5 10*3/uL (ref 0.0–0.7)
LYMPHS ABS: 1.3 10*3/uL (ref 0.7–4.0)
LYMPHS PCT: 18 %
Monocytes Absolute: 0.7 10*3/uL (ref 0.1–1.0)
Monocytes Relative: 10 %
NEUTROS ABS: 4.6 10*3/uL (ref 1.7–7.7)
NEUTROS PCT: 64 %

## 2016-09-15 LAB — CBC
HCT: 38.8 % (ref 36.0–46.0)
HEMOGLOBIN: 12.5 g/dL (ref 12.0–15.0)
MCH: 26 pg (ref 26.0–34.0)
MCHC: 32.2 g/dL (ref 30.0–36.0)
MCV: 80.8 fL (ref 78.0–100.0)
Platelets: 311 10*3/uL (ref 150–400)
RBC: 4.8 MIL/uL (ref 3.87–5.11)
RDW: 14.5 % (ref 11.5–15.5)
WBC: 7.1 10*3/uL (ref 4.0–10.5)

## 2016-09-15 LAB — COMPREHENSIVE METABOLIC PANEL
ALBUMIN: 4.3 g/dL (ref 3.5–5.0)
ALK PHOS: 124 U/L (ref 38–126)
ALT: 19 U/L (ref 14–54)
ANION GAP: 8 (ref 5–15)
AST: 25 U/L (ref 15–41)
BILIRUBIN TOTAL: 0.3 mg/dL (ref 0.3–1.2)
BUN: 19 mg/dL (ref 6–20)
CALCIUM: 9 mg/dL (ref 8.9–10.3)
CO2: 25 mmol/L (ref 22–32)
CREATININE: 1.06 mg/dL — AB (ref 0.44–1.00)
Chloride: 104 mmol/L (ref 101–111)
GFR calc Af Amer: >60 mL/min (ref >60)
GFR calc non Af Amer: 54 mL/min — ABNORMAL LOW (ref >60)
GLUCOSE: 132 mg/dL — AB (ref 65–99)
Potassium: 3.3 mmol/L — ABNORMAL LOW (ref 3.5–5.1)
Sodium: 137 mmol/L (ref 135–145)
TOTAL PROTEIN: 7.4 g/dL (ref 6.5–8.1)

## 2016-09-15 LAB — APTT: aPTT: 28 seconds (ref 24–36)

## 2016-09-15 LAB — PROTIME-INR
INR: 0.98
Prothrombin Time: 13 seconds (ref 11.4–15.2)

## 2016-09-15 LAB — CBG MONITORING, ED: Glucose-Capillary: 116 mg/dL — ABNORMAL HIGH (ref 65–99)

## 2016-09-15 LAB — TROPONIN I: Troponin I: <0.03 ng/mL (ref <0.03)

## 2016-09-15 MED ORDER — MECLIZINE HCL 25 MG PO TABS
25.0000 mg | ORAL_TABLET | Freq: Once | ORAL | Status: AC
  Administered 2016-09-15: 25 mg via ORAL
  Filled 2016-09-15: qty 1

## 2016-09-15 MED ORDER — IBUPROFEN 400 MG PO TABS
600.0000 mg | ORAL_TABLET | Freq: Once | ORAL | Status: AC
  Administered 2016-09-15: 600 mg via ORAL
  Filled 2016-09-15: qty 1

## 2016-09-15 MED ORDER — LORAZEPAM 2 MG/ML IJ SOLN
1.0000 mg | Freq: Once | INTRAMUSCULAR | Status: AC
  Administered 2016-09-15: 1 mg via INTRAVENOUS
  Filled 2016-09-15: qty 1

## 2016-09-15 MED ORDER — LORAZEPAM 1 MG PO TABS: 1.0000 mg | ORAL_TABLET | Freq: Once | ORAL | Status: DC

## 2016-09-15 MED ORDER — ACETAMINOPHEN 500 MG PO TABS: 1000.0000 mg | ORAL_TABLET | Freq: Once | ORAL | Status: DC

## 2016-09-15 MED ORDER — ASPIRIN EC 325 MG PO TBEC
325.0000 mg | DELAYED_RELEASE_TABLET | Freq: Once | ORAL | Status: AC
  Administered 2016-09-15: 325 mg via ORAL
  Filled 2016-09-15: qty 1

## 2016-09-15 NOTE — ED Provider Notes (Signed)
Care assumed from [facility], [place].  [name] is a 66 y.o. female presents with acute onset gait disturbance and dizziness.  Pt presented to [facility] where her neuro exam and CT was normal. Neuro consult with Dr. Shon Hale recommended MRI.  Meclazine given with improvement but without resolution.  Normal neuro exam by initial provider after txfr to [facility].    Physical Exam  BP 139/80   Pulse 74   Temp 98.4 F (36.9 C)   Resp 17   Ht 5\' 3"  (1.6 m)   Wt 74.4 kg   SpO2 98%   BMI 29.05 kg/m   Physical Exam  Constitutional: She appears well-developed and well-nourished. No distress.  HENT:  Head: Normocephalic.  Eyes: Conjunctivae are normal. No scleral icterus.  Neck: Normal range of motion.  Cardiovascular: Normal rate and intact distal pulses.   Pulmonary/Chest: Effort normal.  Musculoskeletal: Normal range of motion.  Neurological: She is alert.  Skin: Skin is warm and dry.  Nursing note and vitals reviewed.   ED Course  Procedures   Results for orders placed or performed during the hospital encounter of 09/15/16  Protime-INR  Result Value Ref Range   Prothrombin Time 13.0 11.4 - 15.2 seconds   INR 0.98   APTT  Result Value Ref Range   aPTT 28 24 - 36 seconds  CBC  Result Value Ref Range   WBC 7.1 4.0 - 10.5 K/uL   RBC 4.80 3.87 - 5.11 MIL/uL   Hemoglobin 12.5 12.0 - 15.0 g/dL   HCT 38.8 36.0 - 46.0 %   MCV 80.8 78.0 - 100.0 fL   MCH 26.0 26.0 - 34.0 pg   MCHC 32.2 30.0 - 36.0 g/dL   RDW 14.5 11.5 - 15.5 %   Platelets 311 150 - 400 K/uL  Differential  Result Value Ref Range   Neutrophils Relative % 64 %   Neutro Abs 4.6 1.7 - 7.7 K/uL   Lymphocytes Relative 18 %   Lymphs Abs 1.3 0.7 - 4.0 K/uL   Monocytes Relative 10 %   Monocytes Absolute 0.7 0.1 - 1.0 K/uL   Eosinophils Relative 7 %   Eosinophils Absolute 0.5 0.0 - 0.7 K/uL   Basophils Relative 1 %   Basophils Absolute 0.0 0.0 - 0.1 K/uL  Comprehensive metabolic panel  Result Value Ref Range   Sodium 137  135 - 145 mmol/L   Potassium 3.3 (L) 3.5 - 5.1 mmol/L   Chloride 104 101 - 111 mmol/L   CO2 25 22 - 32 mmol/L   Glucose, Bld 132 (H) 65 - 99 mg/dL   BUN 19 6 - 20 mg/dL   Creatinine, Ser 1.06 (H) 0.44 - 1.00 mg/dL   Calcium 9.0 8.9 - 10.3 mg/dL   Total Protein 7.4 6.5 - 8.1 g/dL   Albumin 4.3 3.5 - 5.0 g/dL   AST 25 15 - 41 U/L   ALT 19 14 - 54 U/L   Alkaline Phosphatase 124 38 - 126 U/L   Total Bilirubin 0.3 0.3 - 1.2 mg/dL   GFR calc non Af Amer 54 (L) >60 mL/min   GFR calc Af Amer >60 >60 mL/min   Anion gap 8 5 - 15  Troponin I  Result Value Ref Range   Troponin I <0.03 <0.03 ng/mL  POC CBG, ED  Result Value Ref Range   Glucose-Capillary 116 (H) 65 - 99 mg/dL   Mr Brain Wo Contrast (neuro Protocol)  Result Date: 09/15/2016 CLINICAL DATA:  Initial  evaluation for acute vertigo. EXAM: MRI HEAD WITHOUT CONTRAST TECHNIQUE: Multiplanar, multiecho pulse sequences of the brain and surrounding structures were obtained without intravenous contrast. COMPARISON:  Prior CT from earlier the same day. FINDINGS: Brain: Study degraded by motion artifact. Cerebral volume within normal limits for age. Minimal patchy T2/FLAIR hyperintensity present within the periventricular and deep white matter both cerebral hemispheres, nonspecific, but most like related chronic small vessel ischemic disease. Changes are minimal for age. No abnormal foci of restricted diffusion to suggest acute or subacute ischemia. Gray-white matter differentiation maintained. No evidence for acute intracranial hemorrhage. No areas of chronic infarction identified. No mass lesion, midline shift or mass effect. Ventricles normal size without evidence for hydrocephalus. No extra-axial fluid collection. Major dural sinuses are grossly patent. Pituitary gland within normal limits. Suprasellar region normal. Midline structures intact and normal. Vascular: Major intracranial vascular flow voids are maintained. Skull and upper cervical spine:  Craniocervical junction normal. Visualized upper cervical spine within normal limits. Bone marrow signal intensity normal. No scalp soft tissue abnormality. Sinuses/Orbits: Globes oral soft tissues within normal limits. Patient status post lens extraction bilaterally. Mild mucosal thickening within the left sphenoid sinus. Paranasal sinuses are otherwise largely clear. No mastoid effusion. Inner ear structures normal. Other: No other significant finding. IMPRESSION: 1. No acute intracranial process identified. 2. Minimal cerebral white matter changes for patient age, nonspecific, but most likely related to chronic microvascular ischemic disease. Electronically Signed   By: Jeannine Boga M.D.   On: 09/15/2016 23:33   Ct Head Code Stroke W/o Cm  Result Date: 09/15/2016 CLINICAL DATA:  Code stroke. 66 y/o F; dizziness, blurred vision, and stumbling gait. Headaches since 11 a.m. EXAM: CT HEAD WITHOUT CONTRAST TECHNIQUE: Contiguous axial images were obtained from the base of the skull through the vertex without intravenous contrast. COMPARISON:  None available. FINDINGS: Brain: No evidence of acute infarction, hemorrhage, hydrocephalus, extra-axial collection or mass lesion/mass effect. Vascular: Mild calcific atherosclerosis of the cavernous and paraclinoid internal carotid arteries. Skull: Normal. Negative for fracture or focal lesion. Sinuses/Orbits: Postsurgical changes related to ethmoidectomy and maxillary antrostomy are partially visualized. Trace posterior left mastoid air cell effusion with sclerosis compatible sequelae of chronic otomastoiditis. Mastoid air cells are otherwise normally aerated. Bilateral intra-ocular lens replacement. Other: None. ASPECTS [facility] Stroke Program Early CT Score) - Ganglionic level infarction (caudate, lentiform nuclei, internal capsule, insula, M1-M3 cortex): 7 - Supraganglionic infarction (M4-M6 cortex): 3 Total score (0-10 with 10 being normal): 10 IMPRESSION: 1. No  acute intracranial abnormality.  Unremarkable CT of the head. 2. ASPECTS is 10 These results were called by telephone at the time of interpretation on 09/15/2016 at 4:12 pm to Dr. Charlesetta Shanks , who verbally acknowledged these results. Electronically Signed   By: Kristine Garbe M.D.   On: 09/15/2016 16:13      Clinical Course as of Sep 17 103  Tue Sep 15, 2016  2009 Plan: Pending MRI.  Per Dr. Samson Frederic note, will reconsult neurology after MRI complete.      [HM]  Wed Sep 16, 2016  0040 Discussed with Dr. Cheral Marker who recommends admission to the Hospitalist for remainder of the stroke work-up.    [HM]  0103 Discussed with Dr. Hal Hope who will admit  [HM]    Clinical Course User Index [HM] Abigail Butts, PA-C     MDM Patient presents with complaints of blurred vision, dizziness and gait difficulty. CT scan and MRI unremarkable.  She was evaluated by neurology here in the emergency department  a few recommends admission for remainder of the stroke workup including echocardiogram and MRA of the brain and neck. Discussed with patient who agrees to this.  Blurred vision  Dizziness  Gait difficulty        Abigail Butts, PA-C 09/16/16 0105    Margette Fast, MD 09/16/16 [phone]

## 2016-09-15 NOTE — ED Provider Notes (Signed)
[facility] DEPT Provider Note   CSN: [id number] Arrival date & time: 09/15/16  1522     History   Chief Complaint Chief Complaint  Patient presents with  . Dizziness    HPI [name] is a 66 y.o. female  With history of recurrent sinusitis, breast cancer, GERD who presents today  Transferred from [place], [employer] with chief complaint  Sudden onset, constant dizziness which began earlier today. Patient states she was at work when at around 11 AM she began developing dizziness, lightheadedness, and bilateral hand shaking.  At that time she felt right hand shaking was worse than left  and persisted  for several hours until her presentation to [facility].  She states she felt her gait was unsteady for some time and like  She was not walking straight and at times had to hold onto something to steady herself.  She states "it feels like things are moving even when I am not ".  She states she was diagnosed with vertigo 2 years ago, but this feels differently than  That.  She endorses development of 5/10 dull HA several hours later that is  Frontal and to the back of neck.  Nothing has made it better or worse.  She also endorses sinus pressure and nasal congestion. She denies numbness tingling or weakness.  She denies diplopia but endorses some blurry vision and photophobia. She denies hearing changes, tinnitus, a sense of fullness, drop attacks, photophobia.   She was evaluated at [facility] and found to have a negative head CT. No focal neurological deficits.  Neurology was contacted and it was recommended that she get an  MRI for further evaluation and rule out of mass or stroke.  Meclizine was given prior to discharge, which patient states was helpful.   Denies fevers, chills, cp, SOB, abd, n.v.d, dysuria, hematuria, melena, sore throat, syncope, falls, or trauma    The history is provided by the patient.    Past Medical History:  Diagnosis Date  . Allergy  history unknown   . Asthma   . Breast cancer ([place]) 03/24/2011  . Bronchitis   . Dizziness   . GERD (gastroesophageal reflux disease)   . Glaucoma   . Headache(784.0)   . Pneumonia    hx of  . PONV (postoperative nausea and vomiting)   . Shortness of breath    asthma  . Sinusitis     Patient Active Problem List   Diagnosis Date Noted  . Acute sinusitis 05/15/2015  . Asthma with acute exacerbation 05/15/2015  . Vaginal candidiasis 05/15/2015  . History of breast cancer 10/16/2013  . Sinusitis, chronic 02/16/2013    Class: Chronic  . Asthmatic bronchitis with exacerbation 12/08/2012  . Chronic maxillary sinusitis 06/18/2012  . Allergic rhinitis due to other allergen 05/02/2011  . Breast cancer ([place]) 03/24/2011  . Cough 03/24/2011  . Shortness of breath 03/24/2011    Past Surgical History:  Procedure Laterality Date  . BREAST LUMPECTOMY  08-2009   left   . EYE SURGERY Bilateral    laser surgery - glaucoma  . eyelid surgery Bilateral    for glaucoma  . NOSE SURGERY  12-2006  . OOPHORECTOMY  2001  . SINUS ENDO W/FUSION Bilateral 02/16/2013   Procedure: ENDOSCOPIC SINUS SURGERY WITH FUSION NAVIGATION;  Surgeon: Jerrell Belfast, MD;  Location: [facility];  Service: ENT;  Laterality: Bilateral;    OB History    No data available  Home Medications    Prior to Admission medications   Medication Sig Start Date End Date Taking? Authorizing Provider  acetaminophen (TYLENOL) 500 MG tablet Take 1,000 mg by mouth every 6 (six) hours as needed for pain. Reported on 05/15/2015    Historical Provider, MD  ALPRAZolam Duanne Moron) 0.5 MG tablet Take by mouth. 09/20/15   Historical Provider, MD  Beclomethasone Dipropionate (QNASL) 80 MCG/ACT AERS Place 1 spray into the nose daily. Reported on 05/15/2015    Historical Provider, MD  brinzolamide (AZOPT) 1 % ophthalmic suspension Place 1 drop into both eyes 2 (two) times daily.      Historical Provider, MD  cetirizine (ZYRTEC) 10 MG tablet  Take 1 tablet (10 mg total) by mouth daily. 06/07/12   Deneise Lever, MD  cholecalciferol (VITAMIN D) 400 UNITS TABS Take 400 Units by mouth daily. Reported on 05/15/2015    Historical Provider, MD  dexlansoprazole (DEXILANT) 60 MG capsule Take 1 capsule (60 mg total) by mouth daily. 07/24/15   Adelina Mings, MD  EPINEPHrine (EPIPEN 2-PAK) 0.3 mg/0.3 mL IJ SOAJ injection Inject 0.3 mLs (0.3 mg total) into the muscle once. 03/26/15   Jiles Prows, MD  escitalopram (LEXAPRO) 20 MG tablet Take 20 mg by mouth daily.    Historical Provider, MD  gabapentin (NEURONTIN) 300 MG capsule Take 1 capsule (300 mg total) by mouth at bedtime. 10/22/14   Chauncey Cruel, MD  latanoprost (XALATAN) 0.005 % ophthalmic solution Place 1 drop into both eyes at bedtime.      Historical Provider, MD  mometasone-formoterol (DULERA) 200-5 MCG/ACT AERO Inhale 2 puffs into the lungs 2 (two) times daily. Patient taking differently: Inhale 2 puffs into the lungs as needed.  02/12/16   Jiles Prows, MD  montelukast (SINGULAIR) 10 MG tablet Take 1 tablet (10 mg total) by mouth at bedtime. 06/25/16   Jiles Prows, MD  moxifloxacin (AVELOX) 400 MG tablet Take 1 tablet (400 mg total) by mouth daily at 8 pm. 07/17/16   Shaylar Charmian Muff, MD  omalizumab Arvid Right) 150 MG injection Inject 300 mg into the skin every 28 (twenty-eight) days. 10/21/15   Jiles Prows, MD  ranitidine (ZANTAC) 300 MG tablet Take 300 mg by mouth at bedtime.    Historical Provider, MD    Family History Family History  Problem Relation Age of Onset  . Breast cancer Mother   . Emphysema Father   . Heart disease Father   . Asthma Brother     Social History Social History  Substance Use Topics  . Smoking status: Never Smoker  . Smokeless tobacco: Never Used  . Alcohol use Yes     Comment: occ     Allergies   Amoxicillin-pot clavulanate; Doxycycline; Nitrofurantoin macrocrystal; and Pregabalin   Review of Systems Review of Systems    Constitutional: Negative for chills and fever.  HENT: Positive for congestion and sinus pressure. Negative for hearing loss, sore throat and tinnitus.   Eyes: Positive for photophobia and visual disturbance. Negative for pain.  Respiratory: Negative for shortness of breath.   Cardiovascular: Negative for chest pain.  Gastrointestinal: Negative for abdominal pain, blood in stool, diarrhea, nausea and vomiting.  Genitourinary: Negative for dysuria and hematuria.  Musculoskeletal: Negative for back pain and neck pain.  Neurological: Positive for dizziness, light-headedness and headaches. Negative for syncope, weakness and numbness.  Psychiatric/Behavioral: Negative for confusion.     Physical Exam Updated Vital Signs BP 139/80   Pulse 74  Temp 98.4 F (36.9 C)   Resp 17   Ht 5\' 3"  (1.6 m)   Wt 74.4 kg   SpO2 98%   BMI 29.05 kg/m   Physical Exam  Constitutional: She is oriented to person, place, and time. She appears well-developed and well-nourished. No distress.  HENT:  Head: Normocephalic and atraumatic.  Right Ear: External ear normal.  Left Ear: External ear normal.  Nose: Nose normal.  Mouth/Throat: Oropharynx is clear and moist. No oropharyngeal exudate.  Maxillary and frontal sinuses. Nasal septum midline with pink mucosa and clear drainage.  Eyes: Conjunctivae and EOM are normal. Pupils are equal, round, and reactive to light. Right eye exhibits no discharge. Left eye exhibits no discharge. No scleral icterus.  Neck: Normal range of motion. Neck supple. No JVD present. No tracheal deviation present.  Cardiovascular: Normal rate, regular rhythm, normal heart sounds and intact distal pulses.   2+ radial and DP/PT pulses bl, negative Homan's bl   Pulmonary/Chest: Effort normal and breath sounds normal.  Abdominal: Soft. Bowel sounds are normal. She exhibits no distension. There is no tenderness.  Musculoskeletal: Normal range of motion.  No midline spine tenderness to  palpation. No paraspinal muscle spasm. Full ROM of BUE and BLE. 5/5 strength BUE and BLE with good grip strength.    Neurological: She is alert and oriented to person, place, and time. No cranial nerve deficit or sensory deficit.  Fluent speech, no facial droop, sensation intact globally, normal gait, and patient able to heel walk and toe walk without difficulty. No disdiadochokinesia, normal finger to nose, normal heel-to-shin. Negative Romberg. No pronator drift. Normal head impulse with no saccades. No nystagmus noted. Negative Dicks Hallpike maneuver. Negative skewed test.    Skin: Skin is warm and dry. Capillary refill takes less than 2 seconds. She is not diaphoretic.  Psychiatric: She has a normal mood and affect. Her behavior is normal.     ED Treatments / Results  Labs (all labs ordered are listed, but only abnormal results are displayed) Labs Reviewed  COMPREHENSIVE METABOLIC PANEL - Abnormal; Notable for the following:       Result Value   Potassium 3.3 (*)    Glucose, Bld 132 (*)    Creatinine, Ser 1.06 (*)    GFR calc non Af Amer 54 (*)    All other components within normal limits  CBG MONITORING, ED - Abnormal; Notable for the following:    Glucose-Capillary 116 (*)    All other components within normal limits  PROTIME-INR  APTT  CBC  DIFFERENTIAL  TROPONIN I  CBG MONITORING, ED    EKG  EKG Interpretation None       Radiology Ct Head Code Stroke W/o Cm  Result Date: 09/15/2016 CLINICAL DATA:  Code stroke. 66 y/o F; dizziness, blurred vision, and stumbling gait. Headaches since 11 a.m. EXAM: CT HEAD WITHOUT CONTRAST TECHNIQUE: Contiguous axial images were obtained from the base of the skull through the vertex without intravenous contrast. COMPARISON:  None available. FINDINGS: Brain: No evidence of acute infarction, hemorrhage, hydrocephalus, extra-axial collection or mass lesion/mass effect. Vascular: Mild calcific atherosclerosis of the cavernous and  paraclinoid internal carotid arteries. Skull: Normal. Negative for fracture or focal lesion. Sinuses/Orbits: Postsurgical changes related to ethmoidectomy and maxillary antrostomy are partially visualized. Trace posterior left mastoid air cell effusion with sclerosis compatible sequelae of chronic otomastoiditis. Mastoid air cells are otherwise normally aerated. Bilateral intra-ocular lens replacement. Other: None. ASPECTS [facility] Stroke Program Early CT Score) - Ganglionic  level infarction (caudate, lentiform nuclei, internal capsule, insula, M1-M3 cortex): 7 - Supraganglionic infarction (M4-M6 cortex): 3 Total score (0-10 with 10 being normal): 10 IMPRESSION: 1. No acute intracranial abnormality.  Unremarkable CT of the head. 2. ASPECTS is 10 These results were called by telephone at the time of interpretation on 09/15/2016 at 4:12 pm to Dr. Charlesetta Shanks , who verbally acknowledged these results. Electronically Signed   By: Kristine Garbe M.D.   On: 09/15/2016 16:13    Procedures Procedures (including critical care time)  Medications Ordered in ED Medications  LORazepam (ATIVAN) tablet 1 mg (not administered)  ibuprofen (ADVIL,MOTRIN) tablet 600 mg (not administered)  meclizine (ANTIVERT) tablet 25 mg (25 mg Oral Given 09/15/16 1644)  aspirin EC tablet 325 mg (325 mg Oral Given 09/15/16 1645)     Initial Impression / Assessment and Plan / ED Course  I have reviewed the triage vital signs and the nursing notes.  Pertinent labs & imaging results that were available during my care of the patient were reviewed by me and considered in my medical decision making (see chart for details).    Patient with sudden onset dizziness, blurry vision, and gait abnormality  Transferred from [facility].  No focal neurological deficits on examination. Patient afebrile, vital signs stable.  Initial CT scan negative.  Will obtain MRI head to rule out mass lesion, stroke, or other abnormality.    8:13 PM Signed out to oncoming provider, awaiting MRI. If MRI without abnormality, patient safe for discharge home with neurology follow-up.  If abnormal, patient will need to be brought into the hospital for further evaluation.  Final Clinical Impressions(s) / ED Diagnoses   Final diagnoses:  Blurred vision  Dizziness  Gait difficulty    New Prescriptions New Prescriptions   No medications on file     Renita Papa, PA-C 09/15/16 2014    Margette Fast, MD 09/16/16 1109

## 2016-09-15 NOTE — ED Provider Notes (Signed)
[facility] Provider Note   CSN: [id number] Arrival date & time: 09/15/16  1522     History   Chief Complaint Chief Complaint  Patient presents with  . Dizziness    HPI Catherine [name] is a 66 y.o. female.  HPI Patient [name] that 11 AM she was working at the computer. She states that both hands started to shake and she noticed that it was difficult to right. She [name] that she was able to write but due to the shaking it was more difficult. This however did not localize to her right hand. She states she also then started feel dizzy. It seemed like the room had movement to it. Vision was blurred or "seemed like things were moving" patient does not specifically endorse double vision. She [name] she found it difficult to walk and needed to hold onto something. She however denies that there was focal weakness or numbness of her lower extremities. Patient endorses some headache in association with these symptoms. Headache was more generalized. She does not endorse severe or or sudden onset of headache. No associated tinnitus. Patient does report she has had frequent problems with sinus infection. No recent fever or facial pain. No history of CVA. She does report having been diagnosed with vertigo once about 2 years ago but this seems different. Past Medical History:  Diagnosis Date  . Allergy history unknown   . Asthma   . Breast cancer ([place]) 03/24/2011  . Bronchitis   . Dizziness   . GERD (gastroesophageal reflux disease)   . Glaucoma   . Headache(784.0)   . Pneumonia    hx of  . PONV (postoperative nausea and vomiting)   . Shortness of breath    asthma  . Sinusitis     Patient Active Problem List   Diagnosis Date Noted  . Acute sinusitis 05/15/2015  . Asthma with acute exacerbation 05/15/2015  . Vaginal candidiasis 05/15/2015  . History of breast cancer 10/16/2013  . Sinusitis, chronic 02/16/2013    Class: Chronic  . Asthmatic bronchitis with exacerbation  12/08/2012  . Chronic maxillary sinusitis 06/18/2012  . Allergic rhinitis due to other allergen 05/02/2011  . Breast cancer ([place]) 03/24/2011  . Cough 03/24/2011  . Shortness of breath 03/24/2011    Past Surgical History:  Procedure Laterality Date  . BREAST LUMPECTOMY  08-2009   left   . EYE SURGERY Bilateral    laser surgery - glaucoma  . eyelid surgery Bilateral    for glaucoma  . NOSE SURGERY  12-2006  . OOPHORECTOMY  2001  . SINUS ENDO W/FUSION Bilateral 02/16/2013   Procedure: ENDOSCOPIC SINUS SURGERY WITH FUSION NAVIGATION;  Surgeon: Jerrell Belfast, MD;  Location: [facility];  Service: ENT;  Laterality: Bilateral;    OB History    No data available       Home Medications    Prior to Admission medications   Medication Sig Start Date End Date Taking? Authorizing Provider  acetaminophen (TYLENOL) 500 MG tablet Take 1,000 mg by mouth every 6 (six) hours as needed for pain. Reported on 05/15/2015    Historical Provider, MD  ALPRAZolam Duanne Moron) 0.5 MG tablet Take by mouth. 09/20/15   Historical Provider, MD  Beclomethasone Dipropionate (QNASL) 80 MCG/ACT AERS Place 1 spray into the nose daily. Reported on 05/15/2015    Historical Provider, MD  brinzolamide (AZOPT) 1 % ophthalmic suspension Place 1 drop into both eyes 2 (two) times daily.      Historical Provider, MD  cetirizine (ZYRTEC) 10 MG tablet Take 1 tablet (10 mg total) by mouth daily. 06/07/12   Deneise Lever, MD  cholecalciferol (VITAMIN D) 400 UNITS TABS Take 400 Units by mouth daily. Reported on 05/15/2015    Historical Provider, MD  dexlansoprazole (DEXILANT) 60 MG capsule Take 1 capsule (60 mg total) by mouth daily. 07/24/15   Adelina Mings, MD  EPINEPHrine (EPIPEN 2-PAK) 0.3 mg/0.3 mL IJ SOAJ injection Inject 0.3 mLs (0.3 mg total) into the muscle once. 03/26/15   Jiles Prows, MD  escitalopram (LEXAPRO) 20 MG tablet Take 20 mg by mouth daily.    Historical Provider, MD  gabapentin (NEURONTIN) 300 MG capsule Take 1  capsule (300 mg total) by mouth at bedtime. 10/22/14   Chauncey Cruel, MD  latanoprost (XALATAN) 0.005 % ophthalmic solution Place 1 drop into both eyes at bedtime.      Historical Provider, MD  mometasone-formoterol (DULERA) 200-5 MCG/ACT AERO Inhale 2 puffs into the lungs 2 (two) times daily. Patient taking differently: Inhale 2 puffs into the lungs as needed.  02/12/16   Jiles Prows, MD  montelukast (SINGULAIR) 10 MG tablet Take 1 tablet (10 mg total) by mouth at bedtime. 06/25/16   Jiles Prows, MD  moxifloxacin (AVELOX) 400 MG tablet Take 1 tablet (400 mg total) by mouth daily at 8 pm. 07/17/16   Shaylar Charmian Muff, MD  omalizumab Arvid Right) 150 MG injection Inject 300 mg into the skin every 28 (twenty-eight) days. 10/21/15   Jiles Prows, MD  ranitidine (ZANTAC) 300 MG tablet Take 300 mg by mouth at bedtime.    Historical Provider, MD    Family History Family History  Problem Relation Age of Onset  . Breast cancer Mother   . Emphysema Father   . Heart disease Father   . Asthma Brother     Social History Social History  Substance Use Topics  . Smoking status: Never Smoker  . Smokeless tobacco: Never Used  . Alcohol use Yes     Comment: occ     Allergies   Amoxicillin-pot clavulanate; Doxycycline; Nitrofurantoin macrocrystal; and Pregabalin   Review of Systems Review of Systems 10 Systems reviewed and are negative for acute change except as noted in the HPI.   Physical Exam Updated Vital Signs BP (!) 166/94 (BP Location: Right Arm)   Pulse 100   Temp 98.2 F (36.8 C) (Oral)   Resp 16   Ht 5\' 3"  (1.6 m)   Wt 164 lb (74.4 kg)   SpO2 97%   BMI 29.05 kg/m   Physical Exam  Constitutional: She is oriented to person, place, and time. She appears well-developed and well-nourished. No distress.  HENT:  Head: Normocephalic and atraumatic.  Right Ear: External ear normal.  Left Ear: External ear normal.  Nose: Nose normal.  Mouth/Throat: Oropharynx is clear and  moist.  Bilateral TMs normal.  Eyes: Conjunctivae and EOM are normal. Pupils are equal, round, and reactive to light.  Neck: Neck supple.  Cardiovascular: Normal rate and regular rhythm.   No murmur heard. Pulmonary/Chest: Effort normal and breath sounds normal. No respiratory distress.  Abdominal: Soft. There is no tenderness.  Musculoskeletal: She exhibits no edema or tenderness.  Neurological: She is alert and oriented to person, place, and time. No cranial nerve deficit. She exhibits normal muscle tone. Coordination normal.  Normal cognitive function. Normal finger-nose examination. Normal heel shin examination.  Skin: Skin is warm and dry.  Psychiatric: She has a normal  mood and affect.  Nursing note and vitals reviewed.    ED Treatments / Results  Labs (all labs ordered are listed, but only abnormal results are displayed) Labs Reviewed  COMPREHENSIVE METABOLIC PANEL - Abnormal; Notable for the following:       Result Value   Potassium 3.3 (*)    Glucose, Bld 132 (*)    Creatinine, Ser 1.06 (*)    GFR calc non Af Amer 54 (*)    All other components within normal limits  CBG MONITORING, ED - Abnormal; Notable for the following:    Glucose-Capillary 116 (*)    All other components within normal limits  PROTIME-INR  APTT  CBC  DIFFERENTIAL  TROPONIN I  CBG MONITORING, ED    EKG  EKG Interpretation None       Radiology Ct Head Code Stroke W/o Cm  Result Date: 09/15/2016 CLINICAL DATA:  Code stroke. 66 y/o F; dizziness, blurred vision, and stumbling gait. Headaches since 11 a.m. EXAM: CT HEAD WITHOUT CONTRAST TECHNIQUE: Contiguous axial images were obtained from the base of the skull through the vertex without intravenous contrast. COMPARISON:  None available. FINDINGS: Brain: No evidence of acute infarction, hemorrhage, hydrocephalus, extra-axial collection or mass lesion/mass effect. Vascular: Mild calcific atherosclerosis of the cavernous and paraclinoid internal  carotid arteries. Skull: Normal. Negative for fracture or focal lesion. Sinuses/Orbits: Postsurgical changes related to ethmoidectomy and maxillary antrostomy are partially visualized. Trace posterior left mastoid air cell effusion with sclerosis compatible sequelae of chronic otomastoiditis. Mastoid air cells are otherwise normally aerated. Bilateral intra-ocular lens replacement. Other: None. ASPECTS [facility] Stroke Program Early CT Score) - Ganglionic level infarction (caudate, lentiform nuclei, internal capsule, insula, M1-M3 cortex): 7 - Supraganglionic infarction (M4-M6 cortex): 3 Total score (0-10 with 10 being normal): 10 IMPRESSION: 1. No acute intracranial abnormality.  Unremarkable CT of the head. 2. ASPECTS is 10 These results were called by telephone at the time of interpretation on 09/15/2016 at 4:12 pm to Dr. Charlesetta Shanks , who verbally acknowledged these results. Electronically Signed   By: Kristine Garbe M.D.   On: 09/15/2016 16:13    Procedures Procedures (including critical care time)  Medications Ordered in ED Medications  meclizine (ANTIVERT) tablet 25 mg (not administered)     Initial Impression / Assessment and Plan / ED Course  I have reviewed the triage vital signs and the nursing notes.  Pertinent labs & imaging results that were available during my care of the patient were reviewed by me and considered in my medical decision making (see chart for details).    Consult: Patient's case is reviewed with EDP [facility] for accepting transfer to [facility] emergency department to continue diagnostic evaluation and neurology consultation. Consult:(16:35) [facility] for neurology advised to call for update after MRI complete. Final Clinical Impressions(s) / ED Diagnoses   Final diagnoses:  Blurred vision  Dizziness  Gait difficulty  Patient presents with onset of symptoms at 11 AM it included difficulty writing due to shaking of her hands. This did involve both hands.  She also identified blurring of vision but not double vision. She describes things seeming to move and not be able to focus directly on them. She also identified difficulty with gait but not focal weakness. Patient does have prior history of vertigo but not of similar quality. At this time, patient does not have focal neurologic deficit thus code stroke is not initiated. There is a vertiginous quality to her symptoms however this is not typical for what  she has experienced previously. At this time, plan will be to proceed with consultation to neurology and MRI. Currently, patient remains stable without localizing dysfunction, she will be transferred to [facility] for MRI and neurology consultation.  New Prescriptions New Prescriptions   No medications on file     Charlesetta Shanks, MD 09/15/16 [phone]

## 2016-09-15 NOTE — ED Notes (Signed)
Patient transported to CT 

## 2016-09-15 NOTE — ED Triage Notes (Signed)
c/o dizziness, blurred vision, stumbling gait, HA since 11am-NAD-steady gait

## 2016-09-15 NOTE — ED Notes (Signed)
ED Provider at bedside. 

## 2016-09-15 NOTE — ED Notes (Signed)
Assisted pt to and from bathroom. Pt able to ambulate with only standby assist, and reported no onset of dizziness, weakness or difficulty ambulating.

## 2016-09-15 NOTE — ED Notes (Signed)
Pt on monitor 

## 2016-09-15 NOTE — ED Notes (Signed)
Report to [facility], RN at [facility] ED.

## 2016-09-15 NOTE — Consult Note (Signed)
Referring Physician: Dr. Laverta Baltimore    Chief Complaint: Acute onset bilateral hand shaking, difficulty writing, dizziness, lightheadedness, oscillopsia and difficulty walking.  HPI: [name] is an 66 y.o. female who presented to [facility] ED with a chief complaint of acute onset bilateral hand shaking (right worse than left) and difficulty writing, followed by dizziness, lightheadedness, oscillopsia and difficulty walking. Symptoms began at 11 AM while working at her computer. Her dizziness was described as a sensation of things moving in front of her. There was an associated generalized 5/10 headache, which developed later, was not severe and not sudden in onset. The headache was frontal and also involved her posterior neck. Her headache was associated with blurry vision and photophobia. Her symptoms persisted for several hours before presenting to [facility].   At OSH ED, she denied tinnitus, fever or weakness/numbness of the lower extremities. No history of stroke. She was previously diagnosed with vertigo 2 years ago but stated at OSH ED that the current episode felt different. Meclizine given prior to transfer was felt by the patient to have mitigated her symptoms.   No focal neurological deficit was present on exam at the [facility] ED. Her head CT was negative. She was transported to [facility] for Neurology consultation and MRI brain.   Her PMHx includes breast cancer s/p left lumpectomy, glaucoma s/p laser eye surgery, headache and recurrent sinusitis s/p endoscopic sinus surgery.   MRI brain at [facility] reveals no acute intracranial process. Minimal cerebral white matter changes for patient age are noted, most likely related to chronic microvascular ischemic disease.  Past Medical History:  Diagnosis Date  . Allergy history unknown   . Asthma   . Breast cancer ([place]) 03/24/2011  . Bronchitis   . Dizziness   . GERD (gastroesophageal reflux disease)   . Glaucoma   . Headache(784.0)   . Pneumonia     hx of  . PONV (postoperative nausea and vomiting)   . Shortness of breath    asthma  . Sinusitis     Past Surgical History:  Procedure Laterality Date  . BREAST LUMPECTOMY  08-2009   left   . EYE SURGERY Bilateral    laser surgery - glaucoma  . eyelid surgery Bilateral    for glaucoma  . NOSE SURGERY  12-2006  . OOPHORECTOMY  2001  . SINUS ENDO W/FUSION Bilateral 02/16/2013   Procedure: ENDOSCOPIC SINUS SURGERY WITH FUSION NAVIGATION;  Surgeon: Jerrell Belfast, MD;  Location: [facility] OR;  Service: ENT;  Laterality: Bilateral;    Family History  Problem Relation Age of Onset  . Breast cancer Mother   . Emphysema Father   . Heart disease Father   . Asthma Brother    Social History:  reports that she has never smoked. She has never used smokeless tobacco. She reports that she drinks alcohol. She reports that she does not use drugs.  Allergies:  Allergies  Allergen Reactions  . Amoxicillin-Pot Clavulanate Nausea And Vomiting  . Doxycycline Nausea And Vomiting  . Nitrofurantoin Macrocrystal Other (See Comments)    Edema Edema  . Pregabalin Other (See Comments)    Blurry vision Blurry vision    Medications: Prior to Admission:  Current Facility-Administered Medications for the 09/15/16 encounter [facility] Encounter)  Medication  . omalizumab Arvid Right) injection 300 mg  . predniSONE (DELTASONE) tablet 10 mg   Current Meds  Medication Sig  . acetaminophen (TYLENOL) 500 MG tablet Take 1,000 mg by mouth every 6 (six) hours as needed for  pain. Reported on 05/15/2015  . ALPRAZolam (XANAX) 0.5 MG tablet Take 0.5 mg by mouth at bedtime as needed for sleep.   . Beclomethasone Dipropionate (QNASL) 80 MCG/ACT AERS Place 1 spray into the nose daily as needed (allergies). Reported on 05/15/2015  . brimonidine (ALPHAGAN) 0.2 % ophthalmic solution Place 1 drop into both eyes every morning.  . cetirizine (ZYRTEC) 10 MG tablet Take 1 tablet (10 mg total) by mouth daily. (Patient taking  differently: Take 10 mg by mouth daily as needed for allergies. )  . dexlansoprazole (DEXILANT) 60 MG capsule Take 1 capsule (60 mg total) by mouth daily.  Marland Kitchen EPINEPHrine (EPIPEN 2-PAK) 0.3 mg/0.3 mL IJ SOAJ injection Inject 0.3 mLs (0.3 mg total) into the muscle once. (Patient taking differently: Inject 0.3 mg into the muscle daily as needed (allergic reaction). )  . escitalopram (LEXAPRO) 20 MG tablet Take 20 mg by mouth daily.  Marland Kitchen gabapentin (NEURONTIN) 300 MG capsule Take 1 capsule (300 mg total) by mouth at bedtime.  Marland Kitchen latanoprost (XALATAN) 0.005 % ophthalmic solution Place 1 drop into both eyes every morning.   . mometasone-formoterol (DULERA) 200-5 MCG/ACT AERO Inhale 2 puffs into the lungs 2 (two) times daily. (Patient taking differently: Inhale 2 puffs into the lungs daily as needed for wheezing or shortness of breath. )  . montelukast (SINGULAIR) 10 MG tablet Take 1 tablet (10 mg total) by mouth at bedtime.  Marland Kitchen omalizumab (XOLAIR) 150 MG injection Inject 300 mg into the skin every 28 (twenty-eight) days.  . ranitidine (ZANTAC) 300 MG tablet Take 300 mg by mouth at bedtime.   ROS: No CP, SOB, N/V, fevers or chills. Other ROS as per HPI.   Physical Examination: Blood pressure (!) 151/81, pulse 72, temperature 98.4 F (36.9 C), resp. rate 15, height '5\' 3"'  (1.6 m), weight 74.4 kg (164 lb), SpO2 97 %.  HEENT: [place]/AT Lungs: Respirations unlabored Ext: No edema  Neurologic Examination: Mental Status: Alert, oriented, thought content appropriate.  Speech fluent without evidence of aphasia.  Able to follow all commands without difficulty. Cranial Nerves: II:  Visual fields intact, PERRL III,IV, VI: Mild right sided ptosis, EOMI without nystagmus V,VII: Baseline asymmetry of perioral musculature with normal contraction during smiling and grimacing bilaterally. Facial temp sensation normal bilaterally VIII: hearing intact to conversation IX,X: palate rises symmetrically XI: with bilateral  shoulder shrug, there is mild lag on right  XII: midline tongue extension  Motor: Right : Upper extremity   5/5    Left:     Upper extremity   5/5  Lower extremity   5/5     Lower extremity   5/5 Normal tone throughout; no atrophy noted Sensory: Temperature and light touch intact x 4. No extinction.  Deep Tendon Reflexes:  3+ bilateral upper and lower extremities.   Plantars: Right: downgoing   Left: downgoing Cerebellar: No ataxia with FNF or heel-shin bilaterally  Gait: Deferred  Results for orders placed or performed during the hospital encounter of 09/15/16 (from the past 48 hour(s))  POC CBG, ED     Status: Abnormal   Collection Time: 09/15/16  3:40 PM  Result Value Ref Range   Glucose-Capillary 116 (H) 65 - 99 mg/dL  Protime-INR     Status: None   Collection Time: 09/15/16  3:40 PM  Result Value Ref Range   Prothrombin Time 13.0 11.4 - 15.2 seconds   INR 0.98   APTT     Status: None   Collection Time: 09/15/16  3:40  PM  Result Value Ref Range   aPTT 28 24 - 36 seconds  CBC     Status: None   Collection Time: 09/15/16  3:40 PM  Result Value Ref Range   WBC 7.1 4.0 - 10.5 K/uL   RBC 4.80 3.87 - 5.11 MIL/uL   Hemoglobin 12.5 12.0 - 15.0 g/dL   HCT 38.8 36.0 - 46.0 %   MCV 80.8 78.0 - 100.0 fL   MCH 26.0 26.0 - 34.0 pg   MCHC 32.2 30.0 - 36.0 g/dL   RDW 14.5 11.5 - 15.5 %   Platelets 311 150 - 400 K/uL  Differential     Status: None   Collection Time: 09/15/16  3:40 PM  Result Value Ref Range   Neutrophils Relative % 64 %   Neutro Abs 4.6 1.7 - 7.7 K/uL   Lymphocytes Relative 18 %   Lymphs Abs 1.3 0.7 - 4.0 K/uL   Monocytes Relative 10 %   Monocytes Absolute 0.7 0.1 - 1.0 K/uL   Eosinophils Relative 7 %   Eosinophils Absolute 0.5 0.0 - 0.7 K/uL   Basophils Relative 1 %   Basophils Absolute 0.0 0.0 - 0.1 K/uL  Comprehensive metabolic panel     Status: Abnormal   Collection Time: 09/15/16  3:40 PM  Result Value Ref Range   Sodium 137 135 - 145 mmol/L    Potassium 3.3 (L) 3.5 - 5.1 mmol/L   Chloride 104 101 - 111 mmol/L   CO2 25 22 - 32 mmol/L   Glucose, Bld 132 (H) 65 - 99 mg/dL   BUN 19 6 - 20 mg/dL   Creatinine, Ser 1.06 (H) 0.44 - 1.00 mg/dL   Calcium 9.0 8.9 - 10.3 mg/dL   Total Protein 7.4 6.5 - 8.1 g/dL   Albumin 4.3 3.5 - 5.0 g/dL   AST 25 15 - 41 U/L   ALT 19 14 - 54 U/L   Alkaline Phosphatase 124 38 - 126 U/L   Total Bilirubin 0.3 0.3 - 1.2 mg/dL   GFR calc non Af Amer 54 (L) >60 mL/min   GFR calc Af Amer >60 >60 mL/min    Comment: (NOTE) The eGFR has been calculated using the CKD EPI equation. This calculation has not been validated in all clinical situations. eGFR's persistently <60 mL/min signify possible Chronic Kidney Disease.    Anion gap 8 5 - 15  Troponin I     Status: None   Collection Time: 09/15/16  3:40 PM  Result Value Ref Range   Troponin I <0.03 <0.03 ng/mL   Ct Head Code Stroke W/o Cm  Result Date: 09/15/2016 CLINICAL DATA:  Code stroke. 66 y/o F; dizziness, blurred vision, and stumbling gait. Headaches since 11 a.m. EXAM: CT HEAD WITHOUT CONTRAST TECHNIQUE: Contiguous axial images were obtained from the base of the skull through the vertex without intravenous contrast. COMPARISON:  None available. FINDINGS: Brain: No evidence of acute infarction, hemorrhage, hydrocephalus, extra-axial collection or mass lesion/mass effect. Vascular: Mild calcific atherosclerosis of the cavernous and paraclinoid internal carotid arteries. Skull: Normal. Negative for fracture or focal lesion. Sinuses/Orbits: Postsurgical changes related to ethmoidectomy and maxillary antrostomy are partially visualized. Trace posterior left mastoid air cell effusion with sclerosis compatible sequelae of chronic otomastoiditis. Mastoid air cells are otherwise normally aerated. Bilateral intra-ocular lens replacement. Other: None. ASPECTS [facility] Stroke Program Early CT Score) - Ganglionic level infarction (caudate, lentiform nuclei, internal  capsule, insula, M1-M3 cortex): 7 - Supraganglionic infarction (M4-M6 cortex): 3 Total score (  0-10 with 10 being normal): 10 IMPRESSION: 1. No acute intracranial abnormality.  Unremarkable CT of the head. 2. ASPECTS is 10 These results were called by telephone at the time of interpretation on 09/15/2016 at 4:12 pm to Dr. Charlesetta Shanks , who verbally acknowledged these results. Electronically Signed   By: Kristine Garbe M.D.   On: 09/15/2016 16:13    Assessment: 66 y.o. female with acute onset bilateral hand shaking, difficulty writing, dizziness, lightheadedness, oscillopsia and difficulty walking 1. Symptoms now resolved. Neurological exam reveals mild right ptosis of uncertain chronicity, without associated miosis. Mild lag on right with shoulder shrug. Neurological exam otherwise unremarkable.  2. MRI brain negative except for mild chronic small vessel ischemic changes.  3. Significant psychosocial stressors at work. 4. Overall clinical presentation suggestive of stress-related symptoms, such as may be seen with conversion disorder. However, structural predisposition to stroke/TIA will need to be ruled out with further imaging studies.   Plan: 1. MRA of the brain without contrast 2. Echocardiogram 3. Carotid dopplers 4. Telemetry monitoring 5. Start ASA 81 mg po qd 6. Permissive HTN x 24 hours 7. Frequent neuro checks 8. PT consult, OT consult, Speech consult 9. HgbA1c, fasting lipid panel  '@Electronically'  signed: Dr. Kerney Elbe  09/15/2016, 11:14 PM

## 2016-09-15 NOTE — ED Notes (Signed)
Patient transported to MRI 

## 2016-09-16 ENCOUNTER — Observation Stay (HOSPITAL_BASED_OUTPATIENT_CLINIC_OR_DEPARTMENT_OTHER)

## 2016-09-16 ENCOUNTER — Observation Stay (HOSPITAL_COMMUNITY)

## 2016-09-16 ENCOUNTER — Encounter (HOSPITAL_COMMUNITY): Payer: Self-pay | Admitting: Internal Medicine

## 2016-09-16 DIAGNOSIS — E785 Hyperlipidemia, unspecified: Secondary | ICD-10-CM | POA: Diagnosis not present

## 2016-09-16 DIAGNOSIS — G458 Other transient cerebral ischemic attacks and related syndromes: Secondary | ICD-10-CM | POA: Diagnosis not present

## 2016-09-16 DIAGNOSIS — R42 Dizziness and giddiness: Secondary | ICD-10-CM

## 2016-09-16 DIAGNOSIS — G459 Transient cerebral ischemic attack, unspecified: Secondary | ICD-10-CM

## 2016-09-16 LAB — ECHOCARDIOGRAM COMPLETE
AVLVOTPG: 4 mmHg
EWDT: 166 ms
FS: 35 % (ref 28–44)
Height: 63 in
IVS/LV PW RATIO, ED: 1
LA diam end sys: 30 mm
LA diam index: 1.64 cm/m2
LA vol A4C: 20.3 ml
LA vol index: 13 mL/m2
LA vol: 23.8 mL
LASIZE: 30 mm
LDCA: 2.54 cm2
LV TDI E'LATERAL: 9.25
LV TDI E'MEDIAL: 7.83
LV e' LATERAL: 9.25 cm/s
LVOT VTI: 21 cm
LVOTD: 18 mm
LVOTPV: 103 cm/s
LVOTSV: 53 mL
MV Dec: 166
MV pk E vel: 0.8 m/s
PW: 9 mm — AB (ref 0.6–1.1)
RV LATERAL S' VELOCITY: 13.2 cm/s
TAPSE: 26.5 mm
Weight: 2592 oz

## 2016-09-16 LAB — CBC
HCT: 36.3 % (ref 36.0–46.0)
HEMOGLOBIN: 11.7 g/dL — AB (ref 12.0–15.0)
MCH: 26.1 pg (ref 26.0–34.0)
MCHC: 32.2 g/dL (ref 30.0–36.0)
MCV: 81 fL (ref 78.0–100.0)
PLATELETS: 251 10*3/uL (ref 150–400)
RBC: 4.48 MIL/uL (ref 3.87–5.11)
RDW: 14.6 % (ref 11.5–15.5)
WBC: 4.8 10*3/uL (ref 4.0–10.5)

## 2016-09-16 LAB — LIPID PANEL
CHOLESTEROL: 253 mg/dL — AB (ref 0–200)
HDL: 76 mg/dL (ref >40)
LDL CALC: 157 mg/dL — AB (ref 0–99)
TRIGLYCERIDES: 99 mg/dL (ref <150)
Total CHOL/HDL Ratio: 3.3 RATIO
VLDL: 20 mg/dL (ref 0–40)

## 2016-09-16 LAB — CREATININE, SERUM
CREATININE: 0.93 mg/dL (ref 0.44–1.00)
GFR calc Af Amer: >60 mL/min (ref >60)

## 2016-09-16 LAB — HIV ANTIBODY (ROUTINE TESTING W REFLEX): HIV Screen 4th Generation wRfx: NONREACTIVE

## 2016-09-16 MED ORDER — ENOXAPARIN SODIUM 40 MG/0.4ML ~~LOC~~ SOLN
40.0000 mg | SUBCUTANEOUS | Status: DC
  Administered 2016-09-16: 40 mg via SUBCUTANEOUS
  Filled 2016-09-16: qty 0.4

## 2016-09-16 MED ORDER — FLUTICASONE PROPIONATE 50 MCG/ACT NA SUSP
2.0000 | Freq: Every day | NASAL | Status: DC
  Filled 2016-09-16: qty 16

## 2016-09-16 MED ORDER — FAMOTIDINE 20 MG PO TABS
20.0000 mg | ORAL_TABLET | Freq: Every day | ORAL | Status: DC
  Administered 2016-09-16: 20 mg via ORAL
  Filled 2016-09-16: qty 1

## 2016-09-16 MED ORDER — LORATADINE 10 MG PO TABS
10.0000 mg | ORAL_TABLET | Freq: Every day | ORAL | Status: DC
  Administered 2016-09-16: 10 mg via ORAL
  Filled 2016-09-16: qty 1

## 2016-09-16 MED ORDER — ASPIRIN 300 MG RE SUPP: 300.0000 mg | Freq: Every day | RECTAL | Status: DC

## 2016-09-16 MED ORDER — LATANOPROST 0.005 % OP SOLN
1.0000 [drp] | Freq: Every day | OPHTHALMIC | Status: DC
  Filled 2016-09-16: qty 2.5

## 2016-09-16 MED ORDER — STROKE: EARLY STAGES OF RECOVERY BOOK
Freq: Once | Status: AC
  Administered 2016-09-16: 04:00:00
  Filled 2016-09-16: qty 1

## 2016-09-16 MED ORDER — ACETAMINOPHEN 650 MG RE SUPP: 650.0000 mg | RECTAL | Status: DC | PRN

## 2016-09-16 MED ORDER — MONTELUKAST SODIUM 10 MG PO TABS
10.0000 mg | ORAL_TABLET | Freq: Every day | ORAL | Status: DC
Start: 2016-09-16 — End: 2016-09-16

## 2016-09-16 MED ORDER — ASPIRIN 325 MG PO TABS
325.0000 mg | ORAL_TABLET | Freq: Every day | ORAL | Status: DC
  Administered 2016-09-16: 325 mg via ORAL
  Filled 2016-09-16: qty 1

## 2016-09-16 MED ORDER — ATORVASTATIN CALCIUM 20 MG PO TABS: 20.0000 mg | ORAL_TABLET | Freq: Every day | ORAL | 0 refills | Status: AC

## 2016-09-16 MED ORDER — BRIMONIDINE TARTRATE 0.2 % OP SOLN
1.0000 [drp] | Freq: Every day | OPHTHALMIC | Status: DC
  Filled 2016-09-16: qty 5

## 2016-09-16 MED ORDER — SODIUM CHLORIDE 0.9 % IV SOLN
INTRAVENOUS | Status: DC
  Administered 2016-09-16: 04:00:00 via INTRAVENOUS

## 2016-09-16 MED ORDER — ASPIRIN 81 MG PO TBEC: 81.0000 mg | DELAYED_RELEASE_TABLET | Freq: Every day | ORAL | 0 refills | Status: AC

## 2016-09-16 MED ORDER — ACETAMINOPHEN 160 MG/5ML PO SOLN: 650.0000 mg | ORAL | Status: DC | PRN

## 2016-09-16 MED ORDER — GABAPENTIN 300 MG PO CAPS: 300.0000 mg | ORAL_CAPSULE | Freq: Every day | ORAL | Status: DC

## 2016-09-16 MED ORDER — EPINEPHRINE 0.3 MG/0.3ML IJ SOAJ: 0.3000 mg | Freq: Every day | INTRAMUSCULAR | Status: DC | PRN

## 2016-09-16 MED ORDER — MECLIZINE HCL 12.5 MG PO TABS: 12.5000 mg | ORAL_TABLET | Freq: Three times a day (TID) | ORAL | 0 refills | Status: DC | PRN

## 2016-09-16 MED ORDER — ASPIRIN EC 81 MG PO TBEC: 81.0000 mg | DELAYED_RELEASE_TABLET | Freq: Every day | ORAL | Status: DC

## 2016-09-16 MED ORDER — ACETAMINOPHEN 325 MG PO TABS: 650.0000 mg | ORAL_TABLET | ORAL | Status: DC | PRN

## 2016-09-16 MED ORDER — ATORVASTATIN CALCIUM 10 MG PO TABS: 20.0000 mg | ORAL_TABLET | Freq: Every day | ORAL | Status: DC

## 2016-09-16 MED ORDER — ESCITALOPRAM OXALATE 10 MG PO TABS
20.0000 mg | ORAL_TABLET | Freq: Every day | ORAL | Status: DC
  Administered 2016-09-16: 20 mg via ORAL
  Filled 2016-09-16: qty 2

## 2016-09-16 MED ORDER — PANTOPRAZOLE SODIUM 40 MG PO TBEC
40.0000 mg | DELAYED_RELEASE_TABLET | Freq: Every day | ORAL | Status: DC
  Administered 2016-09-16: 40 mg via ORAL
  Filled 2016-09-16: qty 1

## 2016-09-16 MED ORDER — ALPRAZOLAM 0.5 MG PO TABS: 0.5000 mg | ORAL_TABLET | Freq: Every evening | ORAL | Status: DC | PRN

## 2016-09-16 NOTE — Evaluation (Signed)
Physical Therapy Evaluation Patient Details Name: [name] MRN: [id number] DOB: 11/13/1950 Today's Date: 09/16/2016   History of Present Illness  Pt is a 66 y.o. female with history of bronchial asthma, breast cancer in remission presents to the ER with complaints of dizziness and hand shaking, Patient had a sensation of things spinning around, and difficulty walking. MRI with no acute findings.  Clinical Impression  Pt admitted with above diagnosis. Pt currently with functional limitations due to the deficits listed below (see PT Problem List). Pt able to ambulate 150 ft without an assistive device and min guard assistance. Pt does reports generalized LE weakness while ambulating. Pt states that she does feel dizzy but it does not change with changes in positions.  Overall, pt will benefit from skilled PT to increase their independence and safety with mobility in anticipation of D/C to home.       Follow Up Recommendations Home health PT;Supervision - Intermittent    Equipment Recommendations  None recommended by PT    Recommendations for Other Services       Precautions / Restrictions Precautions Precautions: Fall Restrictions Weight Bearing Restrictions: No      Mobility  Bed Mobility Overal bed mobility: Needs Assistance Bed Mobility: Supine to Sit     Supine to sit: Supervision     General bed mobility comments: Pt sitting EOB with PT when OT entered the room  Transfers Overall transfer level: Needs assistance Equipment used: None Transfers: Sit to/from Stand Sit to Stand: Min guard         General transfer comment: guard for safety  Ambulation/Gait Ambulation/Gait assistance: Min guard Ambulation Distance (Feet): 150 Feet Assistive device: None Gait Pattern/deviations: Step-through pattern;Decreased stride length Gait velocity: decreased   General Gait Details: mild instability but no loss of balance  Stairs            Wheelchair Mobility     Modified Rankin (Stroke Patients Only) Modified Rankin (Stroke Patients Only) Pre-Morbid Rankin Score: No symptoms Modified Rankin: Moderately severe disability     Balance Overall balance assessment: Needs assistance Sitting-balance support: No upper extremity supported;Feet supported Sitting balance-Leahy Scale: Good     Standing balance support: No upper extremity supported Standing balance-Leahy Scale: Fair Standing balance comment: able to perform ADL at sink with no LOB,                High Level Balance Comments: able to stand static without UE support eyes open/closed (minimal sway); able to reach in all quadrants of motion shifting center of gravity with no LOB eyes open             Pertinent Vitals/Pain Pain Assessment: 0-10 Pain Score: 1  Pain Location: Sinus Pain Descriptors / Indicators: Discomfort Pain Intervention(s): Heat applied;Repositioned    Home Living Family/patient expects to be discharged to:: Private residence Living Arrangements: Spouse/significant other Available Help at Discharge: Available 24 hours/day;Family Type of Home: House Home Access: Stairs to enter Entrance Stairs-Rails: Left Entrance Stairs-Number of Steps: 3 Home Layout: One level Home Equipment: None Additional Comments: spouse has medical problems, limited physical support, daughter works but able to assist as able.     Prior Function Level of Independence: Independent         Comments: driving, enjoys walking the dog     Hand Dominance   Dominant Hand: Right    Extremity/Trunk Assessment   Upper Extremity Assessment Upper Extremity Assessment: Overall WFL for tasks assessed;Generalized weakness (denies sensory differences, no  shaking; general 4/5 strength)    Lower Extremity Assessment Lower Extremity Assessment: Defer to PT evaluation    Cervical / Trunk Assessment Cervical / Trunk Assessment: Normal  Communication   Communication: No  difficulties  Cognition Arousal/Alertness: Awake/alert Behavior During Therapy: WFL for tasks assessed/performed Overall Cognitive Status: Within Functional Limits for tasks assessed                                        General Comments General comments (skin integrity, edema, etc.): Pt reports feeling mild dizziness. Pt denies any change in symptoms static or with movement.     Exercises     Assessment/Plan    PT Assessment Patient needs continued PT services  PT Problem List Decreased activity tolerance;Decreased balance;Decreased mobility       PT Treatment Interventions DME instruction;Gait training;Stair training;Functional mobility training;Therapeutic activities;Therapeutic exercise;Balance training;Neuromuscular re-education;Patient/family education    PT Goals (Current goals can be found in the Care Plan section)  Acute Rehab PT Goals Patient Stated Goal: get rid of dizziness PT Goal Formulation: With patient Time For Goal Achievement: 09/30/16 Potential to Achieve Goals: Good    Frequency Min 3X/week   Barriers to discharge        Co-evaluation               AM-PAC PT "6 Clicks" Daily Activity  Outcome Measure Difficulty turning over in bed (including adjusting bedclothes, sheets and blankets)?: A Little Difficulty moving from lying on back to sitting on the side of the bed? : A Little Difficulty sitting down on and standing up from a chair with arms (e.g., wheelchair, bedside commode, etc,.)?: A Little Help needed moving to and from a bed to chair (including a wheelchair)?: A Little Help needed walking in hospital room?: A Little Help needed climbing 3-5 steps with a railing? : A Little 6 Click Score: 18    End of Session Equipment Utilized During Treatment: Gait belt Activity Tolerance: Patient tolerated treatment well Patient left: in chair;with call bell/phone within reach Nurse Communication: Mobility status PT Visit  Diagnosis: Unsteadiness on feet (R26.81);Dizziness and giddiness (R42)    Time: 6060-0459 PT Time Calculation (min) (ACUTE ONLY): 27 min   Charges:   PT Evaluation $PT Eval Moderate Complexity: 1 Procedure PT Treatments $Gait Training: 8-22 mins   PT G Codes:   PT G-Codes **NOT FOR INPATIENT CLASS** Functional Assessment Tool Used: AM-PAC 6 Clicks Basic Mobility;Clinical judgement Functional Limitation: Mobility: Walking and moving around Mobility: Walking and Moving Around Current Status (X7741): At least 20 percent but less than 40 percent impaired, limited or restricted Mobility: Walking and Moving Around Goal Status [phone]): At least 1 percent but less than 20 percent impaired, limited or restricted    Cassell Clement, PT, CSCS Pager [phone] Office [phone]   Linard Millers 09/16/2016, 12:18 PM

## 2016-09-16 NOTE — Progress Notes (Signed)
*  PRELIMINARY RESULTS* Vascular Ultrasound Carotid Duplex (Doppler) has been completed.  Preliminary findings: Bilateral: No significant (1-39%) ICA stenosis. Antegrade vertebral flow.   Landry Mellow, RDMS, RVT  09/16/2016, 3:52 PM

## 2016-09-16 NOTE — ED Notes (Signed)
Neurologist at bedside. 

## 2016-09-16 NOTE — Progress Notes (Signed)
STROKE TEAM PROGRESS NOTE   SUBJECTIVE (INTERVAL HISTORY) She presented with transient episode of dizziness, objects moving around lasting for couple of hours and this improved almost back to baseline. She denies prior history of strokes TIAs seizures. She had an ongoing sinus infection and wonders if this may be related to it. She did feel better after she was given meclizine.   OBJECTIVE Temp:  [97.9 F (36.6 C)-98.4 F (36.9 C)] 97.9 F (36.6 C) (05/02 0215) Pulse Rate:  [68-100] 68 (05/02 0215) Cardiac Rhythm: Normal sinus rhythm;Heart block (05/02 0742) Resp:  [12-20] 18 (05/02 0215) BP: (122-168)/(68-94) 139/70 (05/02 0215) SpO2:  [92 %-99 %] 97 % (05/02 0215) Weight:  [73.5 kg (162 lb)-74.4 kg (164 lb)] 73.5 kg (162 lb) (05/02 0215)  CBC:  Recent Labs Lab 09/15/16 1540 09/16/16 0709  WBC 7.1 4.8  NEUTROABS 4.6  --   HGB 12.5 11.7*  HCT 38.8 36.3  MCV 80.8 81.0  PLT 311 546    Basic Metabolic Panel:  Recent Labs Lab 09/15/16 1540 09/16/16 0709  NA 137  --   K 3.3*  --   CL 104  --   CO2 25  --   GLUCOSE 132*  --   BUN 19  --   CREATININE 1.06* 0.93  CALCIUM 9.0  --    HgbA1c: No results found for: HGBA1C  PHYSICAL EXAM Frail elderly caucasian lady not in distress. . Afebrile. Head is nontraumatic. Neck is supple without bruit.    Cardiac exam no murmur or gallop. Lungs are clear to auscultation. Distal pulses are well felt. Neurological Exam ;  Awake  Alert oriented x 3. Normal speech and language.Fundi not visualizedeye movements full without nystagmus.fundi were not visualized. Vision acuity and fields appear normal. Hearing is normal. Palatal movements are normal. Face symmetric. Tongue midline. Normal strength, tone, reflexes and coordination. Normal sensation. Gait deferred.  ASSESSMENT/PLAN [name] is a 66 y.o. female with history of breast cancer s/p L lumpectomy, glaucoma s/p laser surgery, HA and recurrent sinusitis s/p endoscopic sinus  surgery presenting to [facility] with hand shaking/difficulty writing, gait disturbance and dizziness w/o focal neurologic deficit. She did not receive IV t-PA due to non-focal exam.   Possible vertebrobasilar TIA  Code Stroke CT no acute stroke. Aspects 10.    MRI  No acute stroke. Minimal small vessel disease.  MRA  Unremarkable   Carotid Doppler pending  2D Echo  pending  LDL 157  HgbA1c pending  Lovenox 40 mg sq daily for VTE prophylaxis  Diet Heart Room service appropriate? Yes; Fluid consistency: Thin  No antithrombotic prior to admission, now on aspirin 81 mg daily    Therapy recommendations:  pending   Disposition:  pending   Blood Pressure  Stable  Hyperlipidemia  Home meds:  No statin  LDL 157 goal  Add lipitor 20 mg  Continue statin at discharge  Other Stroke Risk Factors  ETOH use  UDS / ETOH level not performed   Overweight, Body mass index is 28.7 kg/m.  Hospital day # 0 I have personally examined this patient, reviewed notes, independently viewed imaging studies, participated in medical decision making and plan of care.ROS completed by me personally and pertinent positives fully documented  I have made any additions or clarifications directly to the above note. She presented with dizziness, lightheadedness, oscillopsia and difficulty walking and possibly a vertebrobasilar TIA and needs ongoing stroke evaluation and aggressive risk factor modification. Start aspirin 81 mg  And  lipitor 20 mg daily for stroke prevention. Greater than 50% time during this 35 minute visit was spent on counseling and coordination of care about stroke and TIA risk, evaluation, prevention and treatment plan and answering questions  Antony Contras, [place] Pager: [phone] 09/16/2016 2:23 PM   To contact Stroke Continuity provider, please refer to [email or web address] After hours, contact General Neurology

## 2016-09-16 NOTE — Progress Notes (Signed)
Patient admitted after midnight, please see H&P.  MRI negative for CVA.  TIA work up in place.  Patient has long term sinus issues and head feels "full".  Patient will need to see ENT as an outpatient.  Neurology to see and plan to d/c when work up complete  Eulogio Bear DO

## 2016-09-16 NOTE — Progress Notes (Signed)
Patient discharged home with family. D/C instructions given with prescriptions. IV removed. Patient questions asked and answered. Left unit via wheelchair with staff. Will transport home with family. Wendee Copp

## 2016-09-16 NOTE — Progress Notes (Signed)
Patient admitted from ED via stretcher to room 5C04. Alert and oriented x4. Ambulated to bed without difficulty Denies pain at this time. Oriented to room and call bell. Awaiting for MD for orders.

## 2016-09-16 NOTE — H&P (Signed)
History and Physical    [name]:[id number] DOB: [birth year] DOA: 09/15/2016  PCP: Nena Polio, NP  Patient coming from: Home.  Chief Complaint: Tremors and dizziness.  HPI: [name] is a 66 y.o. female with history of bronchial asthma, breast cancer in remission presents to the ER with complaints of dizziness and hand shaking. Patient's symptoms started around 11 AM while working on the computer. Persisted for around 2-3 hours and patient decided to come to the ER. Patient had a sensation of things spinning around. Denies any visual symptoms difficulty swallowing or speaking or any weakness of extremities. Patient also had difficulty walking.  ED Course: In the ER patient was given meclizine for language patient's symptoms improved. MRI of the brain was done which did not show anything acute. On-call neurologist Dr. Cheral Marker was consulted and at this time neurologist has recommended further workup for TIA.  Review of Systems: As per HPI, rest all negative.   Past Medical History:  Diagnosis Date  . Allergy history unknown   . Asthma   . Breast cancer ([place]) 03/24/2011  . Bronchitis   . Dizziness   . GERD (gastroesophageal reflux disease)   . Glaucoma   . Headache(784.0)   . Pneumonia    hx of  . PONV (postoperative nausea and vomiting)   . Shortness of breath    asthma  . Sinusitis     Past Surgical History:  Procedure Laterality Date  . BREAST LUMPECTOMY  08-2009   left   . EYE SURGERY Bilateral    laser surgery - glaucoma  . eyelid surgery Bilateral    for glaucoma  . NOSE SURGERY  12-2006  . OOPHORECTOMY  2001  . SINUS ENDO W/FUSION Bilateral 02/16/2013   Procedure: ENDOSCOPIC SINUS SURGERY WITH FUSION NAVIGATION;  Surgeon: Jerrell Belfast, MD;  Location: [place];  Service: ENT;  Laterality: Bilateral;     reports that she has never smoked. She has never used smokeless tobacco. She reports that she drinks alcohol. She reports that she does not use  drugs.  Allergies  Allergen Reactions  . Amoxicillin-Pot Clavulanate Nausea And Vomiting  . Doxycycline Nausea And Vomiting  . Nitrofurantoin Macrocrystal Other (See Comments)    Edema Edema  . Pregabalin Other (See Comments)    Blurry vision Blurry vision    Family History  Problem Relation Age of Onset  . Breast cancer Mother   . Emphysema Father   . Heart disease Father   . Asthma Brother     Prior to Admission medications   Medication Sig Start Date End Date Taking? Authorizing Provider  acetaminophen (TYLENOL) 500 MG tablet Take 1,000 mg by mouth every 6 (six) hours as needed for pain. Reported on 05/15/2015   Yes Historical Provider, MD  ALPRAZolam Duanne Moron) 0.5 MG tablet Take 0.5 mg by mouth at bedtime as needed for sleep.  09/20/15  Yes Historical Provider, MD  Beclomethasone Dipropionate (QNASL) 80 MCG/ACT AERS Place 1 spray into the nose daily as needed (allergies). Reported on 05/15/2015   Yes Historical Provider, MD  brimonidine (ALPHAGAN) 0.2 % ophthalmic solution Place 1 drop into both eyes every morning.   Yes Historical Provider, MD  cetirizine (ZYRTEC) 10 MG tablet Take 1 tablet (10 mg total) by mouth daily. Patient taking differently: Take 10 mg by mouth daily as needed for allergies.  06/07/12  Yes Deneise Lever, MD  dexlansoprazole (DEXILANT) 60 MG capsule Take 1 capsule (60 mg total) by mouth daily. 07/24/15  Yes Adelina Mings, MD  EPINEPHrine (EPIPEN 2-PAK) 0.3 mg/0.3 mL IJ SOAJ injection Inject 0.3 mLs (0.3 mg total) into the muscle once. Patient taking differently: Inject 0.3 mg into the muscle daily as needed (allergic reaction).  03/26/15  Yes Jiles Prows, MD  escitalopram (LEXAPRO) 20 MG tablet Take 20 mg by mouth daily.   Yes Historical Provider, MD  gabapentin (NEURONTIN) 300 MG capsule Take 1 capsule (300 mg total) by mouth at bedtime. 10/22/14  Yes Chauncey Cruel, MD  latanoprost (XALATAN) 0.005 % ophthalmic solution Place 1 drop into both eyes  every morning.    Yes Historical Provider, MD  mometasone-formoterol (DULERA) 200-5 MCG/ACT AERO Inhale 2 puffs into the lungs 2 (two) times daily. Patient taking differently: Inhale 2 puffs into the lungs daily as needed for wheezing or shortness of breath.  02/12/16  Yes Jiles Prows, MD  montelukast (SINGULAIR) 10 MG tablet Take 1 tablet (10 mg total) by mouth at bedtime. 06/25/16  Yes Jiles Prows, MD  omalizumab Arvid Right) 150 MG injection Inject 300 mg into the skin every 28 (twenty-eight) days. 10/21/15  Yes Jiles Prows, MD  ranitidine (ZANTAC) 300 MG tablet Take 300 mg by mouth at bedtime.   Yes Historical Provider, MD    Physical Exam: Vitals:   09/16/16 0100 09/16/16 0115 09/16/16 0130 09/16/16 0215  BP: 122/70 122/82 125/70 139/70  Pulse: 78 78 85 68  Resp:    18  Temp:    97.9 F (36.6 C)  TempSrc:    Oral  SpO2: 93% 93% 92% 97%  Weight:    73.5 kg (162 lb)  Height:    5\' 3"  (1.6 m)      Constitutional: Moderately built and nourished. Vitals:   09/16/16 0100 09/16/16 0115 09/16/16 0130 09/16/16 0215  BP: 122/70 122/82 125/70 139/70  Pulse: 78 78 85 68  Resp:    18  Temp:    97.9 F (36.6 C)  TempSrc:    Oral  SpO2: 93% 93% 92% 97%  Weight:    73.5 kg (162 lb)  Height:    5\' 3"  (1.6 m)   Eyes: Anicteric no pallor. ENMT: No discharge from the ears eyes nose or mouth. Neck: No neck rigidity no mass felt. Respiratory: No rhonchi or crepitations. Cardiovascular: S1-S2 heard no murmurs appreciated. Abdomen: Soft nontender bowel sounds present. Musculoskeletal: No edema. No joint effusion. Skin: No rash. Skin appears warm. Neurologic: Alert awake oriented to time place and person. Moves all extremities 5 x 5. No facial asymmetry. Tongue was midline. Pupils are equal and reacting to light. Psychiatric: Appears normal. Normal affect.   Labs on Admission: I have personally reviewed following labs and imaging studies  CBC:  Recent Labs Lab 09/15/16 1540  WBC 7.1   NEUTROABS 4.6  HGB 12.5  HCT 38.8  MCV 80.8  PLT 182   Basic Metabolic Panel:  Recent Labs Lab 09/15/16 1540  NA 137  K 3.3*  CL 104  CO2 25  GLUCOSE 132*  BUN 19  CREATININE 1.06*  CALCIUM 9.0   GFR: Estimated Creatinine Clearance: 51.5 mL/min (A) (by C-G formula based on SCr of 1.06 mg/dL (H)). Liver Function Tests:  Recent Labs Lab 09/15/16 1540  AST 25  ALT 19  ALKPHOS 124  BILITOT 0.3  PROT 7.4  ALBUMIN 4.3   No results for input(s): LIPASE, AMYLASE in the last 168 hours. No results for input(s): AMMONIA in the last 168 hours. Coagulation  Profile:  Recent Labs Lab 09/15/16 1540  INR 0.98   Cardiac Enzymes:  Recent Labs Lab 09/15/16 1540  TROPONINI <0.03   BNP (last 3 results) No results for input(s): PROBNP in the last 8760 hours. HbA1C: No results for input(s): HGBA1C in the last 72 hours. CBG:  Recent Labs Lab 09/15/16 1540  GLUCAP 116*   Lipid Profile: No results for input(s): CHOL, HDL, LDLCALC, TRIG, CHOLHDL, LDLDIRECT in the last 72 hours. Thyroid Function Tests: No results for input(s): TSH, T4TOTAL, FREET4, T3FREE, THYROIDAB in the last 72 hours. Anemia Panel: No results for input(s): VITAMINB12, FOLATE, FERRITIN, TIBC, IRON, RETICCTPCT in the last 72 hours. Urine analysis: No results found for: COLORURINE, APPEARANCEUR, LABSPEC, PHURINE, GLUCOSEU, HGBUR, BILIRUBINUR, KETONESUR, PROTEINUR, UROBILINOGEN, NITRITE, LEUKOCYTESUR Sepsis Labs: @LABRCNTIP (procalcitonin:4,lacticidven:4) )No results found for this or any previous visit (from the past 240 hour(s)).   Radiological Exams on Admission: Mr Brain Wo Contrast (neuro Protocol)  Result Date: 09/15/2016 CLINICAL DATA:  Initial evaluation for acute vertigo. EXAM: MRI HEAD WITHOUT CONTRAST TECHNIQUE: Multiplanar, multiecho pulse sequences of the brain and surrounding structures were obtained without intravenous contrast. COMPARISON:  Prior CT from earlier the same day. FINDINGS:  Brain: Study degraded by motion artifact. Cerebral volume within normal limits for age. Minimal patchy T2/FLAIR hyperintensity present within the periventricular and deep white matter both cerebral hemispheres, nonspecific, but most like related chronic small vessel ischemic disease. Changes are minimal for age. No abnormal foci of restricted diffusion to suggest acute or subacute ischemia. Gray-white matter differentiation maintained. No evidence for acute intracranial hemorrhage. No areas of chronic infarction identified. No mass lesion, midline shift or mass effect. Ventricles normal size without evidence for hydrocephalus. No extra-axial fluid collection. Major dural sinuses are grossly patent. Pituitary gland within normal limits. Suprasellar region normal. Midline structures intact and normal. Vascular: Major intracranial vascular flow voids are maintained. Skull and upper cervical spine: Craniocervical junction normal. Visualized upper cervical spine within normal limits. Bone marrow signal intensity normal. No scalp soft tissue abnormality. Sinuses/Orbits: Globes oral soft tissues within normal limits. Patient status post lens extraction bilaterally. Mild mucosal thickening within the left sphenoid sinus. Paranasal sinuses are otherwise largely clear. No mastoid effusion. Inner ear structures normal. Other: No other significant finding. IMPRESSION: 1. No acute intracranial process identified. 2. Minimal cerebral white matter changes for patient age, nonspecific, but most likely related to chronic microvascular ischemic disease. Electronically Signed   By: Jeannine Boga M.D.   On: 09/15/2016 23:33   Ct Head Code Stroke W/o Cm  Result Date: 09/15/2016 CLINICAL DATA:  Code stroke. 66 y/o F; dizziness, blurred vision, and stumbling gait. Headaches since 11 a.m. EXAM: CT HEAD WITHOUT CONTRAST TECHNIQUE: Contiguous axial images were obtained from the base of the skull through the vertex without  intravenous contrast. COMPARISON:  None available. FINDINGS: Brain: No evidence of acute infarction, hemorrhage, hydrocephalus, extra-axial collection or mass lesion/mass effect. Vascular: Mild calcific atherosclerosis of the cavernous and paraclinoid internal carotid arteries. Skull: Normal. Negative for fracture or focal lesion. Sinuses/Orbits: Postsurgical changes related to ethmoidectomy and maxillary antrostomy are partially visualized. Trace posterior left mastoid air cell effusion with sclerosis compatible sequelae of chronic otomastoiditis. Mastoid air cells are otherwise normally aerated. Bilateral intra-ocular lens replacement. Other: None. ASPECTS [facility] Stroke Program Early CT Score) - Ganglionic level infarction (caudate, lentiform nuclei, internal capsule, insula, M1-M3 cortex): 7 - Supraganglionic infarction (M4-M6 cortex): 3 Total score (0-10 with 10 being normal): 10 IMPRESSION: 1. No acute intracranial abnormality.  Unremarkable  CT of the head. 2. ASPECTS is 10 These results were called by telephone at the time of interpretation on 09/15/2016 at 4:12 pm to Dr. Charlesetta Shanks , who verbally acknowledged these results. Electronically Signed   By: Kristine Garbe M.D.   On: 09/15/2016 16:13    EKG: Independently reviewed. Normal sinus rhythm.  Assessment/Plan Principal Problem:   TIA (transient ischemic attack) Active Problems:   Breast cancer ([place])    1. TIA - discuss with Dr. Cheral Marker, on-call neurologist who has recommended further workup for TIA including MRA of the brain 2-D echo carotid Doppler. Patient will be on aspirin. 2. History of asthma - not actively wheezing. 3. History of breast cancer in remission.   DVT prophylaxis: Lovenox. Code Status: Full code.  Family Communication: Discussed with patient.  Disposition Plan: Home.  Consults called: Neurologist.  Admission status: Observation.    Rise Patience MD [facility] Pager [phone].  If 7PM-7AM, please contact night-coverage [email or web address] Password [facility]  09/16/2016, 3:32 AM

## 2016-09-16 NOTE — Progress Notes (Signed)
SLP Cancellation Note  Patient Details Name: MIKIYAH GLASNER MRN: [id number] DOB: Jan 24, 1951   Cancelled treatment:       Reason Eval/Treat Not Completed: SLP screened, no needs identified, will sign off   Juan Quam Laurice 09/16/2016, 10:49 AM

## 2016-09-16 NOTE — Evaluation (Signed)
Occupational Therapy Evaluation and Discharge Patient Details Name: [name] MRN: [id number] DOB: July 24, 1950 Today's Date: 09/16/2016    History of Present Illness Pt is a 66 y.o. female with history of bronchial asthma, breast cancer in remission presents to the ER with complaints of dizziness and hand shaking, Patient had a sensation of things spinning around, and difficulty walking. MRI with no acute findings.   Clinical Impression   PTA Pt independent in ADL/IADL and mobility. Pt currently supervision level for ADL and min guard for mobility. Per Pt she feels like this is an episode of vertigo that will pass once she gets her sinus infection taken care of. Talked extensively with Pt about safety and compensatory strategies for ADL and there are no further OT needs at this time. Pt with no questions or concerns at the end of session. OT to sign off, thank you for this referral.     Follow Up Recommendations  No OT follow up    Equipment Recommendations  None recommended by OT    Recommendations for Other Services       Precautions / Restrictions Precautions Precautions: Fall Restrictions Weight Bearing Restrictions: No      Mobility Bed Mobility Overal bed mobility: Needs Assistance Bed Mobility: Supine to Sit     Supine to sit: Supervision     General bed mobility comments: Pt sitting EOB with PT when OT entered the room  Transfers Overall transfer level: Needs assistance Equipment used: None Transfers: Sit to/from Stand Sit to Stand: Min guard         General transfer comment: guard for safety    Balance Overall balance assessment: Needs assistance Sitting-balance support: No upper extremity supported;Feet supported Sitting balance-Leahy Scale: Good     Standing balance support: No upper extremity supported Standing balance-Leahy Scale: Fair Standing balance comment: able to perform ADL at sink with no LOB,                High Level Balance  Comments: able to stand static without UE support eyes open/closed (minimal sway); able to reach in all quadrants of motion shifting center of gravity with no LOB eyes open           ADL either performed or assessed with clinical judgement   ADL Overall ADL's : Needs assistance/impaired Eating/Feeding: Modified independent;Sitting   Grooming: Wash/dry hands;Oral care;Applying deodorant;Brushing hair;Supervision/safety;Standing Grooming Details (indicate cue type and reason): sink level grooming Upper Body Bathing: Supervision/ safety   Lower Body Bathing: Supervison/ safety   Upper Body Dressing : Supervision/safety   Lower Body Dressing: Supervision/safety;Sitting/lateral leans Lower Body Dressing Details (indicate cue type and reason): able to don/doff socks sitting EOB Toilet Transfer: Copy Details (indicate cue type and reason): simulated with recliner     Tub/ Shower Transfer: Supervision/safety   Functional mobility during ADLs: Min guard       Vision Baseline Vision/History: Wears glasses Wears Glasses: Reading only Patient Visual Report: No change from baseline Vision Assessment?: Yes Eye Alignment: Within Functional Limits Ocular Range of Motion: Within Functional Limits Alignment/Gaze Preference: Within Defined Limits Tracking/Visual Pursuits: Decreased smoothness of horizontal tracking;Decreased smoothness of eye movement to LEFT superior field;Other (comment) (nystagmus when holding left gaze) Saccades: Decreased speed of saccadic movement Convergence: Within functional limits Visual Fields: No apparent deficits Additional Comments: Pt able to read signs walking down the hall, Pt able to find objects in crowded bucket for ADL     Perception     Praxis  Pertinent Vitals/Pain Pain Assessment: 0-10 Pain Score: 1  Pain Location: Sinus Pain Descriptors / Indicators: Discomfort Pain Intervention(s): Heat  applied;Repositioned     Hand Dominance Right   Extremity/Trunk Assessment Upper Extremity Assessment Upper Extremity Assessment: Overall WFL for tasks assessed;Generalized weakness (denies sensory differences, no shaking; general 4/5 strength)   Lower Extremity Assessment Lower Extremity Assessment: Defer to PT evaluation   Cervical / Trunk Assessment Cervical / Trunk Assessment: Normal   Communication Communication Communication: No difficulties   Cognition Arousal/Alertness: Awake/alert Behavior During Therapy: WFL for tasks assessed/performed Overall Cognitive Status: Within Functional Limits for tasks assessed                                     General Comments  Pt reports feeling mild dizziness. Pt denies any change in symptoms static or with movement.     Exercises     Shoulder Instructions      Home Living Family/patient expects to be discharged to:: Private residence Living Arrangements: Spouse/significant other Available Help at Discharge: Available 24 hours/day;Family Type of Home: House Home Access: Stairs to enter [employer] of Steps: 3 Entrance Stairs-Rails: Left Home Layout: One level     Bathroom Shower/Tub: Occupational psychologist: Handicapped height     Home Equipment: None   Additional Comments: spouse has medical problems, limited physical support, daughter works but able to assist as able.       Prior Functioning/Environment Level of Independence: Independent        Comments: driving, enjoys walking the dog        OT Problem List: Impaired balance (sitting and/or standing)      OT Treatment/Interventions:      OT Goals(Current goals can be found in the care plan section) Acute Rehab OT Goals Patient Stated Goal: get rid of dizziness OT Goal Formulation: With patient Time For Goal Achievement: 09/30/16 Potential to Achieve Goals: Good  OT Frequency:     Barriers to D/C:             Co-evaluation              AM-PAC PT "6 Clicks" Daily Activity     Outcome Measure Help from another person eating meals?: None Help from another person taking care of personal grooming?: None Help from another person toileting, which includes using toliet, bedpan, or urinal?: A Little Help from another person bathing (including washing, rinsing, drying)?: None Help from another person to put on and taking off regular upper body clothing?: None Help from another person to put on and taking off regular lower body clothing?: None 6 Click Score: 23   End of Session Equipment Utilized During Treatment: Gait belt Nurse Communication: Mobility status  Activity Tolerance: Patient tolerated treatment well Patient left: in chair;with call bell/phone within reach;with family/visitor present  OT Visit Diagnosis: Unsteadiness on feet (R26.81);Dizziness and giddiness (R42)                Time: 1856-3149 OT Time Calculation (min): 22 min Charges:  OT General Charges $OT Visit: 1 Procedure OT Evaluation $OT Eval Low Complexity: 1 Procedure G-Codes: OT G-codes **NOT FOR INPATIENT CLASS** Functional Assessment Tool Used: AM-PAC 6 Clicks Daily Activity Functional Limitation: Self care Self Care Current Status (F0263): At least 1 percent but less than 20 percent impaired, limited or restricted Self Care Goal Status (Z8588): 0 percent impaired, limited or restricted  Self Care Discharge Status [phone]): At least 1 percent but less than 20 percent impaired, limited or restricted   Hulda Humphrey OTR/L [place] 09/16/2016, 10:46 AM

## 2016-09-16 NOTE — Discharge Summary (Signed)
Physician Discharge Summary  [name] WFU:[id number] DOB: Sep 01, 1950 DOA: 09/15/2016  PCP: Nena Polio, NP  Admit date: 09/15/2016 Discharge date: 09/16/2016   Recommendations for Outpatient Follow-Up:   1. HgbA1C pending   Discharge Diagnosis:   Principal Problem:   TIA (transient ischemic attack) Active Problems:   Breast cancer [facility])   Discharge disposition:  Home  Discharge Condition: Improved.  Diet recommendation: Low sodium, heart healthy.  Carbohydrate-modified.  Regular.  Wound care: None.   History of Present Illness:    [name] is a 66 y.o. female with history of bronchial asthma, breast cancer in remission presents to the ER with complaints of dizziness and hand shaking. Patient's symptoms started around 11 AM while working on the computer. Persisted for around 2-3 hours and patient decided to come to the ER. Patient had a sensation of things spinning around. Denies any visual symptoms difficulty swallowing or speaking or any weakness of extremities. Patient also had difficulty walking.   Hospital Course by Problem:   Possible vertebrobasilar TIA  MRI  No acute stroke. Minimal small vessel disease.  MRA  Unremarkable   Carotid Doppler Bilateral: No significant (1-39%) ICA stenosis. Antegrade vertebral flow.  2D Echo  Study Conclusions - Left ventricle: The cavity size was normal. Systolic function was   normal. The estimated ejection fraction was in the range of 60%   to 65%. Wall motion was normal; there were no regional wall   motion abnormalities. Doppler parameters are consistent with   abnormal left ventricular relaxation (grade 1 diastolic   dysfunction). - Atrial septum: No defect or patent foramen ovale was identified.  LDL 157- statin added  HgbA1c pending  No antithrombotic prior to admission, now on aspirin 81 mg daily    Patient declined home PT    Hyperlipidemia  Home meds:  No statin  LDL 157 goal  Add lipitor  20 mg  Continue statin at discharge  Dizziness -improved with meclizine -PRN at home  Patient under a tremendous amount of stress at work which may be contributing to symptoms   Medical Consultants:   Neuro  Discharge Exam:   Vitals:   09/16/16 1037 09/16/16 1347  BP: 129/79 (!) 122/58  Pulse: 80 76  Resp: 18 18  Temp: 98.3 F (36.8 C) 98.7 F (37.1 C)   Vitals:   09/16/16 0130 09/16/16 0215 09/16/16 1037 09/16/16 1347  BP: 125/70 139/70 129/79 (!) 122/58  Pulse: 85 68 80 76  Resp:  18 18 18   Temp:  97.9 F (36.6 C) 98.3 F (36.8 C) 98.7 F (37.1 C)  TempSrc:  Oral Oral Oral  SpO2: 92% 97% 96% 95%  Weight:  73.5 kg (162 lb)    Height:  5\' 3"  (1.6 m)      Gen:  NAD    The results of significant diagnostics from this hospitalization (including imaging, microbiology, ancillary and laboratory) are listed below for reference.     Procedures and Diagnostic Studies:   Mr Brain Wo Contrast (neuro Protocol)  Result Date: 09/15/2016 CLINICAL DATA:  Initial evaluation for acute vertigo. EXAM: MRI HEAD WITHOUT CONTRAST TECHNIQUE: Multiplanar, multiecho pulse sequences of the brain and surrounding structures were obtained without intravenous contrast. COMPARISON:  Prior CT from earlier the same day. FINDINGS: Brain: Study degraded by motion artifact. Cerebral volume within normal limits for age. Minimal patchy T2/FLAIR hyperintensity present within the periventricular and deep white matter both cerebral hemispheres, nonspecific, but most like related chronic small vessel ischemic  disease. Changes are minimal for age. No abnormal foci of restricted diffusion to suggest acute or subacute ischemia. Gray-white matter differentiation maintained. No evidence for acute intracranial hemorrhage. No areas of chronic infarction identified. No mass lesion, midline shift or mass effect. Ventricles normal size without evidence for hydrocephalus. No extra-axial fluid collection. Major dural  sinuses are grossly patent. Pituitary gland within normal limits. Suprasellar region normal. Midline structures intact and normal. Vascular: Major intracranial vascular flow voids are maintained. Skull and upper cervical spine: Craniocervical junction normal. Visualized upper cervical spine within normal limits. Bone marrow signal intensity normal. No scalp soft tissue abnormality. Sinuses/Orbits: Globes oral soft tissues within normal limits. Patient status post lens extraction bilaterally. Mild mucosal thickening within the left sphenoid sinus. Paranasal sinuses are otherwise largely clear. No mastoid effusion. Inner ear structures normal. Other: No other significant finding. IMPRESSION: 1. No acute intracranial process identified. 2. Minimal cerebral white matter changes for patient age, nonspecific, but most likely related to chronic microvascular ischemic disease. Electronically Signed   By: Jeannine Boga M.D.   On: 09/15/2016 23:33   Mr [name] Head/brain XV Cm  Result Date: 09/16/2016 CLINICAL DATA:  TIA EXAM: MRA HEAD WITHOUT CONTRAST TECHNIQUE: Angiographic images of the Circle of Willis were obtained using MRA technique without intravenous contrast. COMPARISON:  MRI head 09/15/2016 FINDINGS: Both vertebral arteries widely patent. Right PICA patent. Dominant left AICA patent. Basilar widely patent. Superior cerebellar and posterior cerebral arteries patent bilaterally. Fetal origin of the right posterior cerebral artery with hypoplastic right P1 segment. Internal carotid artery widely patent bilaterally. Anterior and middle cerebral arteries normal Negative for cerebral aneurysm. IMPRESSION: Negative MRA circle of Willis. Electronically Signed   By: Franchot Gallo M.D.   On: 09/16/2016 08:31   Ct Head Code Stroke W/o Cm  Result Date: 09/15/2016 CLINICAL DATA:  Code stroke. 66 y/o F; dizziness, blurred vision, and stumbling gait. Headaches since 11 a.m. EXAM: CT HEAD WITHOUT CONTRAST TECHNIQUE:  Contiguous axial images were obtained from the base of the skull through the vertex without intravenous contrast. COMPARISON:  None available. FINDINGS: Brain: No evidence of acute infarction, hemorrhage, hydrocephalus, extra-axial collection or mass lesion/mass effect. Vascular: Mild calcific atherosclerosis of the cavernous and paraclinoid internal carotid arteries. Skull: Normal. Negative for fracture or focal lesion. Sinuses/Orbits: Postsurgical changes related to ethmoidectomy and maxillary antrostomy are partially visualized. Trace posterior left mastoid air cell effusion with sclerosis compatible sequelae of chronic otomastoiditis. Mastoid air cells are otherwise normally aerated. Bilateral intra-ocular lens replacement. Other: None. ASPECTS [facility] Stroke Program Early CT Score) - Ganglionic level infarction (caudate, lentiform nuclei, internal capsule, insula, M1-M3 cortex): 7 - Supraganglionic infarction (M4-M6 cortex): 3 Total score (0-10 with 10 being normal): 10 IMPRESSION: 1. No acute intracranial abnormality.  Unremarkable CT of the head. 2. ASPECTS is 10 These results were called by telephone at the time of interpretation on 09/15/2016 at 4:12 pm to Dr. Charlesetta Shanks , who verbally acknowledged these results. Electronically Signed   By: Kristine Garbe M.D.   On: 09/15/2016 16:13     Labs:   Basic Metabolic Panel:  Recent Labs Lab 09/15/16 1540 09/16/16 0709  NA 137  --   K 3.3*  --   CL 104  --   CO2 25  --   GLUCOSE 132*  --   BUN 19  --   CREATININE 1.06* 0.93  CALCIUM 9.0  --    GFR Estimated Creatinine Clearance: 58.7 mL/min (by C-G formula based on SCr  of 0.93 mg/dL). Liver Function Tests:  Recent Labs Lab 09/15/16 1540  AST 25  ALT 19  ALKPHOS 124  BILITOT 0.3  PROT 7.4  ALBUMIN 4.3   No results for input(s): LIPASE, AMYLASE in the last 168 hours. No results for input(s): AMMONIA in the last 168 hours. Coagulation profile  Recent Labs Lab  09/15/16 1540  INR 0.98    CBC:  Recent Labs Lab 09/15/16 1540 09/16/16 0709  WBC 7.1 4.8  NEUTROABS 4.6  --   HGB 12.5 11.7*  HCT 38.8 36.3  MCV 80.8 81.0  PLT 311 251   Cardiac Enzymes:  Recent Labs Lab 09/15/16 1540  TROPONINI <0.03   BNP: Invalid input(s): POCBNP CBG:  Recent Labs Lab 09/15/16 1540  GLUCAP 116*   D-Dimer No results for input(s): DDIMER in the last 72 hours. Hgb A1c No results for input(s): HGBA1C in the last 72 hours. Lipid Profile  Recent Labs  09/16/16 0709  CHOL 253*  HDL 76  LDLCALC 157*  TRIG 99  CHOLHDL 3.3   Thyroid function studies No results for input(s): TSH, T4TOTAL, T3FREE, THYROIDAB in the last 72 hours.  Invalid input(s): FREET3 Anemia work up No results for input(s): VITAMINB12, FOLATE, FERRITIN, TIBC, IRON, RETICCTPCT in the last 72 hours. Microbiology No results found for this or any previous visit (from the past 240 hour(s)).   Discharge Instructions:   Discharge Instructions    Ambulatory referral to Neurology    Complete by:  As directed    An appointment is requested in approximately: 1 month     Allergies as of 09/16/2016      Reactions   Amoxicillin-pot Clavulanate Nausea And Vomiting   Doxycycline Nausea And Vomiting   Nitrofurantoin Macrocrystal Other (See Comments)   Edema Edema   Pregabalin Other (See Comments)   Blurry vision Blurry vision      Medication List    TAKE these medications   acetaminophen 500 MG tablet Commonly known as:  TYLENOL Take 1,000 mg by mouth every 6 (six) hours as needed for pain. Reported on 05/15/2015   ALPRAZolam 0.5 MG tablet Commonly known as:  XANAX Take 0.5 mg by mouth at bedtime as needed for sleep.   aspirin 81 MG EC tablet Take 1 tablet (81 mg total) by mouth daily. Start taking on:  09/17/2016   atorvastatin 20 MG tablet Commonly known as:  LIPITOR Take 1 tablet (20 mg total) by mouth daily at 6 PM.   brimonidine 0.2 % ophthalmic  solution Commonly known as:  ALPHAGAN Place 1 drop into both eyes every morning.   cetirizine 10 MG tablet Commonly known as:  ZYRTEC Take 1 tablet (10 mg total) by mouth daily. What changed:  when to take this  reasons to take this   dexlansoprazole 60 MG capsule Commonly known as:  DEXILANT Take 1 capsule (60 mg total) by mouth daily.   EPINEPHrine 0.3 mg/0.3 mL Soaj injection Commonly known as:  EPIPEN 2-PAK Inject 0.3 mLs (0.3 mg total) into the muscle daily as needed (allergic reaction).   escitalopram 20 MG tablet Commonly known as:  LEXAPRO Take 20 mg by mouth daily.   gabapentin 300 MG capsule Commonly known as:  NEURONTIN Take 1 capsule (300 mg total) by mouth at bedtime.   latanoprost 0.005 % ophthalmic solution Commonly known as:  XALATAN Place 1 drop into both eyes every morning.   mometasone-formoterol 200-5 MCG/ACT Aero Commonly known as:  DULERA Inhale 2 puffs into the  lungs 2 (two) times daily. What changed:  when to take this  reasons to take this   montelukast 10 MG tablet Commonly known as:  SINGULAIR Take 1 tablet (10 mg total) by mouth at bedtime.   omalizumab 150 MG injection Commonly known as:  XOLAIR Inject 300 mg into the skin every 28 (twenty-eight) days.   QNASL 80 MCG/ACT Aers Generic drug:  Beclomethasone Dipropionate Place 1 spray into the nose daily as needed (allergies). Reported on 05/15/2015   ranitidine 300 MG tablet Commonly known as:  ZANTAC Take 300 mg by mouth at bedtime.         Time coordinating discharge: 25 min  Signed:  [facility] 09/16/2016, 4:36 PM

## 2016-09-16 NOTE — Progress Notes (Signed)
  Echocardiogram 2D Echocardiogram has been performed.  Johny Chess 09/16/2016, 4:26 PM

## 2016-09-17 LAB — VAS US CAROTID
LCCADDIAS: 20 cm/s
LCCAPDIAS: 17 cm/s
LEFT ECA DIAS: -19 cm/s
LEFT VERTEBRAL DIAS: 19 cm/s
LICADDIAS: -19 cm/s
LICADSYS: -44 cm/s
LICAPSYS: -62 cm/s
Left CCA dist sys: 75 cm/s
Left CCA prox sys: 71 cm/s
Left ICA prox dias: -20 cm/s
RIGHT ECA DIAS: -10 cm/s
RIGHT VERTEBRAL DIAS: 16 cm/s
Right CCA prox dias: 22 cm/s
Right CCA prox sys: 70 cm/s
Right cca dist sys: -57 cm/s

## 2016-09-17 LAB — HEMOGLOBIN A1C
Hgb A1c MFr Bld: 5.9 % — ABNORMAL HIGH (ref 4.8–5.6)
Mean Plasma Glucose: 123 mg/dL

## 2016-09-17 NOTE — Care Management Note (Signed)
Case Management Note  Patient Details  Name: [name] MRN: [id number] Date of Birth: 08/18/1950  Subjective/Objective:                    Action/Plan: 09/17/2016 at 1500: Patient d/c home late yesterday afternoon. Pt with orders for [facility] PT. CM met with the patients daughter and husband and provided a list of [place] agencies. They selected [facility]. Santiago Glad with [facility] was unable to accepted the referral.  [facility]. CM called Tommi Rumps with [facility] and they were able to accept the referral.  CM called [name] and she has decided she does not need [place] services. She states she is doing much better and will notify her PCP if she decides she needs therapy at home. CM informed Tommi Rumps with [facility] that there will be no HH services at this time.    Expected Discharge Date:  09/16/16               Expected Discharge Plan:  [place]  In-House Referral:     Discharge planning Services  CM Consult  Post Acute Care Choice:  Home Health Choice offered to:  Spouse, Adult Children  DME Arranged:    DME Agency:     HH Arranged:  PT [place]:  [place]  Status of Service:  Completed, signed off  If discussed at [place] of Stay Meetings, dates discussed:    Additional Comments:  Pollie Friar, RN 09/17/2016, 3:07 PM

## 2016-09-18 ENCOUNTER — Telehealth: Payer: Self-pay

## 2016-09-18 MED ORDER — PREDNISONE 10 MG PO TABS: 10.0000 mg | ORAL_TABLET | Freq: Every day | ORAL | 0 refills | Status: DC

## 2016-09-18 NOTE — Telephone Encounter (Signed)
Prednisone sent to pharmacy. Made pt aware.

## 2016-09-18 NOTE — Telephone Encounter (Signed)
Patient was admitted to the hospital on 09/15/2016. She was told to follow up with Dr. Neldon Mc. Patient is having some sinus pressure and stuffiness. She is wondering if she could be given some prednisone to break up what's going on. ED note is in EPIC.

## 2016-09-18 NOTE — Telephone Encounter (Signed)
Can use prednisone 10 mg a day for 10 days only. See me in clinic next week

## 2016-09-18 NOTE — Telephone Encounter (Signed)
Notified patient, she will come in on the 8th at 11:00 to see Dr. Neldon Mc. Patient would like a prescription sent into [facility].   Thanks

## 2016-09-22 ENCOUNTER — Ambulatory Visit

## 2016-09-22 ENCOUNTER — Encounter: Payer: Self-pay | Admitting: Allergy and Immunology

## 2016-09-22 ENCOUNTER — Ambulatory Visit (INDEPENDENT_AMBULATORY_CARE_PROVIDER_SITE_OTHER): Admitting: Allergy and Immunology

## 2016-09-22 VITALS — BP 128/80 | HR 76 | Resp 16

## 2016-09-22 DIAGNOSIS — J3089 Other allergic rhinitis: Secondary | ICD-10-CM | POA: Diagnosis not present

## 2016-09-22 DIAGNOSIS — J4540 Moderate persistent asthma, uncomplicated: Secondary | ICD-10-CM | POA: Diagnosis not present

## 2016-09-22 DIAGNOSIS — J321 Chronic frontal sinusitis: Secondary | ICD-10-CM

## 2016-09-22 DIAGNOSIS — H6983 Other specified disorders of Eustachian tube, bilateral: Secondary | ICD-10-CM

## 2016-09-22 DIAGNOSIS — K219 Gastro-esophageal reflux disease without esophagitis: Secondary | ICD-10-CM

## 2016-09-22 NOTE — Patient Instructions (Addendum)
  1. Can consider placement of ear ventilation tubes  2. Continue nasal saline multiple times per day  3. Continue Qnasl 1-2 puffs one time per day  4. Continue Dulera 200 2 inhalations two times per day during "flare up"  5. Continue montelukast 10mg  one tablet one time per day  6. Continue Omalizumab 150 monthly (and Epi-Pen)  7. Continue dexilant 60mg  in AM plus ranitidine 300 in PM  8. Continue cetirizine and albuterol MDI if needed.  9. Return in 6 months or earlier if problem

## 2016-09-22 NOTE — Progress Notes (Signed)
Follow-up Note  Referring Provider: Nena Polio, NP Primary Provider: Nena Polio, NP Date of Office Visit: 09/22/2016  Subjective:   [name] (DOB: 1951-02-17) is a 66 y.o. female who returns to the [facility] [place] on 09/22/2016 in re-evaluation of the following:  HPI: Dilia returns to this clinic in reevaluation of her asthma and allergic rhinitis and chronic sinusitis and history of LPR. I have not seen her in this clinic since October 2017.  During the interval she did require a systemic steroid and an antibiotic in March of this year for what appeared to be an episode of sinusitis with a significant amount of inflammation affecting both her upper and lower airways. Other than that single event she has really done very well with her respiratory tract and she rarely uses a short acting bronchodilator and she can exercise without any difficulty. She continues on omalizumab injections and has not had any adverse effects secondary to the use of this medication.  Likewise her reflux is under excellent control while using a combination of Dexilant and ranitidine.  What is bothering her is the fact that she has constant popping and cracking in her ears. She is wondering if anything can be done about this issue.  As well, she was recently admitted to the hospital for what sounds like evaluation for a TIA and vertigo.  Allergies as of 09/22/2016      Reactions   Amoxicillin-pot Clavulanate Nausea And Vomiting   Doxycycline Nausea And Vomiting   Nitrofurantoin Macrocrystal Other (See Comments)   Edema Edema   Pregabalin Other (See Comments)   Blurry vision Blurry vision      Medication List      acetaminophen 500 MG tablet Commonly known as:  TYLENOL Take 1,000 mg by mouth every 6 (six) hours as needed for pain. Reported on 05/15/2015   ALPRAZolam 0.5 MG tablet Commonly known as:  XANAX Take 0.5 mg by mouth at bedtime as needed for sleep.   aspirin 81  MG EC tablet Take 1 tablet (81 mg total) by mouth daily.   atorvastatin 20 MG tablet Commonly known as:  LIPITOR Take 1 tablet (20 mg total) by mouth daily at 6 PM.   brimonidine 0.2 % ophthalmic solution Commonly known as:  ALPHAGAN Place 1 drop into both eyes every morning.   cetirizine 10 MG tablet Commonly known as:  ZYRTEC Take 1 tablet (10 mg total) by mouth daily.   dexlansoprazole 60 MG capsule Commonly known as:  DEXILANT Take 1 capsule (60 mg total) by mouth daily.   EPINEPHrine 0.3 mg/0.3 mL Soaj injection Commonly known as:  EPIPEN 2-PAK Inject 0.3 mLs (0.3 mg total) into the muscle daily as needed (allergic reaction).   escitalopram 20 MG tablet Commonly known as:  LEXAPRO Take 20 mg by mouth daily.   gabapentin 300 MG capsule Commonly known as:  NEURONTIN Take 1 capsule (300 mg total) by mouth at bedtime.   latanoprost 0.005 % ophthalmic solution Commonly known as:  XALATAN Place 1 drop into both eyes every morning.   meclizine 12.5 MG tablet Commonly known as:  ANTIVERT Take 1 tablet (12.5 mg total) by mouth 3 (three) times daily as needed for dizziness.   mometasone-formoterol 200-5 MCG/ACT Aero Commonly known as:  DULERA Inhale 2 puffs into the lungs 2 (two) times daily.   montelukast 10 MG tablet Commonly known as:  SINGULAIR Take 1 tablet (10 mg total) by mouth at bedtime.  omalizumab 150 MG injection Commonly known as:  XOLAIR Inject 300 mg into the skin every 28 (twenty-eight) days.   predniSONE 10 MG tablet Commonly known as:  DELTASONE Take 1 tablet (10 mg total) by mouth daily with breakfast.   QNASL 80 MCG/ACT Aers Generic drug:  Beclomethasone Dipropionate Place 1 spray into the nose daily as needed (allergies). Reported on 05/15/2015   ranitidine 300 MG tablet Commonly known as:  ZANTAC Take 300 mg by mouth at bedtime.       Past Medical History:  Diagnosis Date  . Allergy history unknown   . Asthma   . Breast cancer  ([place]) 03/24/2011  . Bronchitis   . Dizziness   . GERD (gastroesophageal reflux disease)   . Glaucoma   . Headache(784.0)   . Pneumonia    hx of  . PONV (postoperative nausea and vomiting)   . Shortness of breath    asthma  . Sinusitis     Past Surgical History:  Procedure Laterality Date  . BREAST LUMPECTOMY  08-2009   left   . EYE SURGERY Bilateral    laser surgery - glaucoma  . eyelid surgery Bilateral    for glaucoma  . NOSE SURGERY  12-2006  . OOPHORECTOMY  2001  . SINUS ENDO W/FUSION Bilateral 02/16/2013   Procedure: ENDOSCOPIC SINUS SURGERY WITH FUSION NAVIGATION;  Surgeon: Jerrell Belfast, MD;  Location: [facility] OR;  Service: ENT;  Laterality: Bilateral;    Review of systems negative except as noted in HPI / PMHx or noted below:  Review of Systems  Constitutional: Negative.   HENT: Negative.   Eyes: Negative.   Respiratory: Negative.   Cardiovascular: Negative.   Gastrointestinal: Negative.   Genitourinary: Negative.   Musculoskeletal: Negative.   Skin: Negative.   Neurological: Negative.   Endo/Heme/Allergies: Negative.   Psychiatric/Behavioral: Negative.      Objective:   Vitals:   09/22/16 1129  BP: 128/80  Pulse: 76  Resp: 16          Physical Exam  Constitutional: She is well-developed, well-nourished, and in no distress.  HENT:  Head: Normocephalic.  Right Ear: Tympanic membrane, external ear and ear canal normal.  Left Ear: Tympanic membrane, external ear and ear canal normal.  Nose: Nose normal. No mucosal edema or rhinorrhea.  Mouth/Throat: Uvula is midline, oropharynx is clear and moist and mucous membranes are normal. No oropharyngeal exudate.  Eyes: Conjunctivae are normal.  Neck: Trachea normal. No tracheal tenderness present. No tracheal deviation present. No thyromegaly present.  Cardiovascular: Normal rate, regular rhythm, S1 normal, S2 normal and normal heart sounds.   No murmur heard. Pulmonary/Chest: Breath sounds normal. No  stridor. No respiratory distress. She has no wheezes. She has no rales.  Musculoskeletal: She exhibits no edema.  Lymphadenopathy:       Head (right side): No tonsillar adenopathy present.       Head (left side): No tonsillar adenopathy present.    She has no cervical adenopathy.  Neurological: She is alert. Gait normal.  Skin: No rash noted. She is not diaphoretic. No erythema. Nails show no clubbing.  Psychiatric: Mood and affect normal.    Diagnostics: Results of a head MRI obtained 09/15/2016 identified the following:  Sinuses/Orbits: Globes oral soft tissues within normal limits. Patient status post lens extraction bilaterally. Mild mucosal thickening within the left sphenoid sinus. Paranasal sinuses are otherwise largely clear. No mastoid effusion. Inner ear structures normal.   Spirometry was performed and demonstrated an FEV1 of  1.95 at 90 % of predicted.  Assessment and Plan:   1. Moderate persistent asthma, uncomplicated   2. Other allergic rhinitis   3. Chronic frontal sinusitis   4. LPRD (laryngopharyngeal reflux disease)   5. ETD (Eustachian tube dysfunction), bilateral     1. Can consider placement of ear ventilation tubes  2. Continue nasal saline multiple times per day  3. Continue Qnasl 1-2 puffs one time per day  4. Continue Dulera 200 2 inhalations two times per day during "flare up"  5. Continue montelukast 10mg  one tablet one time per day  6. Continue Omalizumab 150 monthly (and Epi-Pen)  7. Continue dexilant 60mg  in AM plus ranitidine 300 in PM  8. Continue cetirizine and albuterol MDI if needed.  9. Return in 6 months or earlier if problem  Charo appears to be doing relatively well with her respiratory tract and although she did have one flare up of her asthma I do not think that there is a need to change her plan at this point in time. I will refer her back to her ENT doctor, Dr. Wilburn Cornelia, for consideration of possible placement of ear  ventilation tubes to resolve her chronic ear sensation that she possesses and wants addressed. I will see her back in this clinic in 6 months or earlier if there is a problem.  Allena Katz, MD Allergy / Immunology [place]

## 2016-10-20 ENCOUNTER — Ambulatory Visit (INDEPENDENT_AMBULATORY_CARE_PROVIDER_SITE_OTHER): Admitting: *Deleted

## 2016-10-20 DIAGNOSIS — J4540 Moderate persistent asthma, uncomplicated: Secondary | ICD-10-CM

## 2016-10-21 ENCOUNTER — Encounter: Payer: Self-pay | Admitting: *Deleted

## 2016-11-24 ENCOUNTER — Ambulatory Visit

## 2016-12-01 ENCOUNTER — Other Ambulatory Visit: Payer: Self-pay | Admitting: *Deleted

## 2016-12-01 ENCOUNTER — Ambulatory Visit (INDEPENDENT_AMBULATORY_CARE_PROVIDER_SITE_OTHER): Admitting: Allergy and Immunology

## 2016-12-01 VITALS — BP 124/76 | HR 76 | Resp 16

## 2016-12-01 DIAGNOSIS — J3089 Other allergic rhinitis: Secondary | ICD-10-CM

## 2016-12-01 DIAGNOSIS — H6983 Other specified disorders of Eustachian tube, bilateral: Secondary | ICD-10-CM

## 2016-12-01 DIAGNOSIS — K219 Gastro-esophageal reflux disease without esophagitis: Secondary | ICD-10-CM | POA: Diagnosis not present

## 2016-12-01 DIAGNOSIS — J4540 Moderate persistent asthma, uncomplicated: Secondary | ICD-10-CM | POA: Diagnosis not present

## 2016-12-01 MED ORDER — MONTELUKAST SODIUM 10 MG PO TABS: 10.0000 mg | ORAL_TABLET | Freq: Every day | ORAL | 3 refills | Status: DC

## 2016-12-01 MED ORDER — RANITIDINE HCL 300 MG PO TABS: 300.0000 mg | ORAL_TABLET | Freq: Every day | ORAL | 3 refills | Status: DC

## 2016-12-01 MED ORDER — MOMETASONE FURO-FORMOTEROL FUM 200-5 MCG/ACT IN AERO: 2.0000 | INHALATION_SPRAY | Freq: Two times a day (BID) | RESPIRATORY_TRACT | 3 refills | Status: DC

## 2016-12-01 MED ORDER — DEXLANSOPRAZOLE 60 MG PO CPDR: 60.0000 mg | DELAYED_RELEASE_CAPSULE | Freq: Every day | ORAL | 3 refills | Status: DC

## 2016-12-01 MED ORDER — BECLOMETHASONE DIPROPIONATE 80 MCG/ACT NA AERS: 1.0000 | INHALATION_SPRAY | Freq: Every day | NASAL | 3 refills | Status: DC | PRN

## 2016-12-01 MED ORDER — BENRALIZUMAB 30 MG/ML ~~LOC~~ SOSY: 30.0000 mg | PREFILLED_SYRINGE | SUBCUTANEOUS | 3 refills | Status: DC

## 2016-12-01 NOTE — Progress Notes (Signed)
Follow-up Note  Referring Provider: Nena Polio, NP Primary Provider: Nena Polio, NP Date of Office Visit: 12/01/2016  Subjective:   [name] (DOB: [birth year]) is a 66 y.o. female who returns to the [facility] [place] on 12/01/2016 in re-evaluation of the following:  HPI: Kashana returns to this clinic in reevaluation of her asthma and allergic rhinitis and history or chronic sinusitis and ETD and LPR. I last saw her in this clinic May 2018 at which point in time I refered her back to her ENT doctor in consideration of therapy for ETD.  She did visit with Dr. Redmond Baseman and she had placement of ear ventilation tubes approximately one month ago and then she was empirically treated for "chronic sinusitis" with a combination of Avelox and prednisone. She can't really tell much difference at this point in time regarding her ears. She still has popping and cracking. Her hearing is still somewhat diminished. She also feels as though her head is a little bit congested.  She does have lots of postnasal drip and throat clearing and intermittent raspy voice. Unfortunately, she has been out of her proton pump inhibitor for almost a month and just relies on the use of ranitidine.  She thinks that her nasal airway is doing well. She uses saline and a nasal steroid and she does feel as though she can get airflow through her nose without much difficulty.  Asthma does show up if she attempts to exert herself especially in the heat. She continues on omalizumab injections and utilizes Dulera during periods of significant symptomatology. Her requirement for short acting bronchodilator is minimal.  Allergies as of 12/01/2016      Reactions   Amoxicillin-pot Clavulanate Nausea And Vomiting   Doxycycline Nausea And Vomiting   Nitrofurantoin Macrocrystal Other (See Comments)   Edema Edema   Pregabalin Other (See Comments)   Blurry vision Blurry vision      Medication List        acetaminophen 500 MG tablet Commonly known as:  TYLENOL Take 1,000 mg by mouth every 6 (six) hours as needed for pain. Reported on 05/15/2015   ALPRAZolam 0.5 MG tablet Commonly known as:  XANAX Take 0.5 mg by mouth at bedtime as needed for sleep.   aspirin 81 MG EC tablet Take 1 tablet (81 mg total) by mouth daily.   atorvastatin 20 MG tablet Commonly known as:  LIPITOR Take 1 tablet (20 mg total) by mouth daily at 6 PM.   brimonidine 0.2 % ophthalmic solution Commonly known as:  ALPHAGAN Place 1 drop into both eyes every morning.   cetirizine 10 MG tablet Commonly known as:  ZYRTEC Take 1 tablet (10 mg total) by mouth daily.   dexlansoprazole 60 MG capsule Commonly known as:  DEXILANT Take 1 capsule (60 mg total) by mouth daily.   EPINEPHrine 0.3 mg/0.3 mL Soaj injection Commonly known as:  EPIPEN 2-PAK Inject 0.3 mLs (0.3 mg total) into the muscle daily as needed (allergic reaction).   escitalopram 20 MG tablet Commonly known as:  LEXAPRO Take 20 mg by mouth daily.   gabapentin 300 MG capsule Commonly known as:  NEURONTIN Take 1 capsule (300 mg total) by mouth at bedtime.   latanoprost 0.005 % ophthalmic solution Commonly known as:  XALATAN Place 1 drop into both eyes every morning.   meclizine 12.5 MG tablet Commonly known as:  ANTIVERT Take 1 tablet (12.5 mg total) by mouth 3 (three) times daily as needed for  dizziness.   mometasone-formoterol 200-5 MCG/ACT Aero Commonly known as:  DULERA Inhale 2 puffs into the lungs 2 (two) times daily.   montelukast 10 MG tablet Commonly known as:  SINGULAIR Take 1 tablet (10 mg total) by mouth at bedtime.   omalizumab 150 MG injection Commonly known as:  XOLAIR Inject 300 mg into the skin every 28 (twenty-eight) days.   predniSONE 10 MG tablet Commonly known as:  DELTASONE Take 1 tablet (10 mg total) by mouth daily with breakfast.   QNASL 80 MCG/ACT Aers Generic drug:  Beclomethasone Dipropionate Place 1  spray into the nose daily as needed (allergies). Reported on 05/15/2015   ranitidine 300 MG tablet Commonly known as:  ZANTAC Take 300 mg by mouth at bedtime.       Past Medical History:  Diagnosis Date  . Allergy history unknown   . Asthma   . Breast cancer ([place]) 03/24/2011  . Bronchitis   . Dizziness   . GERD (gastroesophageal reflux disease)   . Glaucoma   . Headache(784.0)   . Pneumonia    hx of  . PONV (postoperative nausea and vomiting)   . Shortness of breath    asthma  . Sinusitis     Past Surgical History:  Procedure Laterality Date  . BREAST LUMPECTOMY  08-2009   left   . EYE SURGERY Bilateral    laser surgery - glaucoma  . eyelid surgery Bilateral    for glaucoma  . NOSE SURGERY  12-2006  . OOPHORECTOMY  2001  . SINUS ENDO W/FUSION Bilateral 02/16/2013   Procedure: ENDOSCOPIC SINUS SURGERY WITH FUSION NAVIGATION;  Surgeon: Jerrell Belfast, MD;  Location: [facility] OR;  Service: ENT;  Laterality: Bilateral;    Review of systems negative except as noted in HPI / PMHx or noted below:  Review of Systems  Constitutional: Negative.   HENT: Negative.   Eyes: Negative.   Respiratory: Negative.   Cardiovascular: Negative.   Gastrointestinal: Negative.   Genitourinary: Negative.   Musculoskeletal: Negative.   Skin: Negative.   Neurological: Negative.   Endo/Heme/Allergies: Negative.   Psychiatric/Behavioral: Negative.      Objective:   Vitals:   12/01/16 1825  BP: 124/76  Pulse: 76  Resp: 16          Physical Exam  Constitutional: She is well-developed, well-nourished, and in no distress.  HENT:  Head: Normocephalic.  Right Ear: Tympanic membrane and external ear normal. A foreign body (tube) is present. Right ear middle ear effusion: tube.  Left Ear: Tympanic membrane and external ear normal. A foreign body (tube) is present.  Nose: Nose normal. No mucosal edema or rhinorrhea.  Mouth/Throat: Uvula is midline, oropharynx is clear and moist and mucous  membranes are normal. No oropharyngeal exudate.  Eyes: Conjunctivae are normal.  Neck: Trachea normal. No tracheal tenderness present. No tracheal deviation present. No thyromegaly present.  Cardiovascular: Normal rate, regular rhythm, S1 normal, S2 normal and normal heart sounds.   No murmur heard. Pulmonary/Chest: Breath sounds normal. No stridor. No respiratory distress. She has no wheezes. She has no rales.  Musculoskeletal: She exhibits no edema.  Lymphadenopathy:       Head (right side): No tonsillar adenopathy present.       Head (left side): No tonsillar adenopathy present.    She has no cervical adenopathy.  Neurological: She is alert. Gait normal.  Skin: No rash noted. She is not diaphoretic. No erythema. Nails show no clubbing.  Psychiatric: Mood and affect normal.  Diagnostics: Results of a head MRI obtained 09/15/2016 identified the following:  Sinuses/Orbits: Globes oral soft tissues within normal limits. Patient status post lens extraction bilaterally. Mild mucosal thickening within the left sphenoid sinus. Paranasal sinuses are otherwise largely clear. No mastoid effusion. Inner ear structures normal.   Spirometry was performed and demonstrated an FEV1 of 1.87 at 87 % of predicted.  The patient had an Asthma Control Test with the following results: ACT Total Score: 21.    Assessment and Plan:   1. Asthma, moderate persistent, well-controlled   2. Other allergic rhinitis   3. LPRD (laryngopharyngeal reflux disease)   4. ETD (Eustachian tube dysfunction), bilateral     1. Consider changing omalizumab to Benralizumab. Insurance coverage?   2. Continue nasal saline multiple times per day  3. Continue Qnasl 1-2 puffs one time per day  4. Continue Dulera 200 2 inhalations two times per day   5. Continue montelukast 10mg  one tablet one time per day  6. Restart dexilant 60mg  in AM plus continue ranitidine 300 in PM   7. Continue cetirizine and albuterol MDI  if needed.  8. Revisit with Dr. Redmond Baseman. May need another CT scan at his office to look for sinusitis  9. Return in 6 months or earlier if problem  10. Obtain fall flu vaccine  Maybe Arienne would benefit from a biological agent directed against tissue eosinophilia and we will see if we can get insurance approval for an anti-IL-5 biological agent to replace her anti-IgE biological agent. She will continue to use anti-inflammatory agents for her respiratory tract and I have encouraged her to consistently and aggressively treat her reflux as there is no doubt she has a component of LPR. She will visit with Dr. Redmond Baseman concerning her ETD and it may be worthwhile for her to obtain another CT scan of her sinuses while at his office to look for lingering sinusitis. I will see her back in this clinic in 6 months or earlier if there is a problem. I will contact her regarding approval concerning anti-IL-5 biological agent  Allena Katz, MD Allergy / [place]

## 2016-12-01 NOTE — Patient Instructions (Addendum)
  1. Consider changing omalizumab to Benralizumab. Insurance coverage?   2. Continue nasal saline multiple times per day  3. Continue Qnasl 1-2 puffs one time per day  4. Continue Dulera 200 2 inhalations two times per day   5. Continue montelukast 10mg  one tablet one time per day  6. Restart dexilant 60mg  in AM plus continue ranitidine 300 in PM   7. Continue cetirizine and albuterol MDI if needed.  8. Revisit with Dr. Redmond Baseman. May need another CT scan at his office to look for sinusitis  9. Return in 6 months or earlier if problem  10. Obtain fall flu vaccine

## 2016-12-02 ENCOUNTER — Encounter: Payer: Self-pay | Admitting: Allergy and Immunology

## 2016-12-03 ENCOUNTER — Telehealth: Payer: Self-pay | Admitting: Allergy and Immunology

## 2016-12-03 NOTE — Telephone Encounter (Signed)
Pt called and had a ct scan done and dr bates is out of the office and wanted to see if there is something that you can do to get the results or see if one of the other dr there could read it. [phone]

## 2016-12-03 NOTE — Telephone Encounter (Signed)
Called patient and gave update by Dr. Neldon Mc .

## 2016-12-03 NOTE — Telephone Encounter (Signed)
Please let [name] no that high looked at the report and it showed Postop ethmoidectomy and bilateral antrostomy. Mild mucosal edema in the right frontal sinus. Mild mucosal edema in the base of the maxillary sinus bilaterally which means that she still has some swelling inside her frontal and maxillary sinus cavities. But I do not have access to the actual pictures and it is difficult to define the extent of swelling without dose pictures. She will need to wait until the CT scan is read by Dr. Redmond Baseman.

## 2016-12-07 ENCOUNTER — Telehealth: Payer: Self-pay | Admitting: Allergy and Immunology

## 2016-12-07 ENCOUNTER — Other Ambulatory Visit: Payer: Self-pay | Admitting: Obstetrics and Gynecology

## 2016-12-07 DIAGNOSIS — Z853 Personal history of malignant neoplasm of breast: Secondary | ICD-10-CM

## 2016-12-07 NOTE — Telephone Encounter (Signed)
Called patient and advised since [facility] not ordering MD we will not get results of CT ordered by ENT.  Advised her that since Dr Redmond Baseman is back from vacation he should be able to read scan and contact her and just have them forward results to Dr Neldon Mc also

## 2016-12-07 NOTE — Telephone Encounter (Signed)
Patient had a CT scan done last week Was wondering about the results DR BATES - ENT - [phone]  Dr Neldon Mc had suggested the patient have  CT scan done due to tubes in her ears

## 2016-12-16 ENCOUNTER — Ambulatory Visit
Admission: RE | Admit: 2016-12-16 | Discharge: 2016-12-16 | Disposition: A | Discharge status: Home / Self Care | Source: Ambulatory Visit | Attending: Obstetrics and Gynecology | Admitting: Obstetrics and Gynecology

## 2016-12-16 DIAGNOSIS — Z853 Personal history of malignant neoplasm of breast: Secondary | ICD-10-CM

## 2016-12-16 HISTORY — DX: Personal history of irradiation: Z92.3

## 2017-01-08 ENCOUNTER — Other Ambulatory Visit: Payer: Self-pay | Admitting: Obstetrics and Gynecology

## 2017-01-08 DIAGNOSIS — N644 Mastodynia: Secondary | ICD-10-CM

## 2017-01-12 ENCOUNTER — Ambulatory Visit
Admission: RE | Admit: 2017-01-12 | Discharge: 2017-01-12 | Disposition: A | Discharge status: Home / Self Care | Source: Ambulatory Visit | Attending: Obstetrics and Gynecology | Admitting: Obstetrics and Gynecology

## 2017-01-12 DIAGNOSIS — N644 Mastodynia: Secondary | ICD-10-CM

## 2017-01-15 ENCOUNTER — Telehealth: Payer: Self-pay

## 2017-01-15 MED ORDER — FLUCONAZOLE 150 MG PO TABS: 150.0000 mg | ORAL_TABLET | Freq: Once | ORAL | 0 refills | Status: AC

## 2017-01-15 NOTE — Telephone Encounter (Signed)
Pt called and needs to have diflucan called into [facility] [phone]// [phone]

## 2017-01-15 NOTE — Telephone Encounter (Signed)
Talked to patient and advised her I had sent Rx on 7/17 and I will call to check on same.  Since medication does not come to our office only to patient difficult to track.  I called [facility] and they advised they sent letter to patient on 7/24 that they do not carry this medication.  I did check and they do not carry Nucala also.  I will discuss with Dr Neldon Mc about possibly changing to [place] in Oct. Since I know they carry this medication. I advised patient that I would discuss with Dr Neldon Mc about possibly starting her in Oct. On alternate med for her asthma until then stay on her Xolair.

## 2017-01-15 NOTE — Telephone Encounter (Signed)
Patient called about Dr. Neldon Mc switching her from [place] to [place] and said someone was suppose to call her and inform her if her insurance will cover it or not. She also said that if insurance did cover it that the rest of her Xolair to someone who cant afford it or needs it. I informed her that [facility] is our biologics and gave her the number to get in touch with Tammy.

## 2017-05-04 ENCOUNTER — Telehealth: Payer: Self-pay | Admitting: *Deleted

## 2017-05-04 NOTE — Telephone Encounter (Signed)
Dr Neldon Mc had wanted to change patient from [place] to [place] a couple months ago and [facility] ([facility]) only carries [place] of [place] and [place].  I advised patient and Dr Neldon Mc that once Dupixent comes out for asthma we could move forward with alternate drug for her asthma. I had told patient to contact me in Nov. And she had L/M for me last week.  I called and L/m for patient to call me back and I will go ahead with Dupixent Rx and have patient come in next month for scheduled followup

## 2017-05-07 MED ORDER — DUPILUMAB 300 MG/2ML ~~LOC~~ SOSY: 600.0000 mg | PREFILLED_SYRINGE | Freq: Once | SUBCUTANEOUS | 0 refills | Status: AC

## 2017-05-07 NOTE — Telephone Encounter (Signed)
Called patient back again to discuss the [place] for her asthma since we were unable to get her on IL-5 a couple months back due to [facility] do not carry any them.  I advised her if she has not heard anything from them in next 2 weeks to give me a call and I will follow up with them.  I also instructed her on what to do when she receives drug to put in fridge and contact [facility] to come in for injection instruction.

## 2017-05-25 ENCOUNTER — Ambulatory Visit (INDEPENDENT_AMBULATORY_CARE_PROVIDER_SITE_OTHER): Payer: Medicare Other | Admitting: *Deleted

## 2017-05-25 DIAGNOSIS — J4540 Moderate persistent asthma, uncomplicated: Secondary | ICD-10-CM | POA: Diagnosis not present

## 2017-05-25 NOTE — Progress Notes (Signed)
Immunotherapy   Patient Details  Name: MADGIE DHALIWAL MRN: [id number] Date of Birth: 08-30-1950  05/25/2017  Elmon Kirschner started injections for  Dupixent 600 mg loading dose and will continue 300 mg every 14 days. Epi-Pen:Epi-Pen Available  Consent signed and patient instructions given. No problems after 30 minutes in the office.  Horris Latino 05/25/2017, 12:02 PM

## 2017-08-07 ENCOUNTER — Other Ambulatory Visit: Payer: Self-pay | Admitting: Allergy

## 2017-08-07 MED ORDER — PREDNISONE 10 MG PO TABS: ORAL_TABLET | ORAL | 0 refills | Status: DC

## 2017-08-07 MED ORDER — MOXIFLOXACIN HCL 400 MG PO TABS: 400.0000 mg | ORAL_TABLET | Freq: Every day | ORAL | 0 refills | Status: AC

## 2017-08-07 NOTE — Progress Notes (Signed)
Called by pt.  Has been having frontal HA x 5 days which she thought initially was due to dietary changes but she then started having increased nasal congestion with sinus pain/pressure.  She states pushing around her eyes is very tender.  She has been taking qnasl, sinus rinses, singulair and is on Xolair which she states has helped decrease the amount of sinus infections she has had but that this time of year is a bad season for her in regards to her sinuses.    She will continue her current routine.  I have sent in prescriptions for Avelox and 5 day prednisone burst.  Advised to call next week if symptoms not improving.

## 2017-09-28 ENCOUNTER — Ambulatory Visit (INDEPENDENT_AMBULATORY_CARE_PROVIDER_SITE_OTHER): Payer: Medicare Other | Admitting: Allergy and Immunology

## 2017-09-28 ENCOUNTER — Encounter: Payer: Self-pay | Admitting: Allergy and Immunology

## 2017-09-28 VITALS — BP 152/84 | HR 72 | Resp 16 | Ht 62.0 in | Wt 161.0 lb

## 2017-09-28 DIAGNOSIS — J3089 Other allergic rhinitis: Secondary | ICD-10-CM

## 2017-09-28 DIAGNOSIS — J4540 Moderate persistent asthma, uncomplicated: Secondary | ICD-10-CM | POA: Diagnosis not present

## 2017-09-28 DIAGNOSIS — K219 Gastro-esophageal reflux disease without esophagitis: Secondary | ICD-10-CM | POA: Diagnosis not present

## 2017-09-28 DIAGNOSIS — H6983 Other specified disorders of Eustachian tube, bilateral: Secondary | ICD-10-CM

## 2017-09-28 MED ORDER — MONTELUKAST SODIUM 10 MG PO TABS: 10.0000 mg | ORAL_TABLET | Freq: Every day | ORAL | 1 refills | Status: DC

## 2017-09-28 NOTE — Patient Instructions (Addendum)
  1. Continue dupilumab every 4 weeks  2. Continue montelukast 10mg  daily  3. Can restart Qnasl 1-2 puffs one time per day during periods of upper airway symptoms   4. Can restart Dulera 200 2 inhalations two times per day during periods of asthma activity  5. Continue dexilant 60mg  in AM plus continue ranitidine 300 in PM   6. Continue cetirizine and albuterol MDI if needed.  7. For this recent issue:   A. Prednisone 10mg  one time per day for 5 days only  B. Nasal saline multiple times per day  C. ibuprofen  8. Revisit with Dr. Redmond Baseman about ear issues / ear tubes   9. Return in 6 months or earlier if problem

## 2017-09-28 NOTE — Progress Notes (Signed)
Follow-up Note  Referring Provider: Nena Polio, NP Primary Provider: Nena Polio, NP Date of Office Visit: 09/28/2017  Subjective:   [name] (DOB: [birth year]) is a 67 y.o. 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  I last saw her in this clinic 01 December 2016.  She has started dupilumab injections every 2 weeks in January 2019 and basically all of her airway issues have abated completely.  She does not use a short acting bronchodilator and she has discontinued her Dulera while continuing to use montelukast.  She has very little issues with her nose and no longer uses a nasal steroid.  She informs me that prior to starting dupilumab she did have one sinus infection requiring an antibiotic but since starting dupilumab she has not required any antibiotics or systemic steroids to treat any type of respiratory tract issue.  Her reflux is under excellent control at this point in time on a combination of a proton pump inhibitor and a H2 receptor blocker.  She does note that her ears are starting to feel as though there is some popping and gurgling and she thinks her ear tubes are coming out.  As well, over the course of the past several days she has developed hoarseness and a sore throat about 3 days ago and developed some face pain over the course of the past 3 days without any fever or anosmia or ugly nasal discharge.  Allergies as of 09/28/2017      Reactions   Amoxicillin-pot Clavulanate Nausea And Vomiting   Doxycycline Nausea And Vomiting   Nitrofurantoin Macrocrystal Other (See Comments)   Edema Edema   Pregabalin Other (See Comments)   Blurry vision Blurry vision      Medication List        acetaminophen 500 MG tablet Commonly known as:  TYLENOL Take 1,000 mg by mouth every 6 (six) hours as needed for pain. Reported on 05/15/2015   ALPRAZolam 0.5 MG tablet Commonly known as:  XANAX Take 0.5 mg by mouth at bedtime as needed for sleep.   aspirin 81 MG EC tablet Take 1 tablet (81 mg total) by mouth daily.   atorvastatin 20 MG tablet Commonly known as:  LIPITOR Take 1 tablet (20 mg total) by mouth daily at 6 PM.   Beclomethasone Dipropionate 80 MCG/ACT Aers Commonly known as:  QNASL Place 1-2 sprays into the nose daily as needed (allergies). Reported on 05/15/2015   brimonidine 0.2 % ophthalmic solution Commonly known as:  ALPHAGAN Place 1 drop into both eyes every morning.   cetirizine 10 MG tablet Commonly known as:  ZYRTEC Take 1 tablet (10 mg total) by mouth daily.   dexlansoprazole 60 MG capsule Commonly known as:  DEXILANT Take 1 capsule (60 mg total) by mouth daily.   EPINEPHrine 0.3 mg/0.3 mL Soaj injection Commonly known as:  EPIPEN 2-PAK Inject 0.3 mLs (0.3 mg total) into the muscle daily as needed (allergic reaction).   escitalopram 20 MG tablet Commonly known as:  LEXAPRO Take 20 mg by mouth daily.   gabapentin 300 MG capsule Commonly known as:  NEURONTIN Take 1 capsule (300 mg total) by mouth at bedtime.   latanoprost 0.005 % ophthalmic solution Commonly known as:  Ivin Poot  Place 1 drop into both eyes every morning.   meclizine 12.5 MG tablet Commonly known as:  ANTIVERT Take 1 tablet (12.5 mg total) by mouth 3 (three) times daily as needed for dizziness.   mometasone-formoterol 200-5 MCG/ACT Aero Commonly known as:  DULERA Inhale 2 puffs into the lungs 2 (two) times daily.   montelukast 10 MG tablet Commonly known as:  SINGULAIR Take 1 tablet (10 mg total) by mouth at bedtime.   omalizumab 150 MG injection Commonly known as:  XOLAIR Inject 300 mg into the skin every 28 (twenty-eight) days.   predniSONE 10 MG tablet Commonly  known as:  DELTASONE Take 20mg  (2 tabs) daily x 4 days then 10mg  (1 tab) x 1 day and stop   ranitidine 300 MG tablet Commonly known as:  ZANTAC Take 1 tablet (300 mg total) by mouth at bedtime.       Past Medical History:  Diagnosis Date  . Allergy history unknown   . Asthma   . Breast cancer ([place]) 03/24/2011  . Bronchitis   . Dizziness   . GERD (gastroesophageal reflux disease)   . Glaucoma   . Headache(784.0)   . Personal history of radiation therapy 2011  . Pneumonia    hx of  . PONV (postoperative nausea and vomiting)   . Shortness of breath    asthma  . Sinusitis     Past Surgical History:  Procedure Laterality Date  . BREAST LUMPECTOMY Left 08-2009   left   . EYE SURGERY Bilateral    laser surgery - glaucoma  . eyelid surgery Bilateral    for glaucoma  . NOSE SURGERY  12-2006  . OOPHORECTOMY  2001  . SINUS ENDO W/FUSION Bilateral 02/16/2013   Procedure: ENDOSCOPIC SINUS SURGERY WITH FUSION NAVIGATION;  Surgeon: Jerrell Belfast, MD;  Location: [facility] OR;  Service: ENT;  Laterality: Bilateral;    Review of systems negative except as noted in HPI / PMHx or noted below:  Review of Systems  Constitutional: Negative.   HENT: Negative.   Eyes: Negative.   Respiratory: Negative.   Cardiovascular: Negative.   Gastrointestinal: Negative.   Genitourinary: Negative.   Musculoskeletal: Negative.   Skin: Negative.   Neurological: Negative.   Endo/Heme/Allergies: Negative.   Psychiatric/Behavioral: Negative.      Objective:   Vitals:   09/28/17 1718  BP: (!) 152/84  Pulse: 72  Resp: 16  SpO2: 96%   Height: 5\' 2"  (157.5 cm)  Weight: 161 lb (73 kg)   Physical Exam  HENT:  Head: Normocephalic.  Right Ear: Tympanic membrane and external ear normal. A foreign body (Tube) is present.  Left Ear: Tympanic membrane and external ear normal. A foreign body (Tube) is present.  Nose: Nose normal. No mucosal edema or rhinorrhea.  Mouth/Throat: Uvula is midline,  oropharynx is clear and moist and mucous membranes are normal. No oropharyngeal exudate.  Eyes: Conjunctivae are normal.  Neck: Trachea normal. No tracheal tenderness present. No tracheal deviation present. No thyromegaly present.  Cardiovascular: Normal rate, regular rhythm, S1 normal, S2 normal and normal heart sounds.  No murmur heard. Pulmonary/Chest: Breath sounds normal. No stridor. No respiratory distress. She has no wheezes. She has no rales.  Musculoskeletal: She exhibits no edema.  Lymphadenopathy:       Head (right side): No tonsillar adenopathy present.       Head (left side): No tonsillar adenopathy present.    She has no cervical adenopathy.  Neurological: She is alert.  Skin: No rash  noted. She is not diaphoretic. No erythema. Nails show no clubbing.    Diagnostics:    Spirometry was performed and demonstrated an FEV1 of 2.08 at 83 % of predicted.  Assessment and Plan:   1. Asthma, moderate persistent, well-controlled   2. Other allergic rhinitis   3. LPRD (laryngopharyngeal reflux disease)   4. ETD (Eustachian tube dysfunction), bilateral     1. Continue dupilumab every 4 weeks  2. Continue montelukast 10mg  daily  3. Can restart Qnasl 1-2 puffs one time per day during periods of upper airway symptoms   4. Can restart Dulera 200 2 inhalations two times per day during periods of asthma activity  5. Continue dexilant 60mg  in AM plus continue ranitidine 300 in PM   6. Continue cetirizine and albuterol MDI if needed.  7. For this recent issue:   A. Prednisone 10mg  one time per day for 5 days only  B. Nasal saline multiple times per day  C. ibuprofen  8. Revisit with Dr. Redmond Baseman about ear issues / ear tubes   9. Return in 6 months or earlier if problem  Kaylan appears to be doing very well on her current therapy which includes dupilumab.  In fact, she has basically stopped all of her other anti-inflammatory agents for her respiratory tract other than  montelukast.  Reflux also appears to be under good control on her current plan.  I suspect that her recent episode of sore throat and hoarseness and face pain is probably a viral respiratory tract infection and we will treat her with the therapy noted above assuming that she does well over the course of the next week or so.  She can revisit with Dr. Redmond Baseman about her ear issue and the long-term plan for her ear tubes.  I will see her in this clinic in 6 months or earlier if there is a problem.  Allena Katz, MD Allergy / Immunology [place]

## 2017-09-29 ENCOUNTER — Other Ambulatory Visit: Payer: Self-pay | Admitting: *Deleted

## 2017-09-29 ENCOUNTER — Ambulatory Visit: Payer: Medicare Other | Admitting: Family Medicine

## 2017-09-29 ENCOUNTER — Encounter: Payer: Self-pay | Admitting: Allergy and Immunology

## 2017-09-29 MED ORDER — DUPILUMAB 300 MG/2ML ~~LOC~~ SOSY: 300.0000 mg | PREFILLED_SYRINGE | SUBCUTANEOUS | 3 refills | Status: DC

## 2017-09-29 NOTE — Telephone Encounter (Signed)
Patient called requesting refill of Dupixent

## 2017-11-10 ENCOUNTER — Encounter: Payer: Self-pay | Admitting: Allergy and Immunology

## 2018-01-31 ENCOUNTER — Other Ambulatory Visit: Payer: Self-pay | Admitting: Obstetrics and Gynecology

## 2018-01-31 DIAGNOSIS — Z1231 Encounter for screening mammogram for malignant neoplasm of breast: Secondary | ICD-10-CM

## 2018-02-02 ENCOUNTER — Ambulatory Visit
Admission: RE | Admit: 2018-02-02 | Discharge: 2018-02-02 | Disposition: A | Discharge status: Home / Self Care | Payer: Medicare Other | Source: Ambulatory Visit | Attending: Obstetrics and Gynecology | Admitting: Obstetrics and Gynecology

## 2018-02-02 DIAGNOSIS — Z1231 Encounter for screening mammogram for malignant neoplasm of breast: Secondary | ICD-10-CM

## 2018-03-11 IMAGING — MR MR MRA HEAD W/O CM
1 series · 20 of 48 positions shown · non-contrast
Comparison: MRI head 09/15/2016

CLINICAL DATA: TIA

EXAM:
MRA HEAD WITHOUT CONTRAST
TECHNIQUE: Angiographic images of the Circle of Willis were obtained using MRA
technique without intravenous contrast.

[Series 3: ax (id) 2 · axial · 1.0mm · 0.43mm/px · z∈[-44,+42]mm · 20 of 184 slices shown]
[im 1/184]
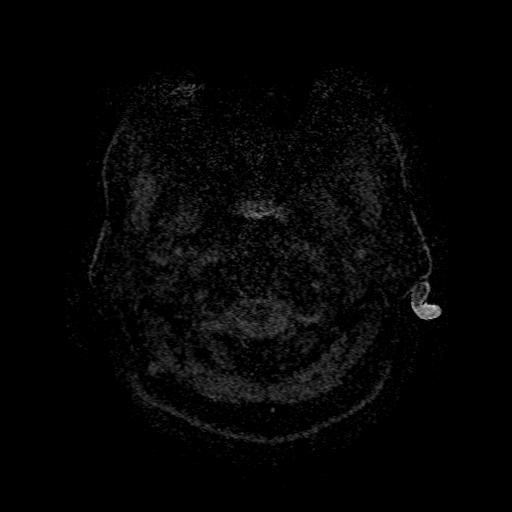
[im 4/184]
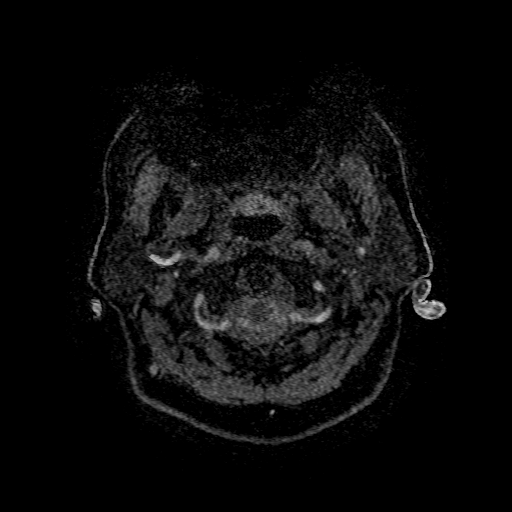
[im 8/184]
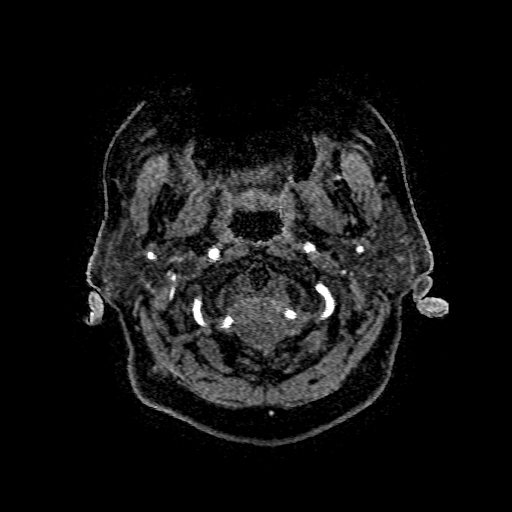
[im 12/184]
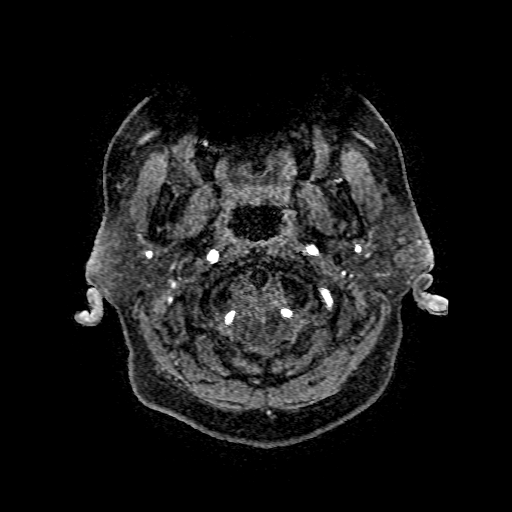
[im 16/184]
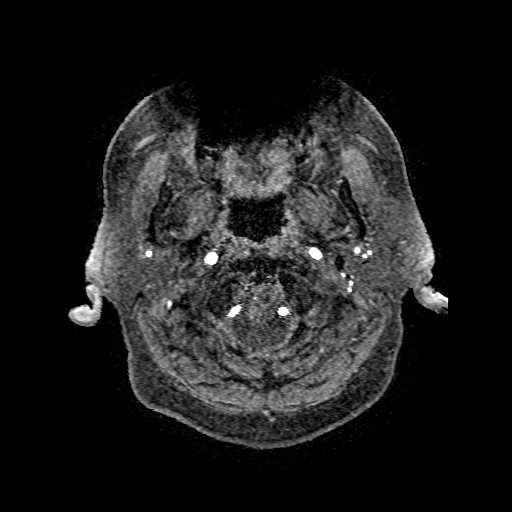
[im 20/184]
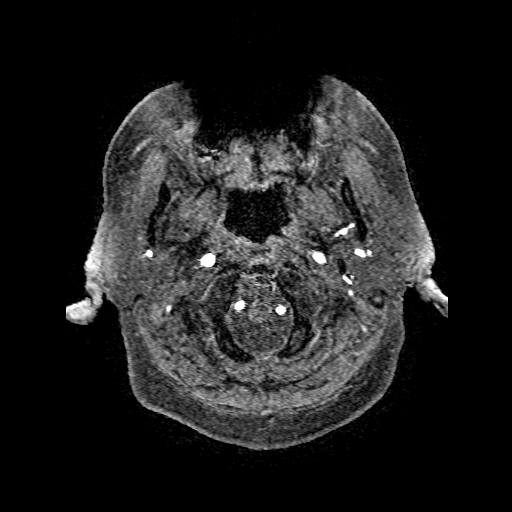
[im 24/184]
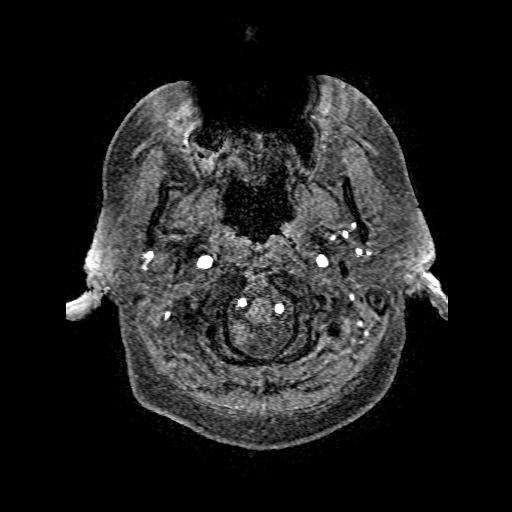
[im 28/184]
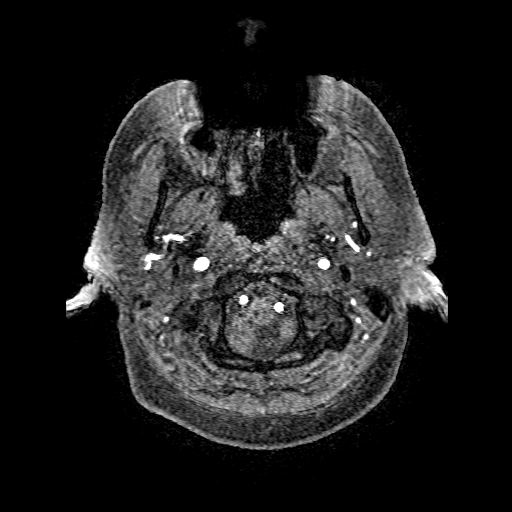
[im 32/184]
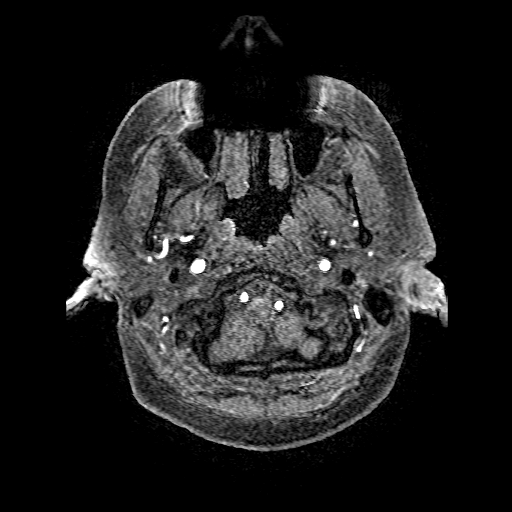
[im 36/184]
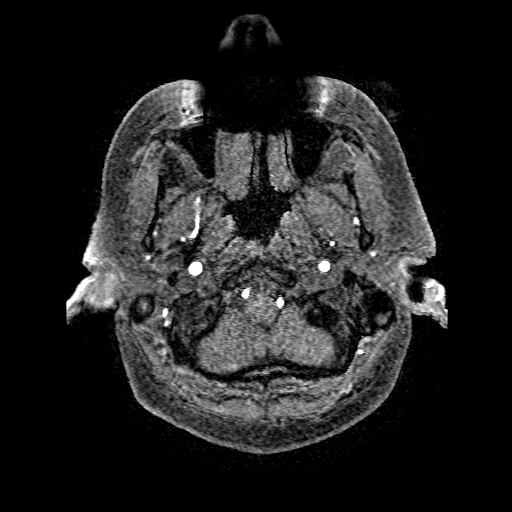
[im 39/184]
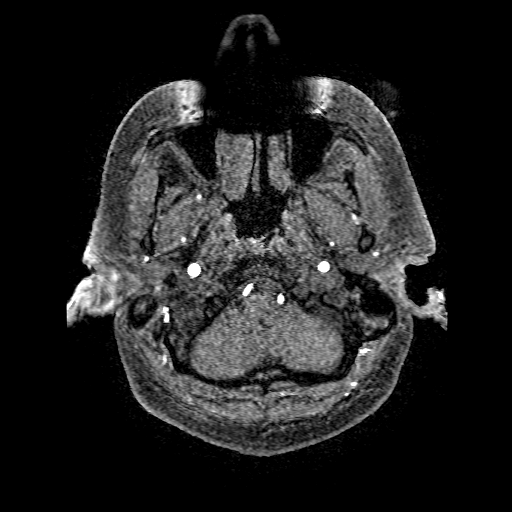
[im 43/184]
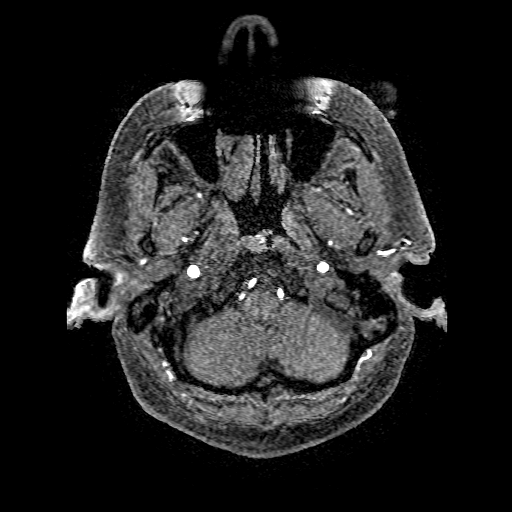
[im 59/184]
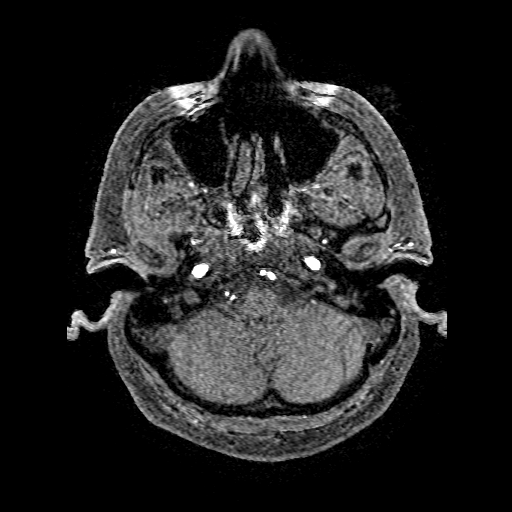
[im 82/184]
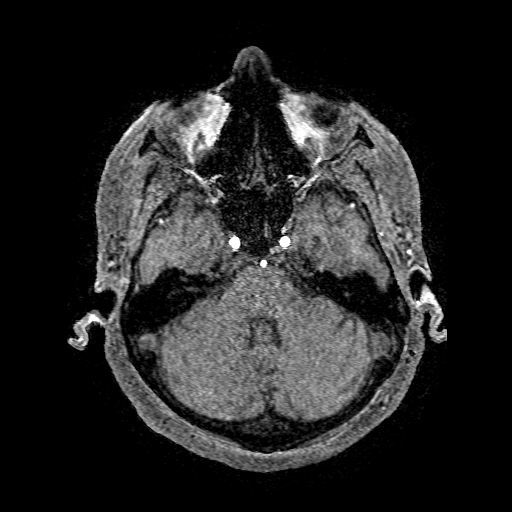
[im 94/184]
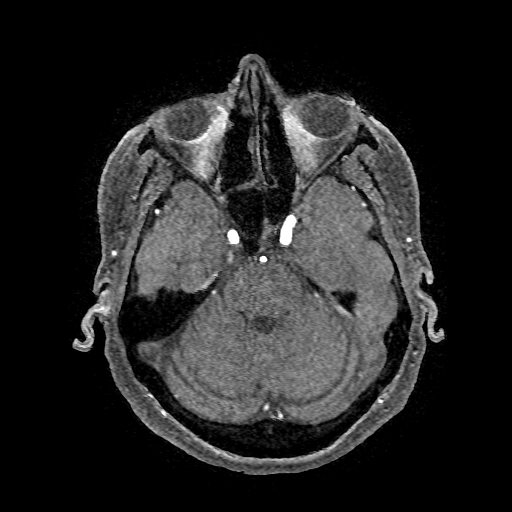
[im 106/184]
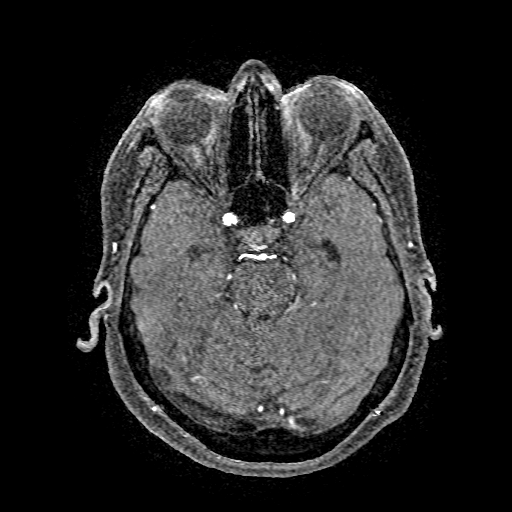
[im 129/184]
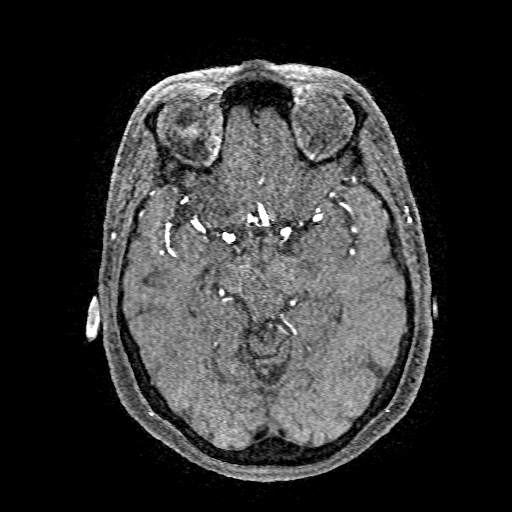
[im 152/184]
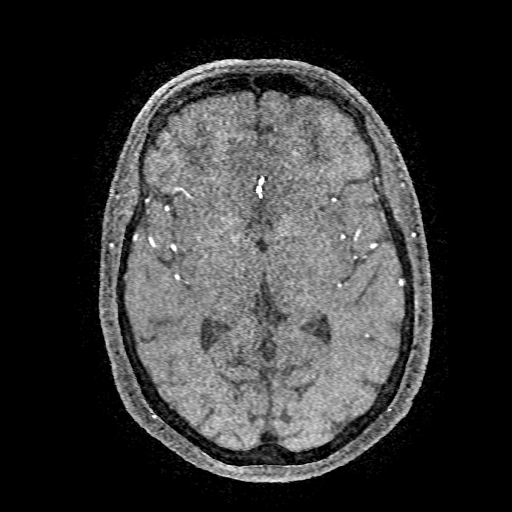
[im 156/184]
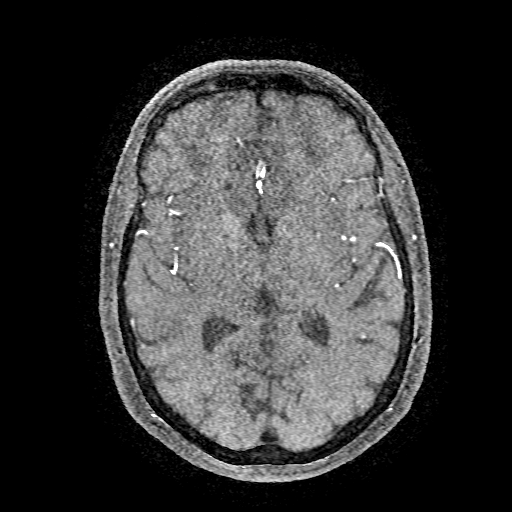
[im 176/184]
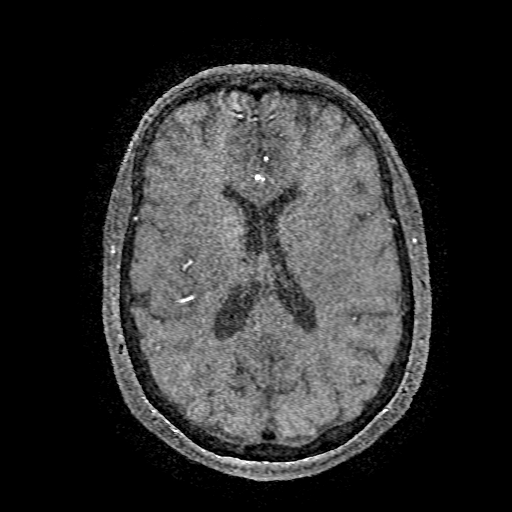

[20 of 48 positions shown; findings below may reference images not displayed]

FINDINGS: Both vertebral arteries widely patent. Right PICA patent. Dominant
left AICA patent. Basilar widely patent. Superior cerebellar and
posterior cerebral arteries patent bilaterally. Fetal origin of the
right posterior cerebral artery with hypoplastic right P1 segment.

Internal carotid artery widely patent bilaterally. Anterior and
middle cerebral arteries normal

Negative for cerebral aneurysm.
IMPRESSION: Negative MRA circle of Willis.

## 2018-04-21 ENCOUNTER — Telehealth: Payer: Self-pay | Admitting: *Deleted

## 2018-04-21 ENCOUNTER — Other Ambulatory Visit: Payer: Self-pay | Admitting: *Deleted

## 2018-04-21 MED ORDER — PREDNISONE 10 MG PO TABS: 10.0000 mg | ORAL_TABLET | Freq: Every day | ORAL | 0 refills | Status: AC

## 2018-04-21 NOTE — Telephone Encounter (Signed)
Prescription has been sent in. Called patient and informed. Advised that if they are not feeling better after completion to please call and inform. Patient verbalized understanding.

## 2018-04-21 NOTE — Telephone Encounter (Signed)
Patient called asking if Dr Neldon Mc can call her in something with predisone, patient said she is hoarse and she gets this way every year.    [facility]   [phone]

## 2018-04-21 NOTE — Telephone Encounter (Signed)
Prednisone 10mg  tablet one time per day for 10 days.

## 2018-04-21 NOTE — Telephone Encounter (Signed)
Dr Kozlow please advise 

## 2018-05-03 ENCOUNTER — Telehealth: Payer: Self-pay | Admitting: Allergy and Immunology

## 2018-05-03 MED ORDER — AZITHROMYCIN 500 MG PO TABS: 500.0000 mg | ORAL_TABLET | Freq: Every day | ORAL | 0 refills | Status: AC

## 2018-05-03 MED ORDER — PREDNISONE 20 MG PO TABS: 20.0000 mg | ORAL_TABLET | Freq: Every day | ORAL | 0 refills | Status: AC

## 2018-05-03 NOTE — Telephone Encounter (Signed)
Called and informed patient that medication have been sent in and instruction. Also informed patient that she is due for an OV as well. Patient will call office on Friday and give [place] an update as well as scheduling her follow up OV.

## 2018-05-03 NOTE — Telephone Encounter (Signed)
Pt called and said that she has a sinus infection pressure around eyes, congestion said that she need something . The zpack always help. [phone] [facility] [place].

## 2018-05-03 NOTE — Telephone Encounter (Signed)
Please inform patient that we can use azithromycin 500 mg once a day for 3 days and prednisone 20 mg once a day for 3 days and keep in contact with [place] noting her response to this approach.

## 2018-05-03 NOTE — Telephone Encounter (Signed)
Called and spoke to patient and she stated that everyone at work has been getting sick and she believes she is too. She stated that she has nasal congestion, sinus pressure and its causing her to be miserable. She is also having headache which she believes is from the pressure. Patient stated she can not be sick here at Christmas and was wondering if there is something that can be called in. Please advise.

## 2018-06-05 ENCOUNTER — Telehealth: Payer: Self-pay | Admitting: Allergy & Immunology

## 2018-06-05 MED ORDER — AZITHROMYCIN 250 MG PO TABS: ORAL_TABLET | ORAL | 0 refills | Status: DC

## 2018-06-05 NOTE — Telephone Encounter (Signed)
Patient called complaining about sinus congestion and pressure for several days. Sent in azithromycin course per patient request. Despite saying she had no allergies to drugs, she continued to say that that she did not tolerate each antibiotic I recommended.   Salvatore Marvel, MD [facility]

## 2018-10-11 ENCOUNTER — Other Ambulatory Visit: Payer: Self-pay | Admitting: *Deleted

## 2018-10-11 MED ORDER — MONTELUKAST SODIUM 10 MG PO TABS: 10.0000 mg | ORAL_TABLET | Freq: Every day | ORAL | 0 refills | Status: DC

## 2018-10-11 MED ORDER — DUPILUMAB 300 MG/2ML ~~LOC~~ SOSY
300.0000 mg | PREFILLED_SYRINGE | SUBCUTANEOUS | 3 refills | Status: DC
Start: 2018-10-11 — End: 2020-02-07

## 2019-02-06 ENCOUNTER — Other Ambulatory Visit: Payer: Self-pay | Admitting: Obstetrics and Gynecology

## 2019-02-06 DIAGNOSIS — Z1231 Encounter for screening mammogram for malignant neoplasm of breast: Secondary | ICD-10-CM

## 2019-02-15 ENCOUNTER — Ambulatory Visit

## 2019-04-21 ENCOUNTER — Ambulatory Visit (INDEPENDENT_AMBULATORY_CARE_PROVIDER_SITE_OTHER): Payer: Medicare Other | Admitting: Family Medicine

## 2019-04-21 ENCOUNTER — Other Ambulatory Visit: Payer: Self-pay

## 2019-04-21 ENCOUNTER — Encounter: Payer: Self-pay | Admitting: Family Medicine

## 2019-04-21 VITALS — BP 140/88 | HR 91 | Temp 98.3°F | Resp 18 | Ht 61.75 in | Wt 172.4 lb

## 2019-04-21 DIAGNOSIS — J4540 Moderate persistent asthma, uncomplicated: Secondary | ICD-10-CM | POA: Diagnosis not present

## 2019-04-21 DIAGNOSIS — J0100 Acute maxillary sinusitis, unspecified: Secondary | ICD-10-CM | POA: Diagnosis not present

## 2019-04-21 DIAGNOSIS — K219 Gastro-esophageal reflux disease without esophagitis: Secondary | ICD-10-CM | POA: Insufficient documentation

## 2019-04-21 DIAGNOSIS — J3089 Other allergic rhinitis: Secondary | ICD-10-CM

## 2019-04-21 MED ORDER — AZITHROMYCIN 250 MG PO TABS: ORAL_TABLET | ORAL | 0 refills | Status: DC

## 2019-04-21 MED ORDER — EPINEPHRINE 0.3 MG/0.3ML IJ SOAJ: INTRAMUSCULAR | 1 refills | Status: DC

## 2019-04-21 MED ORDER — DEXILANT 60 MG PO CPDR: 60.0000 mg | DELAYED_RELEASE_CAPSULE | Freq: Every day | ORAL | 1 refills | Status: DC

## 2019-04-21 NOTE — Patient Instructions (Addendum)
Asthma Continue Dupixent injections at home once every 2 weeks to prevent cough or wheeze Continue montelukast 10 mg once a day to prevent cough and wheeze Continue albuterol 2 puffs every 4 hours as needed for cough or wheeze For asthma flare, begin Dulera-2 puffs twice a day with a spacer for 2 weeks  Allergic rhinitis Begin Qnasl 2 puffs in each nostril twice a day Continue saline rinses daily as needed for nasal symptoms  Acute sinusitis Continue treatment for allergic rhinitis Begin prednisone 10 mg tablets. Take 2 tablets twice a day for 3 days, then take 2 tablets once a day for 1 day, then take 1 tablet on the 5th day, then stop Begin azithromycin 250 mg. Take 2 tablets on the first day, then take 1 tablet once a day for the next 4 days  Reflux Continue dietary and lifestyle modifications as listed below Follow up with your primary care provider for a medication that will be compatible with your new thyroid medication Call the clinic if this treatment plan is not working well for you  Follow up in 2 months or sooner if needed   Lifestyle Changes for Controlling GERD When you have GERD, stomach acid feels as if it's backing up toward your mouth. Whether or not you take medication to control your GERD, your symptoms can often be improved with lifestyle changes.   Raise Your Head  Reflux is more likely to strike when you're lying down flat, because stomach fluid can  flow backward more easily. Raising the head of your bed 4-6 inches can help. To do this:  Slide blocks or books under the legs at the head of your bed. Or, place a wedge under  the mattress. Many foam stores can make a suitable wedge for you. The wedge  should run from your waist to the top of your head.  Don't just prop your head on several pillows. This increases pressure on your  stomach. It can make GERD worse.  Watch Your Eating Habits Certain foods may increase the acid in your stomach or relax the  lower esophageal sphincter, making GERD more likely. It's best to avoid the following:  Coffee, tea, and carbonated drinks (with and without caffeine)  Fatty, fried, or spicy food  Mint, chocolate, onions, and tomatoes  Any other foods that seem to irritate your stomach or cause you pain  Relieve the Pressure  Eat smaller meals, even if you have to eat more often.  Don't lie down right after you eat. Wait a few hours for your stomach to empty.  Avoid tight belts and tight-fitting clothes.  Lose excess weight.  Tobacco and Alcohol  Avoid smoking tobacco and drinking alcohol. They can make GERD symptoms worse.

## 2019-04-21 NOTE — Progress Notes (Signed)
[facility] [place] [address] Dept: [phone]  FOLLOW UP NOTE  Patient ID: TAMI ALBERTS, female    DOB: [birth year]  Age: 68 y.o. MRN: [id number] Date of Office Visit: 04/21/2019  Assessment  Chief Complaint: Sinusitis  HPI [name] is a 68 year old female who presents to the clinic for an evaluation of acute sinusitis. She was last seen in this clinic on 09/28/2017 by Dr. Neldon Mc for evaluation of asthma, allergic rhinitis, and reflux. At today's visit, she reports that she began to experience symptoms including headache, bilateral upper jaw/teeth pain, ear popping and fullness. She denies any drainage, fever, or sick contacts. She is currently using a saline nasal rinse as needed for nasal symptoms. Asthma is reported as well controlled with occasional shortness of breath with vigorous activity, no wheeze, or cough. She continues Dupixent home administration once every 2 weeks with a significant improvement in her asthma symptoms while continuing on Dupixent. She continues montelukast 20 mg once a day and uses albuterol less than once a month. She is no longer using Dulera. Reflux is reported as not well controlled with frequent heartburn. She has historically taken ranitidine and currently takes Dexilant. She has recently started taking levothyroxine. Her current medications are listed in the chart.    Drug Allergies:  Allergies  Allergen Reactions  . Sulfa Antibiotics Itching  . Amoxicillin-Pot Clavulanate Nausea And Vomiting  . Doxycycline Nausea And Vomiting  . Nitrofurantoin Macrocrystal Other (See Comments)    Edema Edema  . Pregabalin Other (See Comments)    Blurry vision Blurry vision    Physical Exam: BP 140/88   Pulse 91   Temp 98.3 F (36.8 C) (Temporal)   Resp 18   Ht 5' 1.75" (1.568 m)   Wt 172 lb 6.4 oz (78.2 kg)   SpO2 97%   BMI 31.79 kg/m    Physical Exam Vitals signs reviewed.  Constitutional:      Appearance: Normal appearance.   HENT:     Head: Normocephalic and atraumatic.     Right Ear: Tympanic membrane normal.     Left Ear: Tympanic membrane normal.     Nose:     Comments: Bilateral nares slightly erythematous with no nasal drainage noted. Pharynx slightly erythematous with no exudate noted. Ears normal. Eyes normal. Eyes:     Conjunctiva/sclera: Conjunctivae normal.  Neck:     Musculoskeletal: Normal range of motion and neck supple.  Cardiovascular:     Rate and Rhythm: Normal rate and regular rhythm.     Heart sounds: Normal heart sounds. No murmur.  Pulmonary:     Effort: Pulmonary effort is normal.     Breath sounds: Normal breath sounds.     Comments: Lungs clear to auscultation Musculoskeletal: Normal range of motion.  Skin:    General: Skin is warm and dry.  Neurological:     Mental Status: She is alert and oriented to person, place, and time.  Psychiatric:        Mood and Affect: Mood normal.        Behavior: Behavior normal.        Thought Content: Thought content normal.        Judgment: Judgment normal.     Diagnostics: FVC 2.77, FEV1 2.14. Predicted FVC 2.83, predicted FEV1 2.15. Spirometry indicates normal ventilatory function.  Assessment and Plan: 1. Asthma, moderate persistent, well-controlled   2. Other allergic rhinitis   3. LPRD (laryngopharyngeal reflux disease)   4.  Acute maxillary sinusitis, recurrence not specified     No orders of the defined types were placed in this encounter.   Patient Instructions  Asthma Continue Dupixent injections at home once every 2 weeks to prevent cough or wheeze Continue montelukast 10 mg once a day to prevent cough and wheeze Continue albuterol 2 puffs every 4 hours as needed for cough or wheeze For asthma flare, begin Dulera-2 puffs twice a day with a spacer for 2 weeks  Allergic rhinitis Begin Qnasl 2 puffs in each nostril twice a day Continue saline rinses daily as needed for nasal symptoms  Acute sinusitis Continue treatment  for allergic rhinitis Begin prednisone 10 mg tablets. Take 2 tablets twice a day for 3 days, then take 2 tablets once a day for 1 day, then take 1 tablet on the 5th day, then stop Begin azithromycin 250 mg. Take 2 tablets on the first day, then take 1 tablet once a day for the next 4 days  Reflux Continue dietary and lifestyle modifications as listed below Follow up with your primary care provider for a medication that will be compatible with your new thyroid medication Call the clinic if this treatment plan is not working well for you  Follow up in 2 months or sooner if needed   Lifestyle Changes for Controlling GERD When you have GERD, stomach acid feels as if it's backing up toward your mouth. Whether or not you take medication to control your GERD, your symptoms can often be improved with lifestyle changes.   Raise Your Head  Reflux is more likely to strike when you're lying down flat, because stomach fluid can  flow backward more easily. Raising the head of your bed 4-6 inches can help. To do this:  Slide blocks or books under the legs at the head of your bed. Or, place a wedge under  the mattress. Many foam stores can make a suitable wedge for you. The wedge  should run from your waist to the top of your head.  Don't just prop your head on several pillows. This increases pressure on your  stomach. It can make GERD worse.  Watch Your Eating Habits Certain foods may increase the acid in your stomach or relax the lower esophageal sphincter, making GERD more likely. It's best to avoid the following:  Coffee, tea, and carbonated drinks (with and without caffeine)  Fatty, fried, or spicy food  Mint, chocolate, onions, and tomatoes  Any other foods that seem to irritate your stomach or cause you pain  Relieve the Pressure  Eat smaller meals, even if you have to eat more often.  Don't lie down right after you eat. Wait a few hours for your stomach to empty.  Avoid  tight belts and tight-fitting clothes.  Lose excess weight.  Tobacco and Alcohol  Avoid smoking tobacco and drinking alcohol. They can make GERD symptoms worse.    Return in about 2 months (around 06/22/2019), or if symptoms worsen or fail to improve.    Thank you for the opportunity to care for this patient.  Please do not hesitate to contact me with questions.  Gareth Morgan, FNP [facility] [place] [facility]

## 2019-05-01 ENCOUNTER — Other Ambulatory Visit: Payer: Self-pay | Admitting: Family Medicine

## 2019-05-01 ENCOUNTER — Telehealth: Payer: Self-pay | Admitting: Family Medicine

## 2019-05-01 DIAGNOSIS — J0100 Acute maxillary sinusitis, unspecified: Secondary | ICD-10-CM

## 2019-05-01 MED ORDER — LEVOFLOXACIN 500 MG PO TABS: 500.0000 mg | ORAL_TABLET | Freq: Every day | ORAL | 0 refills | Status: AC

## 2019-05-01 MED ORDER — PREDNISONE 10 MG PO TABS: ORAL_TABLET | ORAL | 0 refills | Status: DC

## 2019-05-01 NOTE — Telephone Encounter (Signed)
Spoke with patient. Patient reports that she is still having green drainage that is draining dpwn the back of her throat and she feels "lousy". She reports that she did go to have a COVID test and it was negative. She reports that she is using Qnasl 2 puffs twice daily and that she is doing nasal saline rinses twice daily. Please advise.

## 2019-05-01 NOTE — Progress Notes (Signed)
Patient reports sinus pressure, headache, and thick green drainage for over 10 days. Azithromycin provided some relief however, she continues to experience symtpoms. She verbalizes understanding of possible side effects of taking levofloxacin including but not limited to tendon rupture and risk of contracting C diff. She will call with any questions or worsening of symptoms.

## 2019-05-01 NOTE — Telephone Encounter (Signed)
Patient saw Gareth Morgan on 04-21-19 and was given an antibiotic. She said it did not help and said it was discussed about putting her on Avalox. Patient said she feels that this would work better than a Z-pack. [facility].

## 2019-05-31 ENCOUNTER — Telehealth: Payer: Self-pay | Admitting: Allergy and Immunology

## 2019-05-31 MED ORDER — MONTELUKAST SODIUM 10 MG PO TABS: 10.0000 mg | ORAL_TABLET | Freq: Every day | ORAL | 1 refills | Status: DC

## 2019-05-31 MED ORDER — DULERA 200-5 MCG/ACT IN AERO: 2.0000 | INHALATION_SPRAY | Freq: Two times a day (BID) | RESPIRATORY_TRACT | 1 refills | Status: DC

## 2019-05-31 MED ORDER — ALBUTEROL SULFATE HFA 108 (90 BASE) MCG/ACT IN AERS: 2.0000 | INHALATION_SPRAY | Freq: Four times a day (QID) | RESPIRATORY_TRACT | 0 refills | Status: DC | PRN

## 2019-05-31 NOTE — Telephone Encounter (Signed)
Patient notified

## 2019-05-31 NOTE — Telephone Encounter (Signed)
Medications sent to [facility]

## 2019-05-31 NOTE — Telephone Encounter (Signed)
Pt called and needs to have her inhalers called in to [facility]. [phone]

## 2019-07-12 ENCOUNTER — Other Ambulatory Visit: Payer: Self-pay

## 2019-07-12 ENCOUNTER — Ambulatory Visit
Admission: RE | Admit: 2019-07-12 | Discharge: 2019-07-12 | Disposition: A | Discharge status: Home / Self Care | Payer: Medicare Other | Source: Ambulatory Visit | Attending: Obstetrics and Gynecology | Admitting: Obstetrics and Gynecology

## 2019-07-12 DIAGNOSIS — Z1231 Encounter for screening mammogram for malignant neoplasm of breast: Secondary | ICD-10-CM

## 2019-07-14 ENCOUNTER — Ambulatory Visit: Payer: Medicare Other | Attending: Internal Medicine

## 2019-07-14 DIAGNOSIS — Z23 Encounter for immunization: Secondary | ICD-10-CM | POA: Insufficient documentation

## 2019-07-14 NOTE — Progress Notes (Signed)
   Covid-19 Vaccination Clinic  Name:  [name]    MRN: [id number] DOB: Sep 29, 1950  07/14/2019  [name] was observed post Covid-19 immunization for 15 minutes without incidence. She was provided with Vaccine Information Sheet and instruction to access the V-Safe system.   [name] was instructed to call 911 with any severe reactions post vaccine: Marland Kitchen Difficulty breathing  . Swelling of your face and throat  . A fast heartbeat  . A bad rash all over your body  . Dizziness and weakness    Immunizations Administered    Name Date Dose VIS Date Route   Pfizer COVID-19 Vaccine 07/14/2019  2:12 PM 0.3 mL 04/28/2019 Intramuscular   Manufacturer: [place]   Lot: [id number]   [place]: [id number]

## 2019-07-19 ENCOUNTER — Telehealth: Payer: Self-pay | Admitting: Allergy and Immunology

## 2019-07-19 ENCOUNTER — Other Ambulatory Visit: Payer: Self-pay | Admitting: *Deleted

## 2019-07-19 MED ORDER — BENZONATATE 100 MG PO CAPS: 100.0000 mg | ORAL_CAPSULE | Freq: Three times a day (TID) | ORAL | 1 refills | Status: DC | PRN

## 2019-07-19 NOTE — Telephone Encounter (Signed)
Called and spoke with the patient and she has been having congestion and cough for one week now. She states that she resides with her husband who does smoke in the house and bedroom and that does further agitate her symptoms. She has been using her prescribed inhaler, singulair, and nasal spray as prescribed but it still not having much relief. She is wondering if she needs some Prednisone and Tessalon pearls to help with her cough and congestion. To her knowledge she has not been around anyone with COVID and she denies any fever, chills, nausea, vomiting, or body aches. Please advise.

## 2019-07-19 NOTE — Telephone Encounter (Signed)
Patient returned [name]'s phone call. Informed patient a nurse would give her a call after lunch. Patient verbalized understanding.

## 2019-07-19 NOTE — Telephone Encounter (Signed)
Can you please have her continue the Dulera 200-2 puffs twice a day with a spacer, nasal saline rinses twice a day followed by Qnasl, and add on Qvar 80-2 puffs twice a day and tesalon perles 100 mg three times a day as needed for cough. If this does not improve her symptoms or her symptoms worsen, please have her call the clinic. Thank you

## 2019-07-19 NOTE — Telephone Encounter (Signed)
Called and advised to patient and she verbalized understanding. Patient was wondering if she could have some Diflucan to help with yeast prevention in her mouth from using the inhalers. She states even with rinsing she experiences these issues from time to time. She did ask that if you want to prescribe this could she have 3-4 tablets sent in so that her insurance will cover it better.

## 2019-07-19 NOTE — Telephone Encounter (Signed)
Left message to discuss about cough and medication

## 2019-07-19 NOTE — Telephone Encounter (Signed)
Patient called and would like prednisone called into [facility] [address] in [place]. Patient states that she is congested and would like to get on top of it. Patient is using her inhaler, Singulair and nasal spray. Patient also would like to know if pearls would help at night with her coughing. Patient states that her cough isn't constant but aggravating.   Please advise.

## 2019-07-20 ENCOUNTER — Other Ambulatory Visit: Payer: Self-pay | Admitting: *Deleted

## 2019-07-20 MED ORDER — FLUCONAZOLE 150 MG PO TABS: ORAL_TABLET | ORAL | 0 refills | Status: DC

## 2019-07-20 NOTE — Telephone Encounter (Signed)
Thank you :)

## 2019-07-20 NOTE — Telephone Encounter (Signed)
Can you please encourage her to use her spacer to greatly reduce her risk of oral candidiasis? Can you please call in diflucan 150 mg tablet once now and one in 7 days. Then we will take it from there. If she continues to get thrush even with good spacer and rinsing techinque, we can consider a nystatin swish and swallow on a more regular basis. If her breathing is not improved in a few days, can you please have her make an appointment for evaluation. This can be telephone or in person (prefer in person though). Thank you!

## 2019-07-20 NOTE — Telephone Encounter (Signed)
Diflucan has been sent in. Called patient advised. Patient verbalized understanding and seemed a bit frustrated that she did not get prescribed Prednisone and stated that if she is not feeling better she will want to come in and be seen. Advised to patient that if she is not feeling better by tomorrow to call and we will schedule for her to come in and be seen.

## 2019-08-09 ENCOUNTER — Ambulatory Visit: Payer: Medicare Other | Attending: Internal Medicine

## 2019-08-09 DIAGNOSIS — Z23 Encounter for immunization: Secondary | ICD-10-CM

## 2019-08-09 NOTE — Progress Notes (Signed)
   Covid-19 Vaccination Clinic  Name:  [name]    MRN: [id number] DOB: 17-Apr-1951  08/09/2019  [name] was observed post Covid-19 immunization for 15 minutes without incident. She was provided with Vaccine Information Sheet and instruction to access the V-Safe system.   [name] was instructed to call 911 with any severe reactions post vaccine: Marland Kitchen Difficulty breathing  . Swelling of face and throat  . A fast heartbeat  . A bad rash all over body  . Dizziness and weakness   Immunizations Administered    Name Date Dose VIS Date Route   Pfizer COVID-19 Vaccine 08/09/2019  1:51 PM 0.3 mL 04/28/2019 Intramuscular   Manufacturer: [place]   Lot: [id number]   [place]: [id number]

## 2019-08-21 ENCOUNTER — Other Ambulatory Visit: Payer: Self-pay

## 2019-08-21 ENCOUNTER — Encounter: Payer: Self-pay | Admitting: Allergy

## 2019-08-21 ENCOUNTER — Ambulatory Visit (INDEPENDENT_AMBULATORY_CARE_PROVIDER_SITE_OTHER): Payer: Medicare Other | Admitting: Allergy

## 2019-08-21 DIAGNOSIS — J4540 Moderate persistent asthma, uncomplicated: Secondary | ICD-10-CM

## 2019-08-21 DIAGNOSIS — K219 Gastro-esophageal reflux disease without esophagitis: Secondary | ICD-10-CM | POA: Diagnosis not present

## 2019-08-21 DIAGNOSIS — R42 Dizziness and giddiness: Secondary | ICD-10-CM

## 2019-08-21 DIAGNOSIS — J3089 Other allergic rhinitis: Secondary | ICD-10-CM | POA: Diagnosis not present

## 2019-08-21 MED ORDER — PREDNISONE 10 MG PO TABS: 10.0000 mg | ORAL_TABLET | Freq: Every day | ORAL | 0 refills | Status: AC

## 2019-08-21 NOTE — Patient Instructions (Addendum)
May take sudafed as needed.  May use over the counter antihistamines such as Zyrtec (cetirizine), Claritin (loratadine), Allegra (fexofenadine) daily as needed.  Take prednisone 10mg  daily for 5 days.  Take Qnasl 2 puffs twice a day.   If not better in a few days, please follow up with PCP or [place] in person for further evaluation.   Asthma: . Daily controller medication(s): Dupixent every 2 weeks. o Montelukast 10mg  daily. . Prior to physical activity: May use albuterol rescue inhaler 2 puffs 5 to 15 minutes prior to strenuous physical activities. Marland Kitchen Rescue medications: May use albuterol rescue inhaler 2 puffs or nebulizer every 4 to 6 hours as needed for shortness of breath, chest tightness, coughing, and wheezing. Monitor frequency of use.  . During upper respiratory infections/asthma flares: Start Dulera 200 2 puffs twice a day with spacer and rinse mouth afterwards for 2 weeks.  . Asthma control goals:  o Full participation in all desired activities (may need albuterol before activity) o Albuterol use two times or less a week on average (not counting use with activity) o Cough interfering with sleep two times or less a month o Oral steroids no more than once a year o No hospitalizations  Environmental allergies:  Continue environmental control measures.  Continue Qnasl 1-2 sprays per nostril daily as needed.   Follow up in 1 month for your asthma, allergic rhinitis.

## 2019-08-21 NOTE — Progress Notes (Signed)
RE: [name] MRN: [id number] DOB: 14-Jul-1950 Date of Telemedicine Visit: 08/21/2019  Referring provider: Nena Polio, NP Primary care provider: Nena Polio, NP  Chief Complaint: vertigo (feels like the room is spinning, has the meclizine, not helping) and Asthma (doing okay)   Telemedicine Follow Up Visit via Telephone: I connected with [name] for a follow up on 08/21/19 by telephone and verified that I am speaking with the correct person using two identifiers.   I discussed the limitations, risks, security and privacy concerns of performing an evaluation and management service by telephone and the availability of in person appointments. I also discussed with the patient that there may be a patient responsible charge related to this service. The patient expressed understanding and agreed to proceed.  Patient is at home. Provider is at the office.  Visit start time: 3:28PM Visit end time: 3:45PM Insurance consent/check in by: front desk Medical consent and medical assistant/nurse: Lisabeth Pick S.  History of Present Illness: She is a 69 y.o. female, who is being followed for asthma, allergic rhinitis, reflux. Her previous allergy office visit was on 04/21/2019 with Gareth Morgan, [place]. Today is a new complaint visit of vertigo.  Dizziness:  Patient felt like the room was spinning when she sat up yesterday. She took some meclizine she had leftover from her previous vertigo episode with no benefit. She actually thinks that it made it worse.  She wants to know if she can have some prednisone because that helped her the last time.  She is having some nasal congestion and using Qnasl 2 puffs twice a day. Not taking any additional antihistamines or decongestants.  Denies any issues with her blood pressure.   Patient has to go take her husband to the [place] on Wednesday for his appointments and is very concerned about this dizziness.   Assessment and Plan: [name] is a 69 y.o. female  with: Dizziness Dizziness with room spinning for 2 days. Tried meclizine with no benefit. Feels like her previous vertigo episodes which apparently required prednisone to resolve. Denies any issues with her blood pressure or heart. Patient takes care of husband with dementia and needs to take him to his [place] appointment on Wednesday and is worried about this dizziness. Complains of some nasal congestion as well.   Discussed with patient at length that this type of clinical history is best assessed in person as we can check blood pressure, heart rate and look int her ears.   Her dizziness may be exacerbated by eustachian tube dysfunction given nasal congestion symptoms.   May take sudafed as needed.  May use over the counter antihistamines such as Zyrtec (cetirizine), Claritin (loratadine), Allegra (fexofenadine) daily as needed.  Take prednisone 10mg  daily for 5 days.  Take Qnasl 2 sprays per nostril twice a day for the next 1-2 weeks. Then use it as needed.   If symptoms not improved within the next few days then advised to follow up   with a medical provider in person - either PCP or our office for further evaluation.   Asthma, well controlled Stable. . Daily controller medication(s): Dupixent every 2 weeks. o Montelukast 10mg  daily. . Prior to physical activity: May use albuterol rescue inhaler 2 puffs 5 to 15 minutes prior to strenuous physical activities. Marland Kitchen Rescue medications: May use albuterol rescue inhaler 2 puffs or nebulizer every 4 to 6 hours as needed for shortness of breath, chest tightness, coughing, and wheezing. Monitor frequency of use.  . During upper respiratory  infections/asthma flares: Start Dulera 200 2 puffs twice a day with spacer and rinse mouth afterwards for 2 weeks.   Other allergic rhinitis  Continue environmental control measures.  Continue Qnasl 1-2 sprays per nostril daily as needed.   Return in about 4 weeks (around 09/18/2019).  Meds ordered this  encounter  Medications  . predniSONE (DELTASONE) 10 MG tablet    Sig: Take 1 tablet (10 mg total) by mouth daily with breakfast for 5 days.    Dispense:  5 tablet    Refill:  0   Diagnostics: None.  Medication List:  Current Outpatient Medications  Medication Sig Dispense Refill  . acetaminophen (TYLENOL) 500 MG tablet Take 1,000 mg by mouth every 6 (six) hours as needed for pain. Reported on 05/15/2015    . albuterol (VENTOLIN HFA) 108 (90 Base) MCG/ACT inhaler Inhale 2 puffs into the lungs every 6 (six) hours as needed for wheezing or shortness of breath. 54 g 0  . ALPRAZolam (XANAX) 0.5 MG tablet Take 0.5 mg by mouth at bedtime as needed for sleep.     Marland Kitchen aspirin EC 81 MG EC tablet Take 1 tablet (81 mg total) by mouth daily. 30 tablet 0  . atorvastatin (LIPITOR) 20 MG tablet Take 1 tablet (20 mg total) by mouth daily at 6 PM. 30 tablet 0  . Beclomethasone Dipropionate (QNASL) 80 MCG/ACT AERS Place 1-2 sprays into the nose daily as needed (allergies). Reported on 05/15/2015 26.1 g 3  . brimonidine (ALPHAGAN) 0.2 % ophthalmic solution Place 1 drop into both eyes every morning.    Marland Kitchen dexlansoprazole (DEXILANT) 60 MG capsule Take 1 capsule (60 mg total) by mouth daily. 90 capsule 1  . Dupilumab (DUPIXENT) 300 MG/2ML SOSY Inject 300 mg into the skin every 14 (fourteen) days. 6 Syringe 3  . EPINEPHrine (EPIPEN 2-PAK) 0.3 mg/0.3 mL IJ SOAJ injection Use for life threatening allergic reaction 2 each 1  . escitalopram (LEXAPRO) 20 MG tablet Take 20 mg by mouth daily.    Marland Kitchen latanoprost (XALATAN) 0.005 % ophthalmic solution Place 1 drop into both eyes every morning.     Marland Kitchen levothyroxine (SYNTHROID) 25 MCG tablet Take by mouth.    . montelukast (SINGULAIR) 10 MG tablet Take 1 tablet (10 mg total) by mouth at bedtime. 90 tablet 1  . mometasone-formoterol (DULERA) 200-5 MCG/ACT AERO Inhale 2 puffs into the lungs 2 (two) times daily. (Patient not taking: Reported on 08/21/2019) 39 g 1  . predniSONE  (DELTASONE) 10 MG tablet Take 1 tablet (10 mg total) by mouth daily with breakfast for 5 days. 5 tablet 0   No current facility-administered medications for this visit.   Allergies: Allergies  Allergen Reactions  . Sulfa Antibiotics Itching  . Amoxicillin-Pot Clavulanate Nausea And Vomiting  . Doxycycline Nausea And Vomiting  . Nitrofurantoin Macrocrystal Other (See Comments)    Edema Edema  . Pregabalin Other (See Comments)    Blurry vision Blurry vision   I reviewed her past medical history, social history, family history, and environmental history and no significant changes have been reported from her previous visit.  Review of Systems  Constitutional: Negative for appetite change, chills, fever and unexpected weight change.  HENT: Positive for congestion. Negative for rhinorrhea.   Eyes: Negative for itching.  Respiratory: Negative for cough, chest tightness, shortness of breath and wheezing.   Gastrointestinal: Negative for abdominal pain.  Skin: Negative for rash.  Allergic/Immunologic: Positive for environmental allergies.  Neurological: Positive for dizziness.   Objective:  Physical Exam Not obtained as encounter was done via telephone.   Previous notes and tests were reviewed.  I discussed the assessment and treatment plan with the patient. The patient was provided an opportunity to ask questions and all were answered. The patient agreed with the plan and demonstrated an understanding of the instructions. After visit summary/patient instructions available via mychart.   The patient was advised to call back or seek an in-person evaluation if the symptoms worsen or if the condition fails to improve as anticipated.  I provided 17 minutes of non-face-to-face time during this encounter.  It was my pleasure to participate in [name] care today. Please feel free to contact me with any questions or concerns.   Sincerely,  Rexene Alberts, DO Allergy & Immunology  [facility] office: [phone] [facility] office: [place] office: [phone]

## 2019-08-21 NOTE — Assessment & Plan Note (Signed)
   Continue environmental control measures.  Continue Qnasl 1-2 sprays per nostril daily as needed.

## 2019-08-21 NOTE — Assessment & Plan Note (Addendum)
Dizziness with room spinning for 2 days. Tried meclizine with no benefit. Feels like her previous vertigo episodes which apparently required prednisone to resolve. Denies any issues with her blood pressure or heart. Patient takes care of husband with dementia and needs to take him to his [place] appointment on Wednesday and is worried about this dizziness. Complains of some nasal congestion as well.   Discussed with patient at length that this type of clinical history is best assessed in person as we can check blood pressure, heart rate and look int her ears.   Her dizziness may be exacerbated by eustachian tube dysfunction given nasal congestion symptoms.   May take sudafed as needed.  May use over the counter antihistamines such as Zyrtec (cetirizine), Claritin (loratadine), Allegra (fexofenadine) daily as needed.  Take prednisone 10mg  daily for 5 days.  Take Qnasl 2 sprays per nostril twice a day for the next 1-2 weeks. Then use it as needed.   If symptoms not improved within the next few days then advised to follow up   with a medical provider in person - either PCP or our office for further evaluation.

## 2019-08-21 NOTE — Assessment & Plan Note (Signed)
Stable. . Daily controller medication(s): Dupixent every 2 weeks. o Montelukast 10mg  daily. . Prior to physical activity: May use albuterol rescue inhaler 2 puffs 5 to 15 minutes prior to strenuous physical activities. Marland Kitchen Rescue medications: May use albuterol rescue inhaler 2 puffs or nebulizer every 4 to 6 hours as needed for shortness of breath, chest tightness, coughing, and wheezing. Monitor frequency of use.  . During upper respiratory infections/asthma flares: Start Dulera 200 2 puffs twice a day with spacer and rinse mouth afterwards for 2 weeks.

## 2019-09-26 ENCOUNTER — Ambulatory Visit (INDEPENDENT_AMBULATORY_CARE_PROVIDER_SITE_OTHER): Payer: Medicare Other | Admitting: Allergy and Immunology

## 2019-09-26 ENCOUNTER — Other Ambulatory Visit: Payer: Self-pay

## 2019-09-26 ENCOUNTER — Encounter: Payer: Self-pay | Admitting: Allergy and Immunology

## 2019-09-26 VITALS — BP 130/80 | HR 75 | Temp 98.1°F | Resp 16 | Ht 61.5 in | Wt 176.8 lb

## 2019-09-26 DIAGNOSIS — K219 Gastro-esophageal reflux disease without esophagitis: Secondary | ICD-10-CM | POA: Diagnosis not present

## 2019-09-26 DIAGNOSIS — J4540 Moderate persistent asthma, uncomplicated: Secondary | ICD-10-CM

## 2019-09-26 DIAGNOSIS — J3089 Other allergic rhinitis: Secondary | ICD-10-CM | POA: Diagnosis not present

## 2019-09-26 MED ORDER — BREZTRI AEROSPHERE 160-9-4.8 MCG/ACT IN AERO: INHALATION_SPRAY | RESPIRATORY_TRACT | 5 refills | Status: DC

## 2019-09-26 MED ORDER — ALBUTEROL SULFATE HFA 108 (90 BASE) MCG/ACT IN AERS: 2.0000 | INHALATION_SPRAY | Freq: Four times a day (QID) | RESPIRATORY_TRACT | 1 refills | Status: DC | PRN

## 2019-09-26 MED ORDER — FAMOTIDINE 40 MG PO TABS: 40.0000 mg | ORAL_TABLET | Freq: Every evening | ORAL | 1 refills | Status: DC

## 2019-09-26 MED ORDER — MONTELUKAST SODIUM 10 MG PO TABS: 10.0000 mg | ORAL_TABLET | Freq: Every day | ORAL | 1 refills | Status: DC

## 2019-09-26 MED ORDER — QNASL 80 MCG/ACT NA AERS: 1.0000 | INHALATION_SPRAY | Freq: Every day | NASAL | 5 refills | Status: DC | PRN

## 2019-09-26 MED ORDER — DEXILANT 60 MG PO CPDR: 60.0000 mg | DELAYED_RELEASE_CAPSULE | Freq: Every day | ORAL | 1 refills | Status: DC

## 2019-09-26 NOTE — Patient Instructions (Addendum)
  1. Continue dupilumab every 2 weeks  2. Continue montelukast 10mg  daily  3. Continue Qnasl 80 - 1-2 puffs one time per day    4. START Breztri - 2 inhalations 2 times per day [facility] replacement)  5. Continue dexilant 60mg  in AM plus START Famotidine 40 in PM   6. Continue cetirizine and albuterol MDI if needed.  7. Return in 4 weeks or earlier if problem

## 2019-09-26 NOTE — Progress Notes (Signed)
[place] [place]   Follow-up Note  Referring Provider: Nena Polio, NP Primary Provider: Nena Polio, NP Date of Office Visit: 09/26/2019  Subjective:   [name] (DOB: 05-12-1951) is a 69 y.o. female who returns to the [facility] on 09/26/2019 in re-evaluation of the following:  HPI: Burkley returns to this clinic in evaluation of asthma and allergic rhinitis and chronic sinusitis and LPR.  Her last visit to this clinic with me was 28 Sep 2017.  She did have a E-med visit with Dr. Maudie Mercury on 21 August 2019 for acute vertigo which apparently is a recurrent event for which she took some meclizine and a very short course of prednisone.  Fortunately, that issue has resolved.  Since using dupilumab she has had excellent control of her upper and lower airway issue and she was able to taper down her Dulera to just 1 time per day and rarely used a short acting bronchodilator.  Since the spring has arrived she has had a little bit more activity of her asthma and she has had some wheezing and coughing and had to use her bronchodilator intermittently still averaging out to less than twice a week.  She has not required a systemic steroid to treat an exacerbation.  Her nose has really been doing quite well and she has not required a antibiotic to treat an episode of sinusitis in a prolonged period in time.  She continues on Qnasl.  Since the FDA removed ranitidine from the market she has had very bad reflux while using her Dexilant in the morning.  She has been having burning regurgitation.  She tried some over-the-counter Prilosec for use at nighttime which appeared to help this issue.  She has obtain 2 Covid vaccinations.  Allergies as of 09/26/2019      Reactions   Sulfa Antibiotics Itching   Amoxicillin-pot Clavulanate Nausea And Vomiting   Doxycycline Nausea And Vomiting   Nitrofurantoin Macrocrystal Other (See Comments)    Edema Edema   Pregabalin Other (See Comments)   Blurry vision Blurry vision      Medication List      acetaminophen 500 MG tablet Commonly known as: TYLENOL Take 1,000 mg by mouth every 6 (six) hours as needed for pain. Reported on 05/15/2015   albuterol 108 (90 Base) MCG/ACT inhaler Commonly known as: VENTOLIN HFA Inhale 2 puffs into the lungs every 6 (six) hours as needed for wheezing or shortness of breath.   ALPRAZolam 0.5 MG tablet Commonly known as: XANAX Take 0.5 mg by mouth at bedtime as needed for sleep.   aspirin 81 MG EC tablet Take 1 tablet (81 mg total) by mouth daily.   atorvastatin 20 MG tablet Commonly known as: LIPITOR Take 1 tablet (20 mg total) by mouth daily at 6 PM.   Beclomethasone Dipropionate 80 MCG/ACT Aers Commonly known as: Qnasl Place 1-2 sprays into the nose daily as needed (allergies). Reported on 05/15/2015   brimonidine 0.2 % ophthalmic solution Commonly known as: ALPHAGAN Place 1 drop into both eyes every morning.   Dexilant 60 MG capsule Generic drug: dexlansoprazole Take 1 capsule (60 mg total) by mouth daily.   Dulera 200-5 MCG/ACT Aero Generic drug: mometasone-formoterol Inhale 2 puffs into the lungs 2 (two) times daily.   dupilumab 300 MG/2ML prefilled syringe Commonly known as: Dupixent Inject 300 mg into the skin every 14 (fourteen) days.   EPINEPHrine 0.3 mg/0.3 mL Soaj injection Commonly known  as: EpiPen 2-Pak Use for life threatening allergic reaction   escitalopram 20 MG tablet Commonly known as: LEXAPRO Take 20 mg by mouth daily.   latanoprost 0.005 % ophthalmic solution Commonly known as: XALATAN Place 1 drop into both eyes every morning.   levothyroxine 25 MCG tablet Commonly known as: SYNTHROID Take by mouth.   montelukast 10 MG tablet Commonly known as: SINGULAIR Take 1 tablet (10 mg total) by mouth at bedtime.       Past Medical History:  Diagnosis Date  . Allergy history unknown   . Asthma    . Breast cancer ([place]) 03/24/2011   left  . Bronchitis   . Dizziness   . GERD (gastroesophageal reflux disease)   . Glaucoma   . Headache(784.0)   . Personal history of radiation therapy 2011  . Pneumonia    hx of  . PONV (postoperative nausea and vomiting)   . Shortness of breath    asthma  . Sinusitis     Past Surgical History:  Procedure Laterality Date  . BREAST LUMPECTOMY Left 08-2009   left   . EYE SURGERY Bilateral    laser surgery - glaucoma  . eyelid surgery Bilateral    for glaucoma  . NOSE SURGERY  12-2006  . OOPHORECTOMY  2001  . SINUS ENDO W/FUSION Bilateral 02/16/2013   Procedure: ENDOSCOPIC SINUS SURGERY WITH FUSION NAVIGATION;  Surgeon: Jerrell Belfast, MD;  Location: [facility] OR;  Service: ENT;  Laterality: Bilateral;    Review of systems negative except as noted in HPI / PMHx or noted below:  Review of Systems  Constitutional: Negative.   HENT: Negative.   Eyes: Negative.   Respiratory: Negative.   Cardiovascular: Negative.   Gastrointestinal: Negative.   Genitourinary: Negative.   Musculoskeletal: Negative.   Skin: Negative.   Neurological: Negative.   Endo/Heme/Allergies: Negative.   Psychiatric/Behavioral: Negative.      Objective:   Vitals:   09/26/19 1551  BP: 130/80  Pulse: 75  Resp: 16  Temp: 98.1 F (36.7 C)  SpO2: 95%   Height: 5' 1.5" (156.2 cm)  Weight: 176 lb 12.8 oz (80.2 kg)   Physical Exam Constitutional:      Appearance: She is not diaphoretic.  HENT:     Head: Normocephalic.     Right Ear: Tympanic membrane, ear canal and external ear normal.     Left Ear: Tympanic membrane, ear canal and external ear normal.     Nose: Nose normal. No mucosal edema or rhinorrhea.     Mouth/Throat:     Pharynx: Uvula midline. No oropharyngeal exudate.  Eyes:     Conjunctiva/sclera: Conjunctivae normal.  Neck:     Thyroid: No thyromegaly.     Trachea: Trachea normal. No tracheal tenderness or tracheal deviation.  Cardiovascular:      Rate and Rhythm: Normal rate and regular rhythm.     Heart sounds: Normal heart sounds, S1 normal and S2 normal. No murmur.  Pulmonary:     Effort: No respiratory distress.     Breath sounds: No stridor. Wheezing (Expiratory wheezes posterior lung fields) present. No rales.  Lymphadenopathy:     Head:     Right side of head: No tonsillar adenopathy.     Left side of head: No tonsillar adenopathy.     Cervical: No cervical adenopathy.  Skin:    Findings: No erythema or rash.     Nails: There is no clubbing.  Neurological:     Mental Status: She is  alert.     Diagnostics:    Spirometry was performed and demonstrated an FEV1 of 1.74 at 84 % of predicted.  Assessment and Plan:   1. Asthma, moderate persistent, well-controlled   2. Other allergic rhinitis   3. LPRD (laryngopharyngeal reflux disease)     1. Continue dupilumab every 2 weeks  2. Continue montelukast 10mg  daily  3. Continue Qnasl 80 - 1-2 puffs one time per day    4. START Breztri - 2 inhalations 2 times per day [facility] replacement)  5. Continue dexilant 60mg  in AM plus START Famotidine 40 in PM   6. Continue cetirizine and albuterol MDI if needed.  7. Return in 4 weeks or earlier if problem  Although Norabelle has really done quite well with her multiorgan atopic eosinophilic driven airway disease while using dupilumab and various other anti-inflammatory agents this spring she is having more activity of her lower airway inflammatory state and we will treat her with a triple inhaler added into her dupilumab.  As well, she has had active reflux while using just Dexilant and we will start her on famotidine in evening.  I will see her back in his clinic in 4 weeks to assess her response to medical manipulations.  Further evaluation treatment will based on her response.  Allena Katz, MD Allergy / Immunology [place]

## 2019-09-27 ENCOUNTER — Encounter: Payer: Self-pay | Admitting: Allergy and Immunology

## 2019-10-02 ENCOUNTER — Telehealth: Payer: Self-pay | Admitting: Allergy and Immunology

## 2019-10-02 MED ORDER — PREDNISONE 10 MG PO TABS: ORAL_TABLET | ORAL | 0 refills | Status: DC

## 2019-10-02 NOTE — Telephone Encounter (Signed)
Patient informed and ERX sent.  

## 2019-10-02 NOTE — Telephone Encounter (Signed)
Please have patient start prednisone 10 mg - 2 tablets today and then 2 tablets 1 time per day for 5 days and keep [place] informed about her response.

## 2019-10-02 NOTE — Telephone Encounter (Signed)
Patient had dry cough since Friday and is taking cough medicine benzoates pearls 1 tablet bid but instructions states she can have it TID. She is using Breztri 2 puff bid. Albuterol has not been used and was instructed to do 4 puff every 4-6 hours. Patient has tried Singulair, Zyrtec and sudafed to assist with her cough (not for cough). Patient is having wheezing and stated she is not using her rescue inhaler and was advise to start using it. She is not using Mucinex and states that prednisone is her next course of therapy. Your sincere thought is greatly appreciated.

## 2019-10-02 NOTE — Telephone Encounter (Signed)
Patient called and said that she started coughing on Friday and can not get rid of it she said she needs some predisone and that would help. [facility] [address] [place]. [phone].

## 2019-10-31 ENCOUNTER — Ambulatory Visit: Payer: Medicare Other | Admitting: Allergy and Immunology

## 2019-11-03 ENCOUNTER — Telehealth: Payer: Self-pay | Admitting: Allergy and Immunology

## 2019-11-03 NOTE — Telephone Encounter (Signed)
Called and spoke with patient and informed her to continue with the antibiotic and the nasal spray. Patient stated that she is out of prednisone and she stated that usually Dr. Neldon Mc is pretty aggressive with his treatment when she experiences these symptoms and is wondering what he recommends. I informed patient that it may be Monday before we hear anything.Patient verbalized understanding.

## 2019-11-03 NOTE — Telephone Encounter (Signed)
Patient called and said that she is not better and that she started the antibiotic yesterday and she needs more predisone. She still has eye pressure and tender around the eye area, and headache, sinus infection.[facility] [place]. [phone].Marland Kitchen

## 2019-11-06 NOTE — Telephone Encounter (Signed)
Please find out which antibiotic Tashima is using and how much longer does she have on this antibiotic?  And how she doing today compared to last week?

## 2019-11-06 NOTE — Telephone Encounter (Signed)
Unable to reach patient. Left voicemail for patient to return call

## 2019-11-06 NOTE — Telephone Encounter (Signed)
Patient returned my call and states she is only taking Azithromycin 250 mg and she only have 2 days left. She states she is not doing any better from last week. She still has the sinus pressure and the headache.

## 2019-11-06 NOTE — Telephone Encounter (Signed)
Please have her come in tomorrow

## 2019-11-06 NOTE — Telephone Encounter (Signed)
Unable to reach patient. Left voicemail message for patient to return call

## 2019-11-06 NOTE — Telephone Encounter (Signed)
Patient has been scheduled as a work in.

## 2019-11-07 ENCOUNTER — Encounter: Payer: Self-pay | Admitting: Allergy and Immunology

## 2019-11-07 ENCOUNTER — Other Ambulatory Visit: Payer: Self-pay

## 2019-11-07 ENCOUNTER — Ambulatory Visit (INDEPENDENT_AMBULATORY_CARE_PROVIDER_SITE_OTHER): Payer: Medicare Other | Admitting: Allergy and Immunology

## 2019-11-07 VITALS — BP 142/68 | HR 74 | Temp 98.3°F | Resp 16 | Ht 63.0 in | Wt 174.4 lb

## 2019-11-07 DIAGNOSIS — K219 Gastro-esophageal reflux disease without esophagitis: Secondary | ICD-10-CM | POA: Diagnosis not present

## 2019-11-07 DIAGNOSIS — J4540 Moderate persistent asthma, uncomplicated: Secondary | ICD-10-CM

## 2019-11-07 DIAGNOSIS — J324 Chronic pansinusitis: Secondary | ICD-10-CM | POA: Diagnosis not present

## 2019-11-07 DIAGNOSIS — J3089 Other allergic rhinitis: Secondary | ICD-10-CM | POA: Diagnosis not present

## 2019-11-07 MED ORDER — ALBUTEROL SULFATE HFA 108 (90 BASE) MCG/ACT IN AERS: 2.0000 | INHALATION_SPRAY | Freq: Four times a day (QID) | RESPIRATORY_TRACT | 1 refills | Status: DC | PRN

## 2019-11-07 MED ORDER — MOXIFLOXACIN HCL 400 MG PO TABS: ORAL_TABLET | ORAL | 0 refills | Status: DC

## 2019-11-07 MED ORDER — QNASL 80 MCG/ACT NA AERS: 1.0000 | INHALATION_SPRAY | Freq: Every day | NASAL | 1 refills | Status: DC | PRN

## 2019-11-07 MED ORDER — BREZTRI AEROSPHERE 160-9-4.8 MCG/ACT IN AERO: INHALATION_SPRAY | RESPIRATORY_TRACT | 1 refills | Status: DC

## 2019-11-07 MED ORDER — MONTELUKAST SODIUM 10 MG PO TABS: 10.0000 mg | ORAL_TABLET | Freq: Every day | ORAL | 1 refills | Status: DC

## 2019-11-07 MED ORDER — METHYLPREDNISOLONE ACETATE 80 MG/ML IJ SUSP
80.0000 mg | Freq: Once | INTRAMUSCULAR | Status: AC
  Administered 2019-11-07: 80 mg via INTRAMUSCULAR

## 2019-11-07 MED ORDER — FAMOTIDINE 40 MG PO TABS: 40.0000 mg | ORAL_TABLET | Freq: Every evening | ORAL | 1 refills | Status: DC

## 2019-11-07 NOTE — Progress Notes (Signed)
[facility]   Follow-up Note  Referring Provider: Nena Polio, NP Primary Provider: Nena Polio, NP Date of Office Visit: 11/07/2019  Subjective:   [name] (DOB: [birth year]) is a 69 y.o. female who returns to the [facility] on 11/07/2019 in re-evaluation of the following:  HPI: [name] returns to this clinic in reevaluation of asthma and allergic rhinitis and a history of chronic sinusitis and LPR.  Her last visit to this clinic was 26 Sep 2019.  Although she was doing wonderful while using dupilumab regarding control of her asthma and upper airway issue about 3 weeks ago she developed head pressure and nasal congestion and her teeth are hurting and she cannot seem to clear this issue.  She was treated with a low-dose of prednisone and given azithromycin.  She is not really that much better at this point.  She does not have any anosmia.  She does have some yellow nasal discharge.  She has some slight cough.  Her reflux appears to be under pretty good control at this point in time.  Allergies as of 11/07/2019      Reactions   Sulfa Antibiotics Itching   Amoxicillin-pot Clavulanate Nausea And Vomiting   Doxycycline Nausea And Vomiting   Nitrofurantoin Macrocrystal Other (See Comments)   Edema Edema   Pregabalin Other (See Comments)   Blurry vision Blurry vision      Medication List      acetaminophen 500 MG tablet Commonly known as: TYLENOL Take 1,000 mg by mouth every 6 (six) hours as needed for pain. Reported on 05/15/2015   albuterol 108 (90 Base) MCG/ACT inhaler Commonly known as: VENTOLIN HFA Inhale 2 puffs into the lungs every 6 (six) hours as needed for wheezing or shortness of breath.   ALPRAZolam 0.5 MG tablet Commonly known as: XANAX Take 0.5 mg by mouth at bedtime as needed for sleep.   aspirin 81 MG EC tablet Take 1 tablet (81 mg total) by mouth daily.   atorvastatin 20 MG  tablet Commonly known as: LIPITOR Take 1 tablet (20 mg total) by mouth daily at 6 PM.   Breztri Aerosphere 160-9-4.8 MCG/ACT Aero Generic drug: Budeson-Glycopyrrol-Formoterol 2 inhalations 2 times per day   brimonidine 0.2 % ophthalmic solution Commonly known as: ALPHAGAN Place 1 drop into both eyes every morning.   Dexilant 60 MG capsule Generic drug: dexlansoprazole Take 1 capsule (60 mg total) by mouth daily.   Dulera 200-5 MCG/ACT Aero Generic drug: mometasone-formoterol Inhale 2 puffs into the lungs 2 (two) times daily.   dupilumab 300 MG/2ML prefilled syringe Commonly known as: Dupixent Inject 300 mg into the skin every 14 (fourteen) days.   EPINEPHrine 0.3 mg/0.3 mL Soaj injection Commonly known as: EpiPen 2-Pak Use for life threatening allergic reaction   escitalopram 20 MG tablet Commonly known as: LEXAPRO Take 20 mg by mouth daily.   famotidine 40 MG tablet Commonly known as: PEPCID Take 1 tablet (40 mg total) by mouth every evening.   latanoprost 0.005 % ophthalmic solution Commonly known as: XALATAN Place 1 drop into both eyes every morning.   levothyroxine 25 MCG tablet Commonly known as: SYNTHROID Take by mouth.   montelukast 10 MG tablet Commonly known as: SINGULAIR Take 1 tablet (10 mg total) by mouth at bedtime.   moxifloxacin 400 MG tablet Commonly known as: Avelox 1 tablet daily for 10 days Started by: Catherine Butch Kevan Rosebush, MD   predniSONE 10 MG  tablet Commonly known as: DELTASONE Take two tablets once daily for six days as directed.   Qnasl 80 MCG/ACT Aers Generic drug: Beclomethasone Dipropionate Place 1-2 sprays into the nose daily as needed (allergies).        Past Medical History:  Diagnosis Date  . Allergy history unknown   . Asthma   . Breast cancer ([place]) 03/24/2011   left  . Bronchitis   . Dizziness   . GERD (gastroesophageal reflux disease)   . Glaucoma   . Headache(784.0)   . Personal history of radiation therapy 2011  .  Pneumonia    hx of  . PONV (postoperative nausea and vomiting)   . Shortness of breath    asthma  . Sinusitis     Past Surgical History:  Procedure Laterality Date  . BREAST LUMPECTOMY Left 08-2009   left   . EYE SURGERY Bilateral    laser surgery - glaucoma  . eyelid surgery Bilateral    for glaucoma  . NOSE SURGERY  12-2006  . OOPHORECTOMY  2001  . SINUS ENDO W/FUSION Bilateral 02/16/2013   Procedure: ENDOSCOPIC SINUS SURGERY WITH FUSION NAVIGATION;  Surgeon: Jerrell Belfast, MD;  Location: [facility] OR;  Service: ENT;  Laterality: Bilateral;    Review of systems negative except as noted in HPI / PMHx or noted below:  Review of Systems  Constitutional: Negative.   HENT: Negative.   Eyes: Negative.   Respiratory: Negative.   Cardiovascular: Negative.   Gastrointestinal: Negative.   Genitourinary: Negative.   Musculoskeletal: Negative.   Skin: Negative.   Neurological: Negative.   Endo/Heme/Allergies: Negative.   Psychiatric/Behavioral: Negative.      Objective:   Vitals:   11/07/19 1048  BP: (!) 142/68  Pulse: 74  Resp: 16  Temp: 98.3 F (36.8 C)  SpO2: 98%   Height: 5\' 3"  (160 cm)  Weight: 174 lb 6.4 oz (79.1 kg)   Physical Exam Constitutional:      Appearance: She is not diaphoretic.  HENT:     Head: Normocephalic.     Right Ear: Tympanic membrane, ear canal and external ear normal.     Left Ear: Tympanic membrane, ear canal and external ear normal.     Nose: Nose normal. No mucosal edema or rhinorrhea.     Mouth/Throat:     Pharynx: Uvula midline. No oropharyngeal exudate.  Eyes:     Conjunctiva/sclera: Conjunctivae normal.  Neck:     Thyroid: No thyromegaly.     Trachea: Trachea normal. No tracheal tenderness or tracheal deviation.  Cardiovascular:     Rate and Rhythm: Normal rate and regular rhythm.     Heart sounds: Normal heart sounds, S1 normal and S2 normal. No murmur heard.   Pulmonary:     Effort: No respiratory distress.     Breath sounds:  Normal breath sounds. No stridor. No wheezing or rales.  Lymphadenopathy:     Head:     Right side of head: No tonsillar adenopathy.     Left side of head: No tonsillar adenopathy.     Cervical: No cervical adenopathy.  Skin:    Findings: No erythema or rash.     Nails: There is no clubbing.  Neurological:     Mental Status: She is alert.     Diagnostics:    Spirometry was performed and demonstrated an FEV1 of 1.90 at 84 % of predicted.  Assessment and Plan:   1. Asthma, moderate persistent, well-controlled   2. Other allergic rhinitis  3. LPRD (laryngopharyngeal reflux disease)   4. Chronic pansinusitis     1. Continue dupilumab every 2 weeks  2. Continue montelukast 10mg  daily  3. Continue Qnasl 80 - 1-2 puffs one time per day    4. Continue Breztri - 2 inhalations 2 times per day  5. Continue dexilant 60mg  in AM plus Famotidine 40 in PM   6. Continue cetirizine and albuterol MDI if needed.  7. For this recent episode:   A. Depomedrol 80 IM delivered in clinic today  B. Avelox 400 mg - 1 tablet daily for 10 days  8. Return in 6 months or earlier if problem  I will treat Dhanya with a very broad-spectrum antibiotic as well as a systemic steroid injection to clear out what appears to be recrudescence of a persistent sinus infection.  She will continue to utilize anti-inflammatory agents for her airway including use of dupilumab as noted above and continue on therapy directed against reflux.  I will see her back in this clinic in 6 months or earlier if there is a problem.  Allena Katz, MD Allergy / Immunology [place]

## 2019-11-07 NOTE — Patient Instructions (Addendum)
°  1. Continue dupilumab every 2 weeks  2. Continue montelukast 10mg  daily  3. Continue Qnasl 80 - 1-2 puffs one time per day    4. Continue Breztri - 2 inhalations 2 times per day  5. Continue dexilant 60mg  in AM plus Famotidine 40 in PM   6. Continue cetirizine and albuterol MDI if needed.  7. For this recent episode:   A. Depomedrol 80 IM delivered in clinic today  B. Avelox 400 mg - 1 tablet daily for 10 days  8. Return in 6 months or earlier if problem

## 2019-11-08 ENCOUNTER — Encounter: Payer: Self-pay | Admitting: Allergy and Immunology

## 2020-02-07 ENCOUNTER — Other Ambulatory Visit: Payer: Self-pay | Admitting: *Deleted

## 2020-02-07 MED ORDER — DUPIXENT 300 MG/2ML ~~LOC~~ SOSY: 300.0000 mg | PREFILLED_SYRINGE | SUBCUTANEOUS | 3 refills | Status: DC

## 2020-02-07 NOTE — Telephone Encounter (Signed)
Patient called and requested refill of Dupixent to [facility]. Erx sent

## 2020-04-04 ENCOUNTER — Telehealth: Payer: Self-pay | Admitting: Allergy and Immunology

## 2020-04-04 NOTE — Telephone Encounter (Signed)
Patient states she had to use her inhaler, she did not have her spacer, but now she has thrush. Patient would like Diflucan called into [facility] in [place] on [address]. Patient would like 3 or 4 called in at once because she will only have to pay one co pay and she gets thrush often after using her inhaler so it would be good to have on hand.  Please advise.

## 2020-04-04 NOTE — Telephone Encounter (Signed)
Dr. Neldon Mc please advise in regards to medication for thrust. Thank You.

## 2020-04-08 ENCOUNTER — Other Ambulatory Visit: Payer: Self-pay

## 2020-04-08 MED ORDER — FLUCONAZOLE 150 MG PO TABS
150.0000 mg | ORAL_TABLET | ORAL | 0 refills | Status: AC
Start: 2020-04-08 — End: 2020-04-18

## 2020-04-08 NOTE — Telephone Encounter (Signed)
Please inform [name] that she can use Diflucan 150 mg tablet 1 tablet now and can repeat weekly.  Give her 10 tablets.  This message is 43 days old.  Maybe it would have been better to have a different physician address this issue on the day that [facility] contacted our clinic.

## 2020-04-08 NOTE — Telephone Encounter (Signed)
I called patient to inform her that the medication has been sent in. Patient verbalized understanding.

## 2020-04-09 NOTE — Telephone Encounter (Signed)
My apologies. This phone message was sent to you Thursday at 4:22pm. Sorry. I got my days mixed up. The call was documented by Mel Almond at 10:33am on the 18th and Ashleigh sent it to you at 4:22pm.  Arletta Bale

## 2020-04-09 NOTE — Telephone Encounter (Signed)
Dr. Neldon Mc, The call was initiated on Friday at 10:28am. I will remind the staff not to send you messages on Friday and to send them to a provider that is working. Johnette

## 2020-04-14 ENCOUNTER — Encounter: Payer: Self-pay | Admitting: Emergency Medicine

## 2020-04-14 ENCOUNTER — Emergency Department (INDEPENDENT_AMBULATORY_CARE_PROVIDER_SITE_OTHER)
Admission: EM | Admit: 2020-04-14 | Discharge: 2020-04-14 | Disposition: A | Discharge status: Home / Self Care | Payer: Medicare Other | Source: Home / Self Care

## 2020-04-14 DIAGNOSIS — J029 Acute pharyngitis, unspecified: Secondary | ICD-10-CM | POA: Diagnosis not present

## 2020-04-14 DIAGNOSIS — R519 Headache, unspecified: Secondary | ICD-10-CM | POA: Diagnosis not present

## 2020-04-14 LAB — POCT RAPID STREP A (OFFICE): Rapid Strep A Screen: NEGATIVE

## 2020-04-14 NOTE — ED Provider Notes (Signed)
[facility]    CSN: [id number] Arrival date & time: 04/14/20  1246      History   Chief Complaint Chief Complaint  Patient presents with  . Sore Throat    HPI [name] is a 69 y.o. female.   HPI [name] is a 69 y.o. female presenting to UC with c/o sore throat that started this morning when she woke up.  Pain is aching and sore, 7/10, worse with swallowing.  She also reports 2-3 days of headache, she has been taking Tylenol for. She attributes the HA to change in season as well as increased stress due to her brother in the hospital with complications from diabetes.  Pt denies fever, chills, n/v/d/ pt received both doses of COVID vaccine in March 2021 and has received her flu vaccine.    Past Medical History:  Diagnosis Date  . Allergy history unknown   . Asthma   . Breast cancer ([place]) 03/24/2011   left  . Bronchitis   . Dizziness   . GERD (gastroesophageal reflux disease)   . Glaucoma   . Headache(784.0)   . Personal history of radiation therapy 2011  . Pneumonia    hx of  . PONV (postoperative nausea and vomiting)   . Shortness of breath    asthma  . Sinusitis     Patient Active Problem List   Diagnosis Date Noted  . Dizziness 08/21/2019  . LPRD (laryngopharyngeal reflux disease) 04/21/2019  . TIA (transient ischemic attack) 09/16/2016  . Acute maxillary sinusitis 05/15/2015  . Asthma with acute exacerbation 05/15/2015  . Vaginal candidiasis 05/15/2015  . History of breast cancer 10/16/2013  . Sinusitis, chronic 02/16/2013    Class: Chronic  . Asthma, well controlled 12/08/2012  . Chronic maxillary sinusitis 06/18/2012  . Other allergic rhinitis 05/02/2011  . Breast cancer ([place]) 03/24/2011  . Cough 03/24/2011  . Shortness of breath 03/24/2011    Past Surgical History:  Procedure Laterality Date  . BREAST LUMPECTOMY Left 08-2009   left   . EYE SURGERY Bilateral    laser surgery - glaucoma  . eyelid surgery Bilateral    for  glaucoma  . NOSE SURGERY  12-2006  . OOPHORECTOMY  2001  . SINUS ENDO W/FUSION Bilateral 02/16/2013   Procedure: ENDOSCOPIC SINUS SURGERY WITH FUSION NAVIGATION;  Surgeon: Jerrell Belfast, MD;  Location: [facility];  Service: ENT;  Laterality: Bilateral;    OB History   No obstetric history on file.      Home Medications    Prior to Admission medications   Medication Sig Start Date End Date Taking? Authorizing Provider  acetaminophen (TYLENOL) 500 MG tablet Take 1,000 mg by mouth every 6 (six) hours as needed for pain. Reported on 05/15/2015   Yes [provider]  albuterol (VENTOLIN HFA) 108 (90 Base) MCG/ACT inhaler Inhale 2 puffs into the lungs every 6 (six) hours as needed for wheezing or shortness of breath. 11/07/19  Yes Kozlow, Donnamarie Poag, MD  ALPRAZolam Duanne Moron) 0.5 MG tablet Take 0.5 mg by mouth at bedtime as needed for sleep.  09/20/15  Yes [provider]  aspirin EC 81 MG EC tablet Take 1 tablet (81 mg total) by mouth daily. 09/17/16  Yes Eulogio Bear U, DO  atorvastatin (LIPITOR) 20 MG tablet Take 1 tablet (20 mg total) by mouth daily at 6 PM. 09/16/16  Yes Vann, Jessica U, DO  Beclomethasone Dipropionate (QNASL) 80 MCG/ACT AERS Place 1-2 sprays into the nose daily  as needed (allergies). 11/07/19  Yes Kozlow, Donnamarie Poag, MD  brimonidine (ALPHAGAN) 0.2 % ophthalmic solution Place 1 drop into both eyes every morning.   Yes [provider]  Budeson-Glycopyrrol-Formoterol (BREZTRI AEROSPHERE) 160-9-4.8 MCG/ACT AERO 2 inhalations 2 times per day 11/07/19  Yes Kozlow, Donnamarie Poag, MD  dexlansoprazole (DEXILANT) 60 MG capsule Take 1 capsule (60 mg total) by mouth daily. 09/26/19  Yes Kozlow, Donnamarie Poag, MD  dupilumab (DUPIXENT) 300 MG/2ML prefilled syringe Inject 300 mg into the skin every 14 (fourteen) days. 02/07/20  Yes Kozlow, Donnamarie Poag, MD  EPINEPHrine (EPIPEN 2-PAK) 0.3 mg/0.3 mL IJ SOAJ injection Use for life threatening allergic reaction 04/21/19  Yes Ambs, Kathrine Cords, FNP  escitalopram  (LEXAPRO) 20 MG tablet Take 20 mg by mouth daily.   Yes [provider]  famotidine (PEPCID) 40 MG tablet Take 1 tablet (40 mg total) by mouth every evening. 11/07/19  Yes Kozlow, Donnamarie Poag, MD  fluconazole (DIFLUCAN) 150 MG tablet Take 1 tablet (150 mg total) by mouth once a week for 10 days. 04/08/20 04/18/20 Yes Kozlow, Donnamarie Poag, MD  latanoprost (XALATAN) 0.005 % ophthalmic solution Place 1 drop into both eyes every morning.    Yes [provider]  levothyroxine (SYNTHROID) 25 MCG tablet Take by mouth. 11/08/18  Yes [provider]  mometasone-formoterol (DULERA) 200-5 MCG/ACT AERO Inhale 2 puffs into the lungs 2 (two) times daily. 05/31/19  Yes Kozlow, Donnamarie Poag, MD  montelukast (SINGULAIR) 10 MG tablet Take 1 tablet (10 mg total) by mouth at bedtime. 11/07/19  Yes Kozlow, Donnamarie Poag, MD  moxifloxacin (AVELOX) 400 MG tablet 1 tablet daily for 10 days 11/07/19   Kozlow, Donnamarie Poag, MD  predniSONE (DELTASONE) 10 MG tablet Take two tablets once daily for six days as directed. 10/02/19   Kozlow, Donnamarie Poag, MD    Family History Family History  Problem Relation Age of Onset  . Breast cancer Mother   . Emphysema Father   . Heart disease Father   . Asthma Brother     Social History Social History   Tobacco Use  . Smoking status: Never Smoker  . Smokeless tobacco: Never Used  Vaping Use  . Vaping Use: Never used  Substance Use Topics  . Alcohol use: Yes    Comment: occ  . Drug use: No     Allergies   Sulfa antibiotics, Amoxicillin-pot clavulanate, Doxycycline, Nitrofurantoin macrocrystal, and Pregabalin   Review of Systems Review of Systems  Constitutional: Negative for chills and fever.  HENT: Positive for congestion (minmal) and sore throat. Negative for ear pain, trouble swallowing and voice change.   Respiratory: Negative for cough and shortness of breath.   Cardiovascular: Negative for chest pain and palpitations.  Gastrointestinal: Negative for abdominal pain, diarrhea,  nausea and vomiting.  Musculoskeletal: Negative for arthralgias, back pain and myalgias.  Skin: Negative for rash.  Neurological: Positive for headaches. Negative for dizziness and light-headedness.  All other systems reviewed and are negative.    Physical Exam Triage Vital Signs ED Triage Vitals  Enc Vitals Group     BP 04/14/20 1308 (!) 150/78     Pulse Rate 04/14/20 1308 86     Resp --      Temp 04/14/20 1308 98.9 F (37.2 C)     Temp Source 04/14/20 1308 Oral     SpO2 04/14/20 1308 97 %     Weight --      Height --      Head Circumference --  Peak Flow --      Pain Score 04/14/20 1309 7     Pain Loc --      Pain Edu? --      Excl. in [place]? --    No data found.  Updated Vital Signs BP (!) 150/78 (BP Location: Right Arm)   Pulse 86   Temp 98.9 F (37.2 C) (Oral)   SpO2 97%   Visual Acuity Right Eye Distance:   Left Eye Distance:   Bilateral Distance:    Right Eye Near:   Left Eye Near:    Bilateral Near:     Physical Exam Vitals and nursing note reviewed.  Constitutional:      General: She is not in acute distress.    Appearance: She is well-developed. She is not ill-appearing, toxic-appearing or diaphoretic.  HENT:     Head: Normocephalic and atraumatic.     Right Ear: Tympanic membrane and ear canal normal.     Left Ear: Tympanic membrane and ear canal normal.     Nose: Nose normal.     Right Sinus: No maxillary sinus tenderness or frontal sinus tenderness.     Left Sinus: No maxillary sinus tenderness or frontal sinus tenderness.     Mouth/Throat:     Lips: Pink.     Mouth: Mucous membranes are moist.     Pharynx: Oropharynx is clear. Uvula midline. Posterior oropharyngeal erythema and uvula swelling present. No pharyngeal swelling or oropharyngeal exudate.     Tonsils: No tonsillar exudate or tonsillar abscesses.  Cardiovascular:     Rate and Rhythm: Normal rate and regular rhythm.  Pulmonary:     Effort: Pulmonary effort is normal. No  respiratory distress.     Breath sounds: Normal breath sounds. No stridor. No wheezing, rhonchi or rales.  Musculoskeletal:        General: Normal range of motion.     Cervical back: Normal range of motion and neck supple.  Lymphadenopathy:     Cervical: No cervical adenopathy.  Skin:    General: Skin is warm and dry.  Neurological:     Mental Status: She is alert and oriented to person, place, and time.  Psychiatric:        Behavior: Behavior normal.      UC Treatments / Results  Labs (all labs ordered are listed, but only abnormal results are displayed) Labs Reviewed  COVID-19, FLU A+B AND RSV  STREP A DNA PROBE  POCT RAPID STREP A (OFFICE)    EKG   Radiology No results found.  Procedures Procedures (including critical care time)  Medications Ordered in UC Medications - No data to display  Initial Impression / Assessment and Plan / UC Course  I have reviewed the triage vital signs and the nursing notes.  Pertinent labs & imaging results that were available during my care of the patient were reviewed by me and considered in my medical decision making (see chart for details).    Rapid strep: NEGATIVE Culture sent COVID/RSV/Flu pending F/u with PCP later this week if needed.  Final Clinical Impressions(s) / UC Diagnoses   Final diagnoses:  Acute pharyngitis, unspecified etiology  Generalized headache     Discharge Instructions      You may take 500mg  acetaminophen every 4-6 hours or in combination with ibuprofen 400-600mg  every 6-8 hours as needed for pain, inflammation, and fever.  Be sure to well hydrated with clear liquids and get at least 8 hours of sleep at night, preferably  more while sick.   Please follow up with family medicine in 1 week if needed.     ED Prescriptions    None     PDMP not reviewed this encounter.   Noe Gens, [place] 04/14/20 1402

## 2020-04-14 NOTE — ED Triage Notes (Signed)
Patient states that she awoke this morning with a sore throat, afebrile, headache.  Patient has been vaccinated.

## 2020-04-14 NOTE — Discharge Instructions (Signed)
  You may take 500mg acetaminophen every 4-6 hours or in combination with ibuprofen 400-600mg every 6-8 hours as needed for pain, inflammation, and fever.  Be sure to well hydrated with clear liquids and get at least 8 hours of sleep at night, preferably more while sick.   Please follow up with family medicine in 1 week if needed.   

## 2020-04-15 LAB — STREP A DNA PROBE: Group A Strep Probe: NOT DETECTED

## 2020-04-16 ENCOUNTER — Telehealth: Payer: Self-pay | Admitting: Emergency Medicine

## 2020-04-16 LAB — COVID-19, FLU A+B AND RSV
Influenza A, NAA: NOT DETECTED
Influenza B, NAA: NOT DETECTED
RSV, NAA: NOT DETECTED
SARS-CoV-2, NAA: NOT DETECTED

## 2020-04-16 NOTE — Telephone Encounter (Signed)
Return call to [place] busy - COVID test results pending, strep culture negative

## 2020-12-03 ENCOUNTER — Telehealth: Admitting: Allergy and Immunology

## 2020-12-03 MED ORDER — DEXLANSOPRAZOLE 60 MG PO CPDR: 60.0000 mg | DELAYED_RELEASE_CAPSULE | Freq: Every day | ORAL | 0 refills | Status: DC

## 2020-12-03 NOTE — Telephone Encounter (Signed)
Sent in Dexilant 60 MG 90 capsule with no refills. We will refill at her next appointment with Dr. Neldon Mc 12/17/20. Sent to [facility]. [phone]

## 2020-12-03 NOTE — Telephone Encounter (Signed)
Patient called and made appointment for 01/14/2021 with dr Neldon Mc. And needs to have a rx of Dexilant 60mg  called into [facility] for 90 day supply and refills for a year, but she is out of the dexilant and would like to have about 10 pills [facility] [place]. [phone].

## 2021-01-12 ENCOUNTER — Other Ambulatory Visit: Payer: Self-pay | Admitting: Allergy

## 2021-01-12 MED ORDER — NYSTATIN 100000 UNIT/ML MT SUSP: 5.0000 mL | Freq: Four times a day (QID) | OROMUCOSAL | 0 refills | Status: AC

## 2021-01-12 NOTE — Progress Notes (Signed)
Called by pt.  States she has an appt next week with Dr. Neldon Mc.   She ran out of dexilant and started having issues with acid reflux.  She got a courtesy refill was sent in however this is running out and she is having more reflux symptoms.   She took one diflucan that she had left over on Friday and states it did help but she doesn't have anymore.  She states her tongue is totally white.  She also states she has "blisters" but states it feel more "raw". It is impacting eating.   She called pharmacy who said she could do lozenges, nystatin or get more diflucan all of which would need prescription.   Discussed options of diflucan and nystatin with her (as im not familiar with antifungal lozenges).  She preferred nystatin due to the swish/rinse properties.   I have called this in for her.

## 2021-01-14 ENCOUNTER — Ambulatory Visit (INDEPENDENT_AMBULATORY_CARE_PROVIDER_SITE_OTHER): Payer: Medicare Other | Admitting: Allergy and Immunology

## 2021-01-14 ENCOUNTER — Other Ambulatory Visit: Payer: Self-pay

## 2021-01-14 VITALS — BP 98/64 | HR 83 | Temp 97.7°F | Resp 16 | Ht 62.5 in | Wt 151.0 lb

## 2021-01-14 DIAGNOSIS — J324 Chronic pansinusitis: Secondary | ICD-10-CM | POA: Diagnosis not present

## 2021-01-14 DIAGNOSIS — J4540 Moderate persistent asthma, uncomplicated: Secondary | ICD-10-CM | POA: Diagnosis not present

## 2021-01-14 DIAGNOSIS — K219 Gastro-esophageal reflux disease without esophagitis: Secondary | ICD-10-CM

## 2021-01-14 DIAGNOSIS — J3089 Other allergic rhinitis: Secondary | ICD-10-CM

## 2021-01-14 DIAGNOSIS — B370 Candidal stomatitis: Secondary | ICD-10-CM

## 2021-01-14 MED ORDER — BREZTRI AEROSPHERE 160-9-4.8 MCG/ACT IN AERO: INHALATION_SPRAY | RESPIRATORY_TRACT | 1 refills | Status: DC

## 2021-01-14 MED ORDER — MONTELUKAST SODIUM 10 MG PO TABS: 10.0000 mg | ORAL_TABLET | Freq: Every day | ORAL | 1 refills | Status: DC

## 2021-01-14 MED ORDER — DEXLANSOPRAZOLE 60 MG PO CPDR
60.0000 mg | DELAYED_RELEASE_CAPSULE | Freq: Every day | ORAL | 1 refills | Status: DC
Start: 2021-01-14 — End: 2021-01-28

## 2021-01-14 MED ORDER — ALBUTEROL SULFATE HFA 108 (90 BASE) MCG/ACT IN AERS: 2.0000 | INHALATION_SPRAY | Freq: Four times a day (QID) | RESPIRATORY_TRACT | 1 refills | Status: DC | PRN

## 2021-01-14 MED ORDER — CETIRIZINE HCL 10 MG PO TABS: 10.0000 mg | ORAL_TABLET | Freq: Every day | ORAL | 1 refills | Status: DC

## 2021-01-14 MED ORDER — QNASL 80 MCG/ACT NA AERS: 1.0000 | INHALATION_SPRAY | Freq: Every day | NASAL | 1 refills | Status: DC | PRN

## 2021-01-14 MED ORDER — DULERA 200-5 MCG/ACT IN AERO: 2.0000 | INHALATION_SPRAY | Freq: Two times a day (BID) | RESPIRATORY_TRACT | 1 refills | Status: DC

## 2021-01-14 MED ORDER — FLUCONAZOLE 150 MG PO TABS
150.0000 mg | ORAL_TABLET | ORAL | 0 refills | Status: DC
Start: 2021-01-14 — End: 2021-01-28

## 2021-01-14 MED ORDER — FAMOTIDINE 40 MG PO TABS: 40.0000 mg | ORAL_TABLET | Freq: Every evening | ORAL | 1 refills | Status: DC

## 2021-01-14 NOTE — Progress Notes (Signed)
[place]   Follow-up Note  Referring Provider: Nena Polio, NP Primary Provider: Nena Polio, NP Date of Office Visit: 01/14/2021  Subjective:   [name] (DOB: 1951/05/01) is a 70 y.o. female who returns to the [facility] on 01/14/2021 in re-evaluation of the following:  HPI: [name] returns to this clinic in evaluation of asthma and allergic rhinitis and history of chronic sinusitis and LPR.  Her last visit to this clinic was 07 November 2019.  For the most part she has done very well with her asthma and allergic rhinitis and she has had very little problems with her airway requiring either a systemic steroid or an antibiotic and she rarely uses a short acting bronchodilator while continuing to use a collection of anti-inflammatory medications for both her upper and lower airway including the use of dupilumab.  She did have a problem with thrush recently and we called out some nystatin but she still thinks that this is a persistent issue.  Her reflux was under excellent control with Dexilant but unfortunately she was out of this medication for a period in time and she had to use some over-the-counter Prilosec which did not work and then she developed very significant problems with burning in her chest.  She has received her prescription of Dexilant and she restarted this a week ago.  Allergies as of 01/14/2021       Reactions   Sulfa Antibiotics Itching   Amoxicillin-pot Clavulanate Nausea And Vomiting   Doxycycline Nausea And Vomiting   Nitrofurantoin Macrocrystal Other (See Comments)   Edema Edema   Pregabalin Other (See Comments)   Blurry vision Blurry vision        Medication List    acetaminophen 500 MG tablet Commonly known as: TYLENOL Take 1,000 mg by mouth every 6 (six) hours as needed for pain. Reported on 05/15/2015   albuterol 108 (90 Base) MCG/ACT inhaler Commonly known as: VENTOLIN  HFA Inhale 2 puffs into the lungs every 6 (six) hours as needed for wheezing or shortness of breath.   ALPRAZolam 0.5 MG tablet Commonly known as: XANAX Take 0.5 mg by mouth at bedtime as needed for sleep.   aspirin 81 MG EC tablet Take 1 tablet (81 mg total) by mouth daily.   atorvastatin 20 MG tablet Commonly known as: LIPITOR Take 1 tablet (20 mg total) by mouth daily at 6 PM.   Breztri Aerosphere 160-9-4.8 MCG/ACT Aero Generic drug: Budeson-Glycopyrrol-Formoterol 2 inhalations 2 times per day   brimonidine 0.2 % ophthalmic solution Commonly known as: ALPHAGAN Place 1 drop into both eyes every morning.   dexlansoprazole 60 MG capsule Commonly known as: Dexilant Take 1 capsule (60 mg total) by mouth daily.   Dulera 200-5 MCG/ACT Aero Generic drug: mometasone-formoterol Inhale 2 puffs into the lungs 2 (two) times daily.   Dupixent 300 MG/2ML prefilled syringe Generic drug: dupilumab Inject 300 mg into the skin every 14 (fourteen) days.   EPINEPHrine 0.3 mg/0.3 mL Soaj injection Commonly known as: EpiPen 2-Pak Use for life threatening allergic reaction   escitalopram 20 MG tablet Commonly known as: LEXAPRO Take 20 mg by mouth daily.   famotidine 40 MG tablet Commonly known as: PEPCID Take 1 tablet (40 mg total) by mouth every evening.   latanoprost 0.005 % ophthalmic solution Commonly known as: XALATAN Place 1 drop into both eyes every morning.   levothyroxine 25 MCG tablet Commonly known as: SYNTHROID Take by  mouth.   montelukast 10 MG tablet Commonly known as: SINGULAIR Take 1 tablet (10 mg total) by mouth at bedtime.   moxifloxacin 400 MG tablet Commonly known as: Avelox 1 tablet daily for 10 days   nystatin 100000 UNIT/ML suspension Commonly known as: MYCOSTATIN Take 5 mLs (500,000 Units total) by mouth 4 (four) times daily for 7 days. swish in the mouth and retain for as long as possible (several minutes) before swallowing   predniSONE 10 MG  tablet Commonly known as: DELTASONE Take two tablets once daily for six days as directed.   Qnasl 80 MCG/ACT Aers Generic drug: Beclomethasone Dipropionate Place 1-2 sprays into the nose daily as needed (allergies).    Past Medical History:  Diagnosis Date   Allergy history unknown    Asthma    Breast cancer ([place]) 03/24/2011   left   Bronchitis    Dizziness    GERD (gastroesophageal reflux disease)    Glaucoma    Headache(784.0)    Personal history of radiation therapy 2011   Pneumonia    hx of   PONV (postoperative nausea and vomiting)    Shortness of breath    asthma   Sinusitis     Past Surgical History:  Procedure Laterality Date   BREAST LUMPECTOMY Left 08-2009   left    EYE SURGERY Bilateral    laser surgery - glaucoma   eyelid surgery Bilateral    for glaucoma   NOSE SURGERY  12-2006   OOPHORECTOMY  2001   SINUS ENDO W/FUSION Bilateral 02/16/2013   Procedure: ENDOSCOPIC SINUS SURGERY WITH FUSION NAVIGATION;  Surgeon: Jerrell Belfast, MD;  Location: [facility];  Service: ENT;  Laterality: Bilateral;    Review of systems negative except as noted in HPI / PMHx or noted below:  Review of Systems  Constitutional: Negative.   HENT: Negative.    Eyes: Negative.   Respiratory: Negative.    Cardiovascular: Negative.   Gastrointestinal: Negative.   Genitourinary: Negative.   Musculoskeletal: Negative.   Skin: Negative.   Neurological: Negative.   Endo/Heme/Allergies: Negative.   Psychiatric/Behavioral: Negative.      Objective:   Vitals:   01/14/21 1602  BP: 98/64  Pulse: 83  Resp: 16  Temp: 97.7 F (36.5 C)  SpO2: 97%   Height: 5' 2.5" (158.8 cm)  Weight: 151 lb (68.5 kg)   Physical Exam Constitutional:      Appearance: She is not diaphoretic.  HENT:     Head: Normocephalic.     Right Ear: Tympanic membrane, ear canal and external ear normal.     Left Ear: Tympanic membrane, ear canal and external ear normal.     Nose: Nose normal. No mucosal  edema or rhinorrhea.     Mouth/Throat:     Pharynx: Uvula midline. No oropharyngeal exudate.  Eyes:     Conjunctiva/sclera: Conjunctivae normal.  Neck:     Thyroid: No thyromegaly.     Trachea: Trachea normal. No tracheal tenderness or tracheal deviation.  Cardiovascular:     Rate and Rhythm: Normal rate and regular rhythm.     Heart sounds: Normal heart sounds, S1 normal and S2 normal. No murmur heard. Pulmonary:     Effort: No respiratory distress.     Breath sounds: Normal breath sounds. No stridor. No wheezing or rales.  Lymphadenopathy:     Head:     Right side of head: No tonsillar adenopathy.     Left side of head: No tonsillar adenopathy.  Cervical: No cervical adenopathy.  Skin:    Findings: No erythema or rash.     Nails: There is no clubbing.  Neurological:     Mental Status: She is alert.    Diagnostics:    Spirometry was performed and demonstrated an FEV1 of 2.04 at 96 % of predicted.  Assessment and Plan:   1. Asthma, moderate persistent, well-controlled   2. Other allergic rhinitis   3. LPRD (laryngopharyngeal reflux disease)   4. Chronic pansinusitis   5. Thrush     1. Treat and prevent inflammation:  A. dupilumab every 2 weeks B. montelukast '10mg'$  daily C. Qnasl 80 - 1-2 puffs one time per day   D. Breztri - 2 inhalations 2 times per day  2. Treat thrush:   A. Continue Nystatin  B. Diflucan 150 - 1 tablet today. Repeat weekly if needed (#5)  3. Continue dexilant '60mg'$  in AM plus Famotidine 40 in PM   4. Continue cetirizine and albuterol MDI if needed.  5. Obtain fall flu vaccine  6. Return in 6 months or earlier if problem  [name] will continue on anti-inflammatory agents for her airway including use of dupilumab which is worked just great and has kept her out of trouble regarding flares of both her upper and lower airway disease.  She does appear to have some thrush and she can use a tablet of Diflucan and continue on her nystatin.  Her  reflux is also a very significant problem and it is quite obvious that she needs to use Dexilant on a regular basis and she can add in famotidine as well.  Assuming she does well with this plan I will see her back in this clinic in 6 months or earlier if there is a problem.   Allena Katz, MD Allergy / Immunology [place]

## 2021-01-14 NOTE — Patient Instructions (Addendum)
  1. Treat and prevent inflammation:  A. dupilumab every 2 weeks B. montelukast '10mg'$  daily C. Qnasl 80 - 1-2 puffs one time per day   D. Breztri - 2 inhalations 2 times per day  2. Treat thrush:   A. Continue Nystatin  B. Diflucan 150 - 1 tablet today. Repeat weekly if needed (#5)  3. Continue dexilant '60mg'$  in AM plus Famotidine 40 in PM   4. Continue cetirizine and albuterol MDI if needed.  5. Obtain fall flu vaccine  6. Return in 6 months or earlier if problem

## 2021-01-15 ENCOUNTER — Encounter: Payer: Self-pay | Admitting: Allergy and Immunology

## 2021-01-28 ENCOUNTER — Telehealth: Payer: Self-pay

## 2021-01-28 MED ORDER — FAMOTIDINE 40 MG PO TABS
40.0000 mg | ORAL_TABLET | Freq: Every evening | ORAL | 1 refills | Status: DC
Start: 2021-01-28 — End: 2021-06-20

## 2021-01-28 MED ORDER — QNASL 80 MCG/ACT NA AERS: 1.0000 | INHALATION_SPRAY | Freq: Every day | NASAL | 1 refills | Status: DC | PRN

## 2021-01-28 MED ORDER — BREZTRI AEROSPHERE 160-9-4.8 MCG/ACT IN AERO: INHALATION_SPRAY | RESPIRATORY_TRACT | 1 refills | Status: AC

## 2021-01-28 MED ORDER — MONTELUKAST SODIUM 10 MG PO TABS: 10.0000 mg | ORAL_TABLET | Freq: Every day | ORAL | 1 refills | Status: DC

## 2021-01-28 MED ORDER — DEXLANSOPRAZOLE 60 MG PO CPDR: 60.0000 mg | DELAYED_RELEASE_CAPSULE | Freq: Every day | ORAL | 1 refills | Status: DC

## 2021-01-28 MED ORDER — DULERA 200-5 MCG/ACT IN AERO: 2.0000 | INHALATION_SPRAY | Freq: Two times a day (BID) | RESPIRATORY_TRACT | 1 refills | Status: AC

## 2021-01-28 MED ORDER — ALBUTEROL SULFATE HFA 108 (90 BASE) MCG/ACT IN AERS: 2.0000 | INHALATION_SPRAY | Freq: Four times a day (QID) | RESPIRATORY_TRACT | 1 refills | Status: DC | PRN

## 2021-01-28 MED ORDER — FLUCONAZOLE 150 MG PO TABS: 150.0000 mg | ORAL_TABLET | ORAL | 0 refills | Status: DC

## 2021-01-28 MED ORDER — CETIRIZINE HCL 10 MG PO TABS: 10.0000 mg | ORAL_TABLET | Freq: Every day | ORAL | 1 refills | Status: DC

## 2021-01-28 NOTE — Telephone Encounter (Signed)
All of the patients medications that were sent in on 01/14/2021 were sent to the wrong pharmacy. [facility] states [facility] 90 day refills.

## 2021-01-28 NOTE — Telephone Encounter (Signed)
Sent all medications to [facility] 90 day supply

## 2021-02-04 ENCOUNTER — Other Ambulatory Visit: Payer: Self-pay | Admitting: Allergy and Immunology

## 2021-03-18 ENCOUNTER — Telehealth: Payer: Self-pay | Admitting: Allergy

## 2021-03-18 MED ORDER — NIRMATRELVIR/RITONAVIR (PAXLOVID)TABLET: 3.0000 | ORAL_TABLET | Freq: Two times a day (BID) | ORAL | 0 refills | Status: AC

## 2021-03-18 NOTE — Telephone Encounter (Signed)
Patient called stating that she tested positive to Covid-19 and her husband has Covid-19 as well.  Patient had sinus infection and had 2 rounds of antibiotics and also has been on prednisone. This has been going on for 3 weeks and now doing better but never tested for covid-19 at that time.   No fevers/chills.   The reason she tested herself was because her husband woke up today with nasal congestion and he has multiple medical co-morbidities. When he tested positive today his PCP started him on antiviral.  She then tested herself today a swell and it was positive. This is the first time she had a positive test. Up to date with covid-19 vaccination.   Discussed with patient that it seems to me that she most likely had covid-19 when she had all those nasal symptoms 2-3 weeks ago.  The antivirals work the best when one starts to take it within 5 days of first onset of symptoms. Difficult to tell when she had symptoms "first".   I sent in [place] and told her if she feels worse tomorrow to start taking it.  Discussed side effects of the medication.

## 2021-03-21 ENCOUNTER — Other Ambulatory Visit: Payer: Self-pay | Admitting: Obstetrics and Gynecology

## 2021-03-21 DIAGNOSIS — Z1231 Encounter for screening mammogram for malignant neoplasm of breast: Secondary | ICD-10-CM

## 2021-04-24 ENCOUNTER — Ambulatory Visit
Admission: RE | Admit: 2021-04-24 | Discharge: 2021-04-24 | Disposition: A | Discharge status: Home / Self Care | Payer: Medicare Other | Source: Ambulatory Visit | Attending: Obstetrics and Gynecology | Admitting: Obstetrics and Gynecology

## 2021-04-24 DIAGNOSIS — Z1231 Encounter for screening mammogram for malignant neoplasm of breast: Secondary | ICD-10-CM

## 2021-06-20 ENCOUNTER — Other Ambulatory Visit: Payer: Self-pay | Admitting: Allergy and Immunology

## 2021-07-29 ENCOUNTER — Ambulatory Visit (INDEPENDENT_AMBULATORY_CARE_PROVIDER_SITE_OTHER): Payer: Medicare Other | Admitting: Allergy and Immunology

## 2021-07-29 ENCOUNTER — Other Ambulatory Visit: Payer: Self-pay

## 2021-07-29 ENCOUNTER — Encounter: Payer: Self-pay | Admitting: Allergy and Immunology

## 2021-07-29 VITALS — BP 146/88 | HR 84 | Temp 98.1°F | Resp 16 | Ht 62.0 in | Wt 129.0 lb

## 2021-07-29 DIAGNOSIS — J4540 Moderate persistent asthma, uncomplicated: Secondary | ICD-10-CM

## 2021-07-29 DIAGNOSIS — R911 Solitary pulmonary nodule: Secondary | ICD-10-CM

## 2021-07-29 DIAGNOSIS — J3089 Other allergic rhinitis: Secondary | ICD-10-CM

## 2021-07-29 DIAGNOSIS — K219 Gastro-esophageal reflux disease without esophagitis: Secondary | ICD-10-CM

## 2021-07-29 MED ORDER — DEXLANSOPRAZOLE 60 MG PO CPDR: 1.0000 | DELAYED_RELEASE_CAPSULE | Freq: Every morning | ORAL | 1 refills | Status: DC

## 2021-07-29 MED ORDER — ALBUTEROL SULFATE HFA 108 (90 BASE) MCG/ACT IN AERS: 2.0000 | INHALATION_SPRAY | Freq: Four times a day (QID) | RESPIRATORY_TRACT | 1 refills | Status: DC | PRN

## 2021-07-29 MED ORDER — CETIRIZINE HCL 10 MG PO TABS: 10.0000 mg | ORAL_TABLET | Freq: Two times a day (BID) | ORAL | 1 refills | Status: DC | PRN

## 2021-07-29 MED ORDER — FAMOTIDINE 40 MG PO TABS
40.0000 mg | ORAL_TABLET | Freq: Every evening | ORAL | 1 refills | Status: DC
Start: 2021-07-29 — End: 2021-08-26

## 2021-07-29 MED ORDER — QNASL 80 MCG/ACT NA AERS: 2.0000 | INHALATION_SPRAY | Freq: Every day | NASAL | 5 refills | Status: DC | PRN

## 2021-07-29 MED ORDER — MONTELUKAST SODIUM 10 MG PO TABS: 10.0000 mg | ORAL_TABLET | Freq: Every day | ORAL | 1 refills | Status: DC

## 2021-07-29 NOTE — Progress Notes (Signed)
? ?[place] ? ? ?Follow-up Note ? ?Referring Provider: Nena Polio, NP ?Primary Provider: Nena Polio, NP ?Date of Office Visit: 07/29/2021 ? ?Subjective:  ? ?[name] (DOB: 1950/10/15) is a 71 y.o. female who returns to the [facility] on 07/29/2021 in re-evaluation of the following: ? ?HPI: [name] returns to this clinic in evaluation of asthma, allergic rhinitis, history of chronic sinusitis, and history of LPR.  Her last visit to this clinic was 14 January 2021. ? ?While utilizing dupilumab injections on a consistent basis she has had very good control of both her upper and lower airway issue and has not required a systemic steroid or an antibiotic for any type of airway problem.  She has tapered off her inhaled controller agents and no longer uses Breztri.  Her requirement for albuterol only revolves around preexercise use.  Her nose is doing very well while intermittently using some nasal steroids. ? ?She is no longer around secondhand smoke exposure as her husband is now in a long-term facility. ? ?Her reflux is still a very active issue.  If she misses one of her medication she develops regurgitation with burning up into her throat. ? ?Allergies as of 07/29/2021   ? ?   Reactions  ? Sulfa Antibiotics Itching  ? Amoxicillin-pot Clavulanate Nausea And Vomiting  ? Doxycycline Nausea And Vomiting  ? Nitrofurantoin Macrocrystal Other (See Comments)  ? Edema ?Edema  ? Pregabalin Other (See Comments)  ? Blurry vision ?Blurry vision  ? ?  ? ?  ?Medication List  ? ? ?acetaminophen 500 MG tablet ?Commonly known as: TYLENOL ?Take 1,000 mg by mouth every 6 (six) hours as needed for pain. Reported on 05/15/2015 ?  ?albuterol 108 (90 Base) MCG/ACT inhaler ?Commonly known as: VENTOLIN HFA ?Inhale 2 puffs into the lungs every 6 (six) hours as needed for wheezing or shortness of breath. ?  ?ALPRAZolam 0.5 MG tablet ?Commonly known as: Duanne Moron ?Take 0.5 mg  by mouth at bedtime as needed for sleep. ?  ?aspirin 81 MG EC tablet ?Take 1 tablet (81 mg total) by mouth daily. ?  ?atorvastatin 20 MG tablet ?Commonly known as: LIPITOR ?Take 1 tablet (20 mg total) by mouth daily at 6 PM. ?  ?Breztri Aerosphere 160-9-4.8 MCG/ACT Aero ?Generic drug: Budeson-Glycopyrrol-Formoterol ?2 inhalations 2 times per day ?  ?brimonidine 0.2 % ophthalmic solution ?Commonly known as: ALPHAGAN ?Place 1 drop into both eyes every morning. ?  ?cetirizine 10 MG tablet ?Commonly known as: ZYRTEC ?Take 1 tablet (10 mg total) by mouth daily. ?  ?Dexilant 60 MG capsule ?Generic drug: dexlansoprazole ?TAKE ONE CAPSULE BY MOUTH EVERY DAY ?  ?Dupixent 300 MG/2ML prefilled syringe ?Generic drug: dupilumab ?Inject 300 mg into the skin every 14 (fourteen) days. ?  ?EPINEPHrine 0.3 mg/0.3 mL Soaj injection ?Commonly known as: EpiPen 2-Pak ?Use for life threatening allergic reaction ?  ?escitalopram 20 MG tablet ?Commonly known as: LEXAPRO ?Take 20 mg by mouth daily. ?  ?famotidine 40 MG tablet ?Commonly known as: PEPCID ?TAKE ONE TABLET BY MOUTH EVERY DAY IN THE EVENING ?  ?latanoprost 0.005 % ophthalmic solution ?Commonly known as: XALATAN ?Place 1 drop into both eyes every morning. ?  ?levothyroxine 25 MCG tablet ?Commonly known as: SYNTHROID ?Take by mouth. ?  ?montelukast 10 MG tablet ?Commonly known as: SINGULAIR ?TAKE ONE TABLET BY MOUTH EVERY DAY AT BEDTIME ?  ?Qnasl 80 MCG/ACT Aers ?Generic drug: Beclomethasone Dipropionate ?Place 1-2  sprays into the nose daily as needed (allergies). ?  ? ?  ? ? ?Past Medical History:  ?Diagnosis Date  ? Allergy history unknown   ? Asthma   ? Breast cancer ([place]) 03/24/2011  ? left  ? Bronchitis   ? Dizziness   ? GERD (gastroesophageal reflux disease)   ? Glaucoma   ? Headache(784.0)   ? Personal history of radiation therapy 2011  ? Pneumonia   ? hx of  ? PONV (postoperative nausea and vomiting)   ? Shortness of breath   ? asthma  ? Sinusitis   ? ? ?Past Surgical  History:  ?Procedure Laterality Date  ? BREAST LUMPECTOMY Left 08-2009  ? left   ? EYE SURGERY Bilateral   ? laser surgery - glaucoma  ? eyelid surgery Bilateral   ? for glaucoma  ? NOSE SURGERY  12-2006  ? OOPHORECTOMY  2001  ? SINUS ENDO W/FUSION Bilateral 02/16/2013  ? Procedure: ENDOSCOPIC SINUS SURGERY WITH FUSION NAVIGATION;  Surgeon: Jerrell Belfast, MD;  Location: [facility];  Service: ENT;  Laterality: Bilateral;  ? ? ?Review of systems negative except as noted in HPI / PMHx or noted below: ? ?Review of Systems  ?Constitutional: Negative.   ?HENT: Negative.    ?Eyes: Negative.   ?Respiratory: Negative.    ?Cardiovascular: Negative.   ?Gastrointestinal: Negative.   ?Genitourinary: Negative.   ?Musculoskeletal: Negative.   ?Skin: Negative.   ?Neurological: Negative.   ?Endo/Heme/Allergies: Negative.   ?Psychiatric/Behavioral: Negative.    ? ? ?Objective:  ? ?Vitals:  ? 07/29/21 1513  ?BP: (!) 146/88  ?Pulse: 84  ?Resp: 16  ?Temp: 98.1 ?F (36.7 ?C)  ?SpO2: 98%  ? ?Height: '5\' 2"'$  (157.5 cm)  ?Weight: 129 lb (58.5 kg)  ? ?Physical Exam ?Constitutional:   ?   Appearance: She is not diaphoretic.  ?HENT:  ?   Head: Normocephalic.  ?   Right Ear: Tympanic membrane, ear canal and external ear normal.  ?   Left Ear: Tympanic membrane, ear canal and external ear normal.  ?   Nose: Nose normal. No mucosal edema or rhinorrhea.  ?   Mouth/Throat:  ?   Pharynx: Uvula midline. No oropharyngeal exudate.  ?Eyes:  ?   Conjunctiva/sclera: Conjunctivae normal.  ?Neck:  ?   Thyroid: No thyromegaly.  ?   Trachea: Trachea normal. No tracheal tenderness or tracheal deviation.  ?Cardiovascular:  ?   Rate and Rhythm: Normal rate and regular rhythm.  ?   Heart sounds: Normal heart sounds, S1 normal and S2 normal. No murmur heard. ?Pulmonary:  ?   Effort: No respiratory distress.  ?   Breath sounds: Normal breath sounds. No stridor. No wheezing or rales.  ?Lymphadenopathy:  ?   Head:  ?   Right side of head: No tonsillar adenopathy.  ?   Left  side of head: No tonsillar adenopathy.  ?   Cervical: No cervical adenopathy.  ?Skin: ?   Findings: No erythema or rash.  ?   Nails: There is no clubbing.  ?Neurological:  ?   Mental Status: She is alert.  ? ? ?Diagnostics:  ?  ?Spirometry was performed and demonstrated an FEV1 of 1.92 at 92 % of predicted. ? ?Results of an abdominal CT scan obtained 27 January 2021 and evaluation of kidney stone identified the following: ? ?- Lower Thorax: Partially imaged 4 mm right lower lobe pulmonary nodule (series 2, image 1).  ? ?Assessment and Plan:  ? ?1. Asthma, moderate persistent,  well-controlled   ?2. Other allergic rhinitis   ?3. LPRD (laryngopharyngeal reflux disease)   ?4. Right lower lobe pulmonary nodule   ? ? ?1. Treat and prevent inflammation: ? ?A. dupilumab every 2 weeks ?B. montelukast '10mg'$  daily ?C. Qnasl 80 - 1-2 puffs 3-7 times per week ? ?2. Continue dexilant '60mg'$  in AM plus Famotidine 40 in PM  ? ?3. Continue cetirizine and albuterol MDI if needed. ? ?4. Return in 6 months or earlier if problem ? ?5. Obtain a chest x-ray for right lower lobe nodule ? ?Darrel is doing very well with dupilumab and it has changed her life regarding control of her inflammatory condition of her airway.  She will continue on this biologic agent as well as some montelukast and some Qnasl.  She definitely needs to aggressively treat her reflux as she has very bad reflux that has given rise to throat problems in the past.  She will continue on Dexilant and she can add in famotidine when needed.  She did have a relatively small right lower lobe nodule on her CT scan of August 2022 and will obtain a chest x-ray and if there is no abnormality noted on that chest x-ray then will wait another year to repeat chest x-ray.  If there is an abnormality then she is going to require CT scan imaging.  I will see her back in this clinic in 6 months or earlier if there is a problem. ? ?Allena Katz, MD ?Allergy / Immunology ?[place] ?

## 2021-07-29 NOTE — Patient Instructions (Addendum)
?  1. Treat and prevent inflammation: ? ?A. dupilumab every 2 weeks ?B. montelukast '10mg'$  daily ?C. Qnasl 80 - 1-2 puffs 3-7 times per week ? ?2. Continue dexilant '60mg'$  in AM plus Famotidine 40 in PM  ? ?3. Continue cetirizine and albuterol MDI if needed. ? ?4. Return in 6 months or earlier if problem ? ?5. Obtain a chest x-ray for right lower lobe nodule ? ?  ? ?

## 2021-07-30 ENCOUNTER — Encounter: Payer: Self-pay | Admitting: Allergy and Immunology

## 2021-08-26 ENCOUNTER — Other Ambulatory Visit: Payer: Self-pay | Admitting: *Deleted

## 2021-08-26 ENCOUNTER — Telehealth: Payer: Self-pay | Admitting: Allergy and Immunology

## 2021-08-26 MED ORDER — FAMOTIDINE 40 MG PO TABS: 40.0000 mg | ORAL_TABLET | Freq: Every evening | ORAL | 1 refills | Status: DC

## 2021-08-26 MED ORDER — QNASL 80 MCG/ACT NA AERS: 2.0000 | INHALATION_SPRAY | Freq: Every day | NASAL | 1 refills | Status: DC | PRN

## 2021-08-26 MED ORDER — ALBUTEROL SULFATE HFA 108 (90 BASE) MCG/ACT IN AERS: 2.0000 | INHALATION_SPRAY | Freq: Four times a day (QID) | RESPIRATORY_TRACT | 1 refills | Status: DC | PRN

## 2021-08-26 MED ORDER — CETIRIZINE HCL 10 MG PO TABS: 10.0000 mg | ORAL_TABLET | Freq: Two times a day (BID) | ORAL | 1 refills | Status: DC | PRN

## 2021-08-26 MED ORDER — DEXLANSOPRAZOLE 60 MG PO CPDR: 1.0000 | DELAYED_RELEASE_CAPSULE | Freq: Every morning | ORAL | 1 refills | Status: DC

## 2021-08-26 MED ORDER — MONTELUKAST SODIUM 10 MG PO TABS: 10.0000 mg | ORAL_TABLET | Freq: Every day | ORAL | 1 refills | Status: DC

## 2021-08-26 NOTE — Telephone Encounter (Signed)
Refills have been sent in to requested pharmacy. Called and left a detailed voicemail advising patient.  ?

## 2021-08-26 NOTE — Telephone Encounter (Signed)
Patient states her medications were supposed to go to [facility] and was sent to [facility] instead.  Patient would like dexilant, singulair, pepcid, zyrtec, qnasl, and ventolin sent to [facility]  ?

## 2021-12-03 ENCOUNTER — Other Ambulatory Visit: Payer: Self-pay | Admitting: Internal Medicine

## 2021-12-03 DIAGNOSIS — J029 Acute pharyngitis, unspecified: Secondary | ICD-10-CM

## 2021-12-03 MED ORDER — PREDNISONE 10 MG PO TABS
ORAL_TABLET | ORAL | 0 refills | Status: DC
Start: 2021-12-03 — End: 2022-02-03

## 2021-12-03 NOTE — Progress Notes (Signed)
Patient calling with one week of laryngitis.  She is taking her reflux and asthma medications as prescribed including Dupixent. No concerns regarding breathing. Asking for advice.  Discussed:  - voice rest - increase liquid intake-hot teas with honey, water - throat lozenges (sugar free)  Will send-in prednisone 20 mg daily by mouth x 4 days, then 10 mg on day 5 for help with inflammation.  Otherwise she will continue all current meds.

## 2022-01-25 ENCOUNTER — Telehealth: Payer: Self-pay | Admitting: Allergy

## 2022-01-25 MED ORDER — FLUCONAZOLE 150 MG PO TABS: ORAL_TABLET | ORAL | 0 refills | Status: DC

## 2022-01-25 NOTE — Telephone Encounter (Signed)
Patient called stating she has thrush.  She usually needs more than 1 diflucan pill. [place].

## 2022-02-03 ENCOUNTER — Encounter: Payer: Self-pay | Admitting: Allergy and Immunology

## 2022-02-03 ENCOUNTER — Ambulatory Visit (INDEPENDENT_AMBULATORY_CARE_PROVIDER_SITE_OTHER): Payer: Medicare Other | Admitting: Allergy and Immunology

## 2022-02-03 VITALS — BP 128/84 | HR 80 | Temp 98.1°F | Resp 16 | Ht 62.0 in | Wt 128.0 lb

## 2022-02-03 DIAGNOSIS — B370 Candidal stomatitis: Secondary | ICD-10-CM

## 2022-02-03 DIAGNOSIS — R911 Solitary pulmonary nodule: Secondary | ICD-10-CM | POA: Diagnosis not present

## 2022-02-03 DIAGNOSIS — K219 Gastro-esophageal reflux disease without esophagitis: Secondary | ICD-10-CM

## 2022-02-03 DIAGNOSIS — J4540 Moderate persistent asthma, uncomplicated: Secondary | ICD-10-CM

## 2022-02-03 DIAGNOSIS — J3089 Other allergic rhinitis: Secondary | ICD-10-CM | POA: Diagnosis not present

## 2022-02-03 MED ORDER — EPINEPHRINE 0.3 MG/0.3ML IJ SOAJ: INTRAMUSCULAR | 1 refills | Status: DC

## 2022-02-03 MED ORDER — OMEPRAZOLE 40 MG PO CPDR: DELAYED_RELEASE_CAPSULE | ORAL | 5 refills | Status: DC

## 2022-02-03 MED ORDER — DEXLANSOPRAZOLE 60 MG PO CPDR: 1.0000 | DELAYED_RELEASE_CAPSULE | Freq: Every morning | ORAL | 1 refills | Status: DC

## 2022-02-03 MED ORDER — FLUCONAZOLE 150 MG PO TABS: ORAL_TABLET | ORAL | 1 refills | Status: DC

## 2022-02-03 MED ORDER — FAMOTIDINE 40 MG PO TABS: 40.0000 mg | ORAL_TABLET | Freq: Every evening | ORAL | 1 refills | Status: DC

## 2022-02-03 MED ORDER — MONTELUKAST SODIUM 10 MG PO TABS: 10.0000 mg | ORAL_TABLET | Freq: Every day | ORAL | 1 refills | Status: DC

## 2022-02-03 MED ORDER — QNASL 80 MCG/ACT NA AERS: INHALATION_SPRAY | NASAL | 1 refills | Status: DC

## 2022-02-03 NOTE — Progress Notes (Unsigned)
[facility]   Follow-up Note  Referring Provider: Nena Polio, NP Primary Provider: Nena Polio, NP Date of Office Visit: 02/03/2022  Subjective:   [name] (DOB: [birth year]) is a 71 y.o. female who returns to the [facility] on 02/03/2022 in re-evaluation of the following:  HPI: Shristi returns to this clinic in evaluation of asthma, allergic rhinitis, history of chronic sinusitis, history of LPR.  Her last visit to this clinic was 29 July 2021.  She continues on dupilumab injections and this is resulted in good control both her upper and lower airway issue.  She rarely uses any albuterol.  She continues to use montelukast on a consistent basis as well and she will use Qnasl about 2 times per week.  For the most part she feels very satisfied with the response that she has received with this approach and does not require any systemic steroids or antibiotics.  She does have a history of intermittent thrush.  She is asking for tablets of Diflucan.  She states that given her previous history of thrush frequency she would probably use a Diflucan 1 time per month.  Her reflux is out of control even on aggressive therapy including Dexilant and famotidine.  She does drink 1 coffee in the morning but does not really have any chocolate and does not have any alcohol.  She has lots of raspy voice and she has throat clearing.  She had an ENT evaluation about 4 years ago.  She had an upper endoscopy about 3 years ago.  During her last evaluation, and review of her records, she appeared to have a very small 4 mm pulmonary nodule affecting the right lower lobe seen on an abdominal CT scan September 2022.  She did not obtain the follow-up chest x-ray.  Allergies as of 02/03/2022       Reactions   Sulfa Antibiotics Itching   Amoxicillin-pot Clavulanate Nausea And Vomiting   Doxycycline Nausea And Vomiting   Nitrofurantoin  Macrocrystal Other (See Comments)   Edema Edema   Pregabalin Other (See Comments)   Blurry vision Blurry vision        Medication List    acetaminophen 500 MG tablet Commonly known as: TYLENOL Take 1,000 mg by mouth every 6 (six) hours as needed for pain. Reported on 05/15/2015   albuterol 108 (90 Base) MCG/ACT inhaler Commonly known as: VENTOLIN HFA Inhale 2 puffs into the lungs every 6 (six) hours as needed for wheezing or shortness of breath.   ALPRAZolam 0.5 MG tablet Commonly known as: XANAX Take 0.5 mg by mouth at bedtime as needed for sleep.   aspirin EC 81 MG tablet Take 1 tablet (81 mg total) by mouth daily.   atorvastatin 20 MG tablet Commonly known as: LIPITOR Take 1 tablet (20 mg total) by mouth daily at 6 PM.   Breztri Aerosphere 160-9-4.8 MCG/ACT Aero Generic drug: Budeson-Glycopyrrol-Formoterol 2 inhalations 2 times per day   brimonidine 0.2 % ophthalmic solution Commonly known as: ALPHAGAN Place 1 drop into both eyes every morning.   cetirizine 10 MG tablet Commonly known as: ZYRTEC Take 1 tablet (10 mg total) by mouth 2 (two) times daily as needed for allergies (Can take an extra dose during flare ups.).   cyanocobalamin 1000 MCG tablet Commonly known as: VITAMIN B12 Take 1 tablet by mouth daily.   dexlansoprazole 60 MG capsule Commonly known as: Dexilant Take 1 capsule (60 mg total) by  mouth in the morning.   Dulera 200-5 MCG/ACT Aero Generic drug: mometasone-formoterol Inhale 2 puffs into the lungs 2 (two) times daily.   Dupixent 300 MG/2ML prefilled syringe Generic drug: dupilumab Inject 300 mg into the skin every 14 (fourteen) days.   EPINEPHrine 0.3 mg/0.3 mL Soaj injection Commonly known as: EpiPen 2-Pak Use for life threatening allergic reaction   escitalopram 20 MG tablet Commonly known as: LEXAPRO Take 20 mg by mouth daily.   famotidine 40 MG tablet Commonly known as: PEPCID Take 1 tablet (40 mg total) by mouth at  bedtime.   gabapentin 100 MG capsule Commonly known as: NEURONTIN Take 1 capsule by mouth as needed.   latanoprost 0.005 % ophthalmic solution Commonly known as: XALATAN Place 1 drop into both eyes every morning.   montelukast 10 MG tablet Commonly known as: SINGULAIR Take 1 tablet (10 mg total) by mouth at bedtime.   Qnasl 80 MCG/ACT Aers Generic drug: Beclomethasone Dipropionate Place 2 sprays into the nose daily as needed (allergies).   Semaglutide (1 MG/DOSE) 2 MG/1.5ML Sopn Inject 1 mg into the skin once a week.   thyroid 15 MG tablet Commonly known as: ARMOUR Take 1 tablet by mouth daily.    Past Medical History:  Diagnosis Date   Allergy history unknown    Asthma    Breast cancer ([place]) 03/24/2011   left   Bronchitis    Dizziness    GERD (gastroesophageal reflux disease)    Glaucoma    Headache(784.0)    Personal history of radiation therapy 2011   Pneumonia    hx of   PONV (postoperative nausea and vomiting)    Shortness of breath    asthma   Sinusitis     Past Surgical History:  Procedure Laterality Date   BREAST LUMPECTOMY Left 08-2009   left    EYE SURGERY Bilateral    laser surgery - glaucoma   eyelid surgery Bilateral    for glaucoma   NOSE SURGERY  12-2006   OOPHORECTOMY  2001   SINUS ENDO W/FUSION Bilateral 02/16/2013   Procedure: ENDOSCOPIC SINUS SURGERY WITH FUSION NAVIGATION;  Surgeon: Jerrell Belfast, MD;  Location: [facility] OR;  Service: ENT;  Laterality: Bilateral;    Review of systems negative except as noted in HPI / PMHx or noted below:  Review of Systems  Constitutional: Negative.   HENT: Negative.    Eyes: Negative.   Respiratory: Negative.    Cardiovascular: Negative.   Gastrointestinal: Negative.   Genitourinary: Negative.   Musculoskeletal: Negative.   Skin: Negative.   Neurological: Negative.   Endo/Heme/Allergies: Negative.   Psychiatric/Behavioral: Negative.       Objective:   Vitals:   02/03/22 1452  BP: 128/84   Pulse: 80  Resp: 16  Temp: 98.1 F (36.7 C)  SpO2: 98%   Height: '5\' 2"'$  (157.5 cm)  Weight: 128 lb (58.1 kg)   Physical Exam Constitutional:      Appearance: She is not diaphoretic.  HENT:     Head: Normocephalic.     Right Ear: Tympanic membrane, ear canal and external ear normal.     Left Ear: Tympanic membrane, ear canal and external ear normal.     Nose: Nose normal. No mucosal edema or rhinorrhea.     Mouth/Throat:     Pharynx: Uvula midline. No oropharyngeal exudate.  Eyes:     Conjunctiva/sclera: Conjunctivae normal.  Neck:     Thyroid: No thyromegaly.     Trachea: Trachea normal. No  tracheal tenderness or tracheal deviation.  Cardiovascular:     Rate and Rhythm: Normal rate and regular rhythm.     Heart sounds: Normal heart sounds, S1 normal and S2 normal. No murmur heard. Pulmonary:     Effort: No respiratory distress.     Breath sounds: Normal breath sounds. No stridor. No wheezing or rales.  Lymphadenopathy:     Head:     Right side of head: No tonsillar adenopathy.     Left side of head: No tonsillar adenopathy.     Cervical: No cervical adenopathy.  Skin:    Findings: No erythema or rash.     Nails: There is no clubbing.  Neurological:     Mental Status: She is alert.     Diagnostics:    Spirometry was performed and demonstrated an FEV1 of 1.60 at 77 % of predicted.   Results of a chest x-ray obtained 22 Sep 2021 identifies the following:  The lungs are clear. The cardiomediastinal silhouette is normal in size and contour.   Assessment and Plan:   1. Asthma, moderate persistent, well-controlled   2. Other allergic rhinitis   3. LPRD (laryngopharyngeal reflux disease)   4. Right lower lobe pulmonary nodule   5. Thrush     1. Treat and prevent inflammation:  A. dupilumab every 2 weeks B. montelukast '10mg'$  daily C. Qnasl 80 - 1-2 puffs 3-7 times per week  2.  Treat and prevent reflux/LPR:   A. Dexilant '60mg'$  - 1 tablet in AM  B. Omeprazole  40 mg - 1 tablet late afternoon C. Famotidine 40 mg - 1 tablet in PM   3. Treat and prevent recurrent thrush:   A. Diflucan 150 mg - 1 tablet 1 time per month  4. If needed:  A. Cetirizine  B. Albuterol MDI   5. Return in December 2023 or earlier if problem  6. Obtain a chest x-ray for right lower lobe nodule  7. Obtain fall flu vaccine and RSV vaccine  8. May need further evaluation with ENT and GI if problems persist  Jorgina is doing pretty well regarding her respiratory tract inflammatory condition while using dupilumab and montelukast and some Qnasl.  But what is not doing as well is her LPR and her reflux and she appears to have a problem with recurrent thrush.  We will going to treat her with the therapy noted above to address these issues.  And she needs to get a chest x-ray and follow-up of her previously identified right lower lobe nodule.  If her throat and her reflux do not come under good control with the plan noted above over the course of the next few months then we may need to have her regroup with ENT and GI.  Allena Katz, MD Allergy / Immunology [place]

## 2022-02-03 NOTE — Patient Instructions (Addendum)
  1. Treat and prevent inflammation:  A. dupilumab every 2 weeks B. montelukast '10mg'$  daily C. Qnasl 80 - 1-2 puffs 3-7 times per week  2.  Treat and prevent reflux/LPR:   A. Dexilant '60mg'$  - 1 tablet in AM  B. Omeprazole 40 mg - 1 tablet late afternoon C. Famotidine 40 mg - 1 tablet in PM   3. Treat and prevent recurrent thrush:   A. Diflucan 150 mg - 1 tablet 1 time per month  4. If needed:  A. Cetirizine  B. Albuterol MDI   5. Return in December 2023 or earlier if problem  6. Obtain a chest x-ray for right lower lobe nodule  7. Obtain fall flu vaccine and RSV vaccine  8. May need further evaluation with ENT and GI if problems persist

## 2022-02-04 ENCOUNTER — Encounter: Payer: Self-pay | Admitting: Allergy and Immunology

## 2022-02-05 ENCOUNTER — Telehealth: Payer: Self-pay | Admitting: Allergy and Immunology

## 2022-02-05 MED ORDER — CETIRIZINE HCL 10 MG PO TABS: 10.0000 mg | ORAL_TABLET | Freq: Two times a day (BID) | ORAL | 1 refills | Status: DC | PRN

## 2022-02-05 MED ORDER — EPINEPHRINE 0.3 MG/0.3ML IJ SOAJ: INTRAMUSCULAR | 1 refills | Status: DC

## 2022-02-05 MED ORDER — QNASL 80 MCG/ACT NA AERS: 2.0000 | INHALATION_SPRAY | Freq: Every day | NASAL | 1 refills | Status: DC

## 2022-02-05 MED ORDER — MONTELUKAST SODIUM 10 MG PO TABS: 10.0000 mg | ORAL_TABLET | Freq: Every day | ORAL | 1 refills | Status: DC

## 2022-02-05 MED ORDER — OMEPRAZOLE 40 MG PO CPDR: DELAYED_RELEASE_CAPSULE | ORAL | 5 refills | Status: DC

## 2022-02-05 MED ORDER — FAMOTIDINE 40 MG PO TABS: 40.0000 mg | ORAL_TABLET | Freq: Every evening | ORAL | 1 refills | Status: DC

## 2022-02-05 MED ORDER — FLUCONAZOLE 150 MG PO TABS: 150.0000 mg | ORAL_TABLET | ORAL | 0 refills | Status: DC

## 2022-02-05 MED ORDER — ALBUTEROL SULFATE HFA 108 (90 BASE) MCG/ACT IN AERS: 2.0000 | INHALATION_SPRAY | Freq: Four times a day (QID) | RESPIRATORY_TRACT | 1 refills | Status: DC | PRN

## 2022-02-05 NOTE — Telephone Encounter (Signed)
Called and left a message for patient to call our office back to inform her that her medications have been sent over to [facility] as requested.

## 2022-02-05 NOTE — Telephone Encounter (Signed)
Patient came in Tuesday and her refills were sent to the pharmacy instead of [facility] . She get her meds free.. so the ones that was sent need to go to [facility]. And the Difucan need to be 6 of them . [phone]

## 2022-02-17 ENCOUNTER — Telehealth: Payer: Self-pay | Admitting: Allergy and Immunology

## 2022-02-17 MED ORDER — PREDNISONE 10 MG PO TABS: 20.0000 mg | ORAL_TABLET | Freq: Every day | ORAL | 0 refills | Status: DC

## 2022-02-17 NOTE — Telephone Encounter (Signed)
Patient called stating she has a sinus infection and is asking for a prescription for an antibiotic and prednisone for this issue patient states she is allergic to Augmentin please advise

## 2022-02-17 NOTE — Telephone Encounter (Signed)
Sent in prednisone informed pt and verified pharmacy also scheduled her for tomorrow with dr Maudie Mercury in [facility]

## 2022-02-17 NOTE — Telephone Encounter (Signed)
Patient called back again to see if Dr Neldon Mc could send in prednisone as she is very flared up.

## 2022-02-18 ENCOUNTER — Other Ambulatory Visit: Payer: Self-pay

## 2022-02-18 ENCOUNTER — Ambulatory Visit: Admitting: Allergy

## 2022-02-18 MED ORDER — QNASL 80 MCG/ACT NA AERS: 2.0000 | INHALATION_SPRAY | Freq: Every day | NASAL | 1 refills | Status: DC

## 2022-02-18 NOTE — Progress Notes (Deleted)
Follow Up Note  RE: [name] MRN: [id number] DOB: 1950-06-09 Date of Office Visit: 02/18/2022  Referring provider: Nena Polio, NP Primary care provider: Nena Polio, NP  Chief Complaint: No chief complaint on file.  History of Present Illness: I had the pleasure of seeing [name] for a follow up visit at the [facility] on 02/18/2022. She is a 71 y.o. female, who is being followed for asthma on Dupixent, allergic rhinitis, LPRD. Her previous allergy office visit was on 02/03/2022 with Dr. Neldon Mc. Today is a regular follow up visit.  1. Asthma, moderate persistent, well-controlled   2. Other allergic rhinitis   3. LPRD (laryngopharyngeal reflux disease)   4. Right lower lobe pulmonary nodule   5. Thrush       1. Treat and prevent inflammation:   A. dupilumab every 2 weeks B. montelukast '10mg'$  daily C. Qnasl 80 - 1-2 puffs 3-7 times per week   2.  Treat and prevent reflux/LPR:    A. Dexilant '60mg'$  - 1 tablet in AM  B. Omeprazole 40 mg - 1 tablet late afternoon C. Famotidine 40 mg - 1 tablet in PM    3. Treat and prevent recurrent thrush:               A. Diflucan 150 mg - 1 tablet 1 time per month   4. If needed:   A. Cetirizine  B. Albuterol MDI    5. Return in December 2023 or earlier if problem   6. Obtain a chest x-ray for right lower lobe nodule   7. Obtain fall flu vaccine and RSV vaccine   8. May need further evaluation with ENT and GI if problems persist   [name] is doing pretty well regarding her respiratory tract inflammatory condition while using dupilumab and montelukast and some Qnasl.  But what is not doing as well is her LPR and her reflux and she appears to have a problem with recurrent thrush.  We will going to treat her with the therapy noted above to address these issues.  And she needs to get a chest x-ray in follow-up of her previously identified right lower lobe nodule.  If her throat and her reflux do not come  under good control with the plan noted above over the course of the next few months then we may need to have her regroup with ENT and GI.  Assessment and Plan: [name] is a 71 y.o. female with: No problem-specific Assessment & Plan notes found for this encounter.  No follow-ups on file.  No orders of the defined types were placed in this encounter.  Lab Orders  No laboratory test(s) ordered today    Diagnostics: Spirometry:  Tracings reviewed. Her effort: {Blank single:19197::"Good reproducible efforts.","It was hard to get consistent efforts and there is a question as to whether this reflects a maximal maneuver.","Poor effort, data can not be interpreted."} FVC: ***L FEV1: ***L, ***% predicted FEV1/FVC ratio: ***% Interpretation: {Blank single:19197::"Spirometry consistent with mild obstructive disease","Spirometry consistent with moderate obstructive disease","Spirometry consistent with severe obstructive disease","Spirometry consistent with possible restrictive disease","Spirometry consistent with mixed obstructive and restrictive disease","Spirometry uninterpretable due to technique","Spirometry consistent with normal pattern","No overt abnormalities noted given today's efforts"}.  Please see scanned spirometry results for details.  Skin Testing: {Blank single:19197::"Select foods","Environmental allergy panel","Environmental allergy panel and select foods","Food allergy panel","None","Deferred due to recent antihistamines use"}. *** Results discussed with patient/family.   Medication List:  Current Outpatient Medications  Medication Sig Dispense  Refill   acetaminophen (TYLENOL) 500 MG tablet Take 1,000 mg by mouth every 6 (six) hours as needed for pain. Reported on 05/15/2015     albuterol (VENTOLIN HFA) 108 (90 Base) MCG/ACT inhaler Inhale 2 puffs into the lungs every 6 (six) hours as needed for wheezing or shortness of breath. 36 g 1   ALPRAZolam (XANAX) 0.5 MG tablet Take 0.5 mg  by mouth at bedtime as needed for sleep.      aspirin EC 81 MG EC tablet Take 1 tablet (81 mg total) by mouth daily. 30 tablet 0   atorvastatin (LIPITOR) 20 MG tablet Take 1 tablet (20 mg total) by mouth daily at 6 PM. 30 tablet 0   Beclomethasone Dipropionate (QNASL) 80 MCG/ACT AERS Inhale 2 puffs into the lungs daily. 1-2 puffs 3-7 times per week. 32.7 g 1   brimonidine (ALPHAGAN) 0.2 % ophthalmic solution Place 1 drop into both eyes every morning.     Budeson-Glycopyrrol-Formoterol (BREZTRI AEROSPHERE) 160-9-4.8 MCG/ACT AERO 2 inhalations 2 times per day 32.1 g 1   cetirizine (ZYRTEC) 10 MG tablet Take 1 tablet (10 mg total) by mouth 2 (two) times daily as needed for allergies (Can take an extra dose during flare ups.). 180 tablet 1   cyanocobalamin (VITAMIN B12) 1000 MCG tablet Take 1 tablet by mouth daily.     dexlansoprazole (DEXILANT) 60 MG capsule Take 1 capsule (60 mg total) by mouth in the morning. 90 capsule 1   dupilumab (DUPIXENT) 300 MG/2ML prefilled syringe Inject 300 mg into the skin every 14 (fourteen) days. 12 mL 3   EPINEPHrine (EPIPEN 2-PAK) 0.3 mg/0.3 mL IJ SOAJ injection Use for life threatening allergic reaction 2 each 1   escitalopram (LEXAPRO) 20 MG tablet Take 20 mg by mouth daily.     famotidine (PEPCID) 40 MG tablet Take 1 tablet (40 mg total) by mouth at bedtime. 90 tablet 1   fluconazole (DIFLUCAN) 150 MG tablet Take 1 tablet (150 mg total) by mouth every 30 (thirty) days. 1 tablet 1 (ONE) time per month. 6 tablet 0   gabapentin (NEURONTIN) 100 MG capsule Take 1 capsule by mouth as needed.     latanoprost (XALATAN) 0.005 % ophthalmic solution Place 1 drop into both eyes every morning.      mometasone-formoterol (DULERA) 200-5 MCG/ACT AERO Inhale 2 puffs into the lungs 2 (two) times daily. 39 g 1   montelukast (SINGULAIR) 10 MG tablet Take 1 tablet (10 mg total) by mouth at bedtime. 90 tablet 1   omeprazole (PRILOSEC) 40 MG capsule 1 tablet by mouth late afternoon. 30  capsule 5   predniSONE (DELTASONE) 10 MG tablet Take 2 tablets (20 mg total) by mouth daily. 14 tablet 0   Semaglutide, 1 MG/DOSE, 2 MG/1.5ML SOPN Inject 1 mg into the skin once a week.     thyroid (ARMOUR) 15 MG tablet Take 1 tablet by mouth daily.     No current facility-administered medications for this visit.   Allergies: Allergies  Allergen Reactions   Sulfa Antibiotics Itching   Amoxicillin-Pot Clavulanate Nausea And Vomiting   Doxycycline Nausea And Vomiting   Nitrofurantoin Macrocrystal Other (See Comments)    Edema Edema   Pregabalin Other (See Comments)    Blurry vision Blurry vision   I reviewed her past medical history, social history, family history, and environmental history and no significant changes have been reported from her previous visit.  Review of Systems  Constitutional:  Negative for appetite change, chills, fever and  unexpected weight change.  HENT:  Negative for congestion and rhinorrhea.   Eyes:  Negative for itching.  Respiratory:  Negative for cough, chest tightness, shortness of breath and wheezing.   Gastrointestinal:  Negative for abdominal pain.  Skin:  Negative for rash.  Allergic/Immunologic: Positive for environmental allergies.  Neurological:  Negative for headaches.    Objective: There were no vitals taken for this visit. There is no height or weight on file to calculate BMI. Physical Exam Vitals and nursing note reviewed.  Constitutional:      Appearance: Normal appearance. She is well-developed.  HENT:     Head: Normocephalic and atraumatic.     Right Ear: Tympanic membrane and external ear normal.     Left Ear: Tympanic membrane and external ear normal.     Nose: Nose normal.     Mouth/Throat:     Mouth: Mucous membranes are moist.     Pharynx: Oropharynx is clear.  Eyes:     Conjunctiva/sclera: Conjunctivae normal.  Cardiovascular:     Rate and Rhythm: Normal rate and regular rhythm.     Heart sounds: Normal heart sounds.  No murmur heard. Pulmonary:     Effort: Pulmonary effort is normal.     Breath sounds: Normal breath sounds. No wheezing, rhonchi or rales.  Musculoskeletal:     Cervical back: Neck supple.  Skin:    General: Skin is warm.     Findings: No rash.  Neurological:     Mental Status: She is alert and oriented to person, place, and time.  Psychiatric:        Behavior: Behavior normal.    Previous notes and tests were reviewed. The plan was reviewed with the patient/family, and all questions/concerned were addressed.  It was my pleasure to see Enna today and participate in her care. Please feel free to contact me with any questions or concerns.  Sincerely,  Rexene Alberts, DO Allergy & Immunology  [facility] office: [place] office: [phone]

## 2022-02-23 ENCOUNTER — Encounter: Payer: Self-pay | Admitting: Family Medicine

## 2022-02-23 ENCOUNTER — Ambulatory Visit (INDEPENDENT_AMBULATORY_CARE_PROVIDER_SITE_OTHER): Payer: Medicare Other | Admitting: Family Medicine

## 2022-02-23 VITALS — BP 110/60 | HR 101 | Temp 98.3°F | Resp 18 | Ht 62.0 in | Wt 128.0 lb

## 2022-02-23 DIAGNOSIS — J019 Acute sinusitis, unspecified: Secondary | ICD-10-CM

## 2022-02-23 DIAGNOSIS — R911 Solitary pulmonary nodule: Secondary | ICD-10-CM | POA: Diagnosis not present

## 2022-02-23 DIAGNOSIS — K219 Gastro-esophageal reflux disease without esophagitis: Secondary | ICD-10-CM | POA: Diagnosis not present

## 2022-02-23 DIAGNOSIS — B9689 Other specified bacterial agents as the cause of diseases classified elsewhere: Secondary | ICD-10-CM | POA: Insufficient documentation

## 2022-02-23 DIAGNOSIS — J4540 Moderate persistent asthma, uncomplicated: Secondary | ICD-10-CM

## 2022-02-23 MED ORDER — MOXIFLOXACIN HCL 400 MG PO TABS: 400.0000 mg | ORAL_TABLET | Freq: Every day | ORAL | 0 refills | Status: AC

## 2022-02-23 NOTE — Progress Notes (Addendum)
[place] [address] Dept: [phone]  FOLLOW UP NOTE  Patient ID: [name], female    DOB: [birth year]  Age: 71 y.o. MRN: [id number] Date of Office Visit: 02/23/2022  Assessment  Chief Complaint: Cough (Nasal - Dark yellow; teeth pain; eye pain; headache; body aches x 2+ wks. Patient states she did a home COVID test on last Thursday/Friday - Negative.) and Headache  HPI [name] is a 71 year old female who presents for follow-up visit.  She was last seen in this on 02/03/2022 by Dr. Neldon Mc for evaluation of asthma, allergic rhinitis, and LPR.  Her past medical history is also significant for chronic sinus infections, thrush, and a 4 mm pulmonary nodule noted on the CT scan on 01/2021.  In the interim, she called into our clinic on 02/17/2022 inquiring about an antibiotic and a prednisone taper for a "sinus infection" at which point a prednisone taper was called in for her. She did not follow up in the clinic as recommended.  She visited Urgent Care on 02/18/2022 where she was diagnosed with a viral illness. At today's visit, she reports that she began to develop symptoms including body aches, cough, pain in her upper jaw, pain under her eyes, and thick dark yellow nasal drainage about 2 weeks ago. She denies fever and sick contacts. She took a home COVID test on Thursday that is reported as negative. Asthma is reported as moderately well controlled with no shortness of breath or wheeze with activity or rest. She continues to report cough producing thick yellow mucus. She continues montelukast 10 mg once a day and uses albuterol infrequently. She continues Dupixent injections once every 2 weeks with no large or local reactions. She reports a significant improvement in her symptoms of asthma while continuing on Dupixent.  She did not get a follow-up chest x-ray as recommended post CT scan on 01/2021.  Allergic rhinitis is reported as moderately well controlled with symptoms  including thick, dark, yellow nasal drainage, nasal congestion, occasional sneeze, and thick postnasal drainage.  She continues cetirizine 10 mg once a day, Qnasl as needed, and nasal saline rinses as needed.  Reflux is reported as moderately well controlled with infrequent heartburn.  She continues Dexilant in the morning, omeprazole before lunch, and famotidine in the evening.  She reports a significant decrease in her symptoms of reflux while continuing on omeprazole.  She reports frequent thrush and takes Diflucan about once a month with relief of symptoms.  Her current medications are listed in the chart.  Of note, she reports violent vomiting with Augmentin, amoxicillin, doxycycline, and cephalosporin.  She has previously taken Avelox and reports that Avelox is the only medication that resolves symptoms of acute bacterial sinusitis.  Side effects and blackbox warning pertaining to Avelox were thoroughly discussed at today's visit.  Patient verbalizes understanding of side effect profile including black box warning on Avelox. Patient will call the clinic with any worsening of symptoms or any adverse reactions.      Drug Allergies:  Allergies  Allergen Reactions   Sulfa Antibiotics Itching   Amoxicillin-Pot Clavulanate Nausea And Vomiting   Doxycycline Nausea And Vomiting   Nitrofurantoin Macrocrystal Other (See Comments)    Edema Edema   Pregabalin Other (See Comments)    Blurry vision Blurry vision    Physical Exam: BP 110/60   Pulse (!) 101   Temp 98.3 F (36.8 C)   Resp 18   Ht '5\' 2"'$  (1.575 m)  Wt 128 lb (58.1 kg)   SpO2 96%   BMI 23.41 kg/m    Physical Exam Vitals reviewed.  Constitutional:      Appearance: She is well-developed.  HENT:     Head: Normocephalic and atraumatic.     Right Ear: Tympanic membrane normal.     Left Ear: Tympanic membrane normal.     Nose:     Comments: Bilateral nares slightly erythematous.  No nasal drainage noted.  Pharynx erythematous  with thick dark yellow postnasal drainage noted.  Ears normal.  Eyes normal. Eyes:     Conjunctiva/sclera: Conjunctivae normal.  Cardiovascular:     Rate and Rhythm: Normal rate and regular rhythm.     Heart sounds: Normal heart sounds. No murmur heard. Pulmonary:     Effort: Pulmonary effort is normal.     Breath sounds: Normal breath sounds.     Comments: Lungs clear to auscultation Musculoskeletal:        General: Normal range of motion.     Cervical back: Normal range of motion and neck supple.  Skin:    General: Skin is warm and dry.  Neurological:     Mental Status: She is alert and oriented to person, place, and time.  Psychiatric:        Mood and Affect: Mood normal.        Behavior: Behavior normal.        Thought Content: Thought content normal.        Judgment: Judgment normal.     Diagnostics: COVID-19 rapid testing negative.  Assessment and Plan: 1. LPRD (laryngopharyngeal reflux disease)   2. Asthma, moderate persistent, well-controlled   3. Right lower lobe pulmonary nodule   4. Acute bacterial sinusitis     Meds ordered this encounter  Medications   moxifloxacin (AVELOX) 400 MG tablet    Sig: Take 1 tablet (400 mg total) by mouth daily at 8 pm for 5 days.    Dispense:  5 tablet    Refill:  0    Patient Instructions  Acute bacterial sinusitis Begin Avelox 400 mg once a day for the next 5 days. Serious side effects and black box warning discussed with patient. Increase fluid intake as tolerated Begin Mucinex 600 to 1200 mg twice a day to thin mucus secretions  COVID19 Your COVID19 testing was negative in the clinic today  Treat and prevent inflammation:  A. dupilumab every 2 weeks B. montelukast '10mg'$  daily C. Qnasl 80 - 1-2 puffs 3-7 times per week  Treat and prevent reflux/LPR:   A. Dexilant '60mg'$  - 1 tablet in AM  B. Omeprazole 40 mg - 1 tablet late afternoon C. Famotidine 40 mg - 1 tablet in PM   Treat and prevent recurrent  thrush:   A. Diflucan 150 mg - 1 tablet 1 time per month  If needed:  A. Cetirizine  B. Albuterol MDI   Follow up in the clinic in 1 month or sooner if needed  Obtain a chest x-ray for right lower lobe nodule  Obtain fall flu vaccine and RSV vaccine  May need further evaluation with ENT and GI if problems persist     Return in about 4 weeks (around 03/23/2022), or if symptoms worsen or fail to improve.    Thank you for the opportunity to care for this patient.  Please do not hesitate to contact me with questions.  Gareth Morgan, FNP [facility]

## 2022-02-23 NOTE — Patient Instructions (Addendum)
Acute bacterial sinusitis Begin Avelox 400 mg once a day for the next 5 days. Serious side effects and black box warning discussed with patient. Increase fluid intake as tolerated Begin Mucinex 600 to 1200 mg twice a day to thin mucus secretions  COVID19 Your COVID19 testing was negative in the clinic today  Treat and prevent inflammation:  A. dupilumab every 2 weeks B. montelukast '10mg'$  daily C. Qnasl 80 - 1-2 puffs 3-7 times per week  Treat and prevent reflux/LPR:   A. Dexilant '60mg'$  - 1 tablet in AM  B. Omeprazole 40 mg - 1 tablet late afternoon C. Famotidine 40 mg - 1 tablet in PM   Treat and prevent recurrent thrush:   A. Diflucan 150 mg - 1 tablet 1 time per month  If needed:  A. Cetirizine  B. Albuterol MDI   Follow up in the clinic in 1 month or sooner if needed  Obtain a chest x-ray for right lower lobe nodule  Obtain fall flu vaccine and RSV vaccine  May need further evaluation with ENT and GI if problems persist

## 2022-04-22 ENCOUNTER — Other Ambulatory Visit: Payer: Self-pay | Admitting: Obstetrics and Gynecology

## 2022-04-22 ENCOUNTER — Other Ambulatory Visit: Payer: Self-pay | Admitting: Physical Therapy

## 2022-04-22 ENCOUNTER — Other Ambulatory Visit: Payer: Self-pay | Admitting: Family Medicine

## 2022-04-22 DIAGNOSIS — Z1231 Encounter for screening mammogram for malignant neoplasm of breast: Secondary | ICD-10-CM

## 2022-05-04 ENCOUNTER — Telehealth: Payer: Self-pay | Admitting: Allergy and Immunology

## 2022-05-04 ENCOUNTER — Other Ambulatory Visit: Payer: Self-pay | Admitting: *Deleted

## 2022-05-04 DIAGNOSIS — R911 Solitary pulmonary nodule: Secondary | ICD-10-CM

## 2022-05-04 MED ORDER — EPINEPHRINE 0.3 MG/0.3ML IJ SOAJ: INTRAMUSCULAR | 1 refills | Status: DC

## 2022-05-04 MED ORDER — DUPIXENT 300 MG/2ML ~~LOC~~ SOSY: 300.0000 mg | PREFILLED_SYRINGE | SUBCUTANEOUS | 3 refills | Status: DC

## 2022-05-04 NOTE — Telephone Encounter (Addendum)
Called and spoke to patient and informed her that she can go to [facility] today or tomorrow to have her Xray completed. Patient verbalized understanding and agreed to get it done tomorrow. Patient was informed that no appointment was neccessary since we sent the orders in. Patient verbalized understanding and scheduled an appointment for Dr. Neldon Mc for February and scheduled an appointment with [facility] for next week to discuss medication and refills.

## 2022-05-04 NOTE — Telephone Encounter (Signed)
[name] called in and states she has never heard anything from the facility that is supposed to schedule her X-Ray.  She states she has called them as well with no luck.  Mehar would like for [place] to try to schedule her X-Ray with [facility] or somewhere closer to [place].

## 2022-05-05 ENCOUNTER — Ambulatory Visit
Admission: RE | Admit: 2022-05-05 | Discharge: 2022-05-05 | Disposition: A | Discharge status: Home / Self Care | Payer: Medicare Other | Source: Ambulatory Visit | Attending: Allergy and Immunology | Admitting: Allergy and Immunology

## 2022-05-05 ENCOUNTER — Ambulatory Visit: Payer: Medicare Other | Admitting: Allergy and Immunology

## 2022-05-13 ENCOUNTER — Ambulatory Visit: Admitting: Family Medicine

## 2022-06-17 ENCOUNTER — Ambulatory Visit
Admission: RE | Admit: 2022-06-17 | Discharge: 2022-06-17 | Disposition: A | Discharge status: Home / Self Care | Payer: Medicare Other | Source: Ambulatory Visit | Attending: Obstetrics and Gynecology | Admitting: Obstetrics and Gynecology

## 2022-06-17 DIAGNOSIS — Z1231 Encounter for screening mammogram for malignant neoplasm of breast: Secondary | ICD-10-CM

## 2022-07-14 ENCOUNTER — Ambulatory Visit: Payer: Medicare Other | Admitting: Allergy and Immunology

## 2022-09-29 ENCOUNTER — Telehealth: Payer: Self-pay | Admitting: Allergy and Immunology

## 2022-09-29 MED ORDER — FLUCONAZOLE 150 MG PO TABS: 150.0000 mg | ORAL_TABLET | Freq: Every day | ORAL | 0 refills | Status: DC

## 2022-09-29 MED ORDER — EPINEPHRINE 0.3 MG/0.3ML IJ SOAJ: INTRAMUSCULAR | 0 refills | Status: DC

## 2022-09-29 NOTE — Telephone Encounter (Signed)
Medications have been sent to requested pharmacies. Advised patient that only 30 day supply was being sent and that she needed to make an appointment for further refills.

## 2022-09-29 NOTE — Telephone Encounter (Signed)
Patient  called and needs to have epi-pen and D[facility]lucan [facility] [address]ryne [address]per . 90 day supply  [phone]

## 2022-09-30 ENCOUNTER — Encounter: Payer: Self-pay | Admitting: Emergency Medicine

## 2022-10-05 ENCOUNTER — Encounter: Payer: Self-pay | Admitting: Registered Nurse

## 2022-11-03 ENCOUNTER — Other Ambulatory Visit: Payer: Self-pay | Admitting: *Deleted

## 2022-11-03 DIAGNOSIS — M8580 Other specified disorders of bone density and structure, unspecified site: Secondary | ICD-10-CM

## 2022-11-25 ENCOUNTER — Other Ambulatory Visit: Payer: Self-pay | Admitting: Allergy and Immunology

## 2023-01-07 ENCOUNTER — Ambulatory Visit
Admission: RE | Admit: 2023-01-07 | Discharge: 2023-01-07 | Disposition: A | Discharge status: Home / Self Care | Payer: Medicare Other | Source: Ambulatory Visit | Attending: Family Medicine | Admitting: Family Medicine

## 2023-01-07 DIAGNOSIS — M8580 Other specified disorders of bone density and structure, unspecified site: Secondary | ICD-10-CM

## 2023-02-04 ENCOUNTER — Telehealth: Payer: Self-pay

## 2023-02-04 MED ORDER — DEXLANSOPRAZOLE 60 MG PO CPDR: 1.0000 | DELAYED_RELEASE_CAPSULE | Freq: Every morning | ORAL | 0 refills | Status: DC

## 2023-02-04 NOTE — Telephone Encounter (Signed)
Sent in curtesy refill to [facility] of dexilant

## 2023-02-04 NOTE — Telephone Encounter (Signed)
Patient called requesting a refill on Dexilant. Patient has been scheduled for a follow up visit on 02/16/2023 with Dr. Lucie Leather.   [facility]

## 2023-02-16 ENCOUNTER — Other Ambulatory Visit: Payer: Self-pay

## 2023-02-16 ENCOUNTER — Encounter: Payer: Self-pay | Admitting: Allergy and Immunology

## 2023-02-16 ENCOUNTER — Ambulatory Visit (INDEPENDENT_AMBULATORY_CARE_PROVIDER_SITE_OTHER): Payer: Medicare Other | Admitting: Allergy and Immunology

## 2023-02-16 VITALS — BP 142/90 | HR 73 | Temp 98.6°F | Resp 16 | Ht 62.0 in | Wt 131.4 lb

## 2023-02-16 DIAGNOSIS — K219 Gastro-esophageal reflux disease without esophagitis: Secondary | ICD-10-CM

## 2023-02-16 DIAGNOSIS — J3089 Other allergic rhinitis: Secondary | ICD-10-CM

## 2023-02-16 DIAGNOSIS — J4540 Moderate persistent asthma, uncomplicated: Secondary | ICD-10-CM

## 2023-02-16 DIAGNOSIS — B370 Candidal stomatitis: Secondary | ICD-10-CM | POA: Diagnosis not present

## 2023-02-16 MED ORDER — OMEPRAZOLE 40 MG PO CPDR: DELAYED_RELEASE_CAPSULE | ORAL | 5 refills | Status: DC

## 2023-02-16 MED ORDER — EPINEPHRINE 0.3 MG/0.3ML IJ SOAJ: INTRAMUSCULAR | 1 refills | Status: DC

## 2023-02-16 MED ORDER — QNASL 80 MCG/ACT NA AERS: 2.0000 | INHALATION_SPRAY | Freq: Every day | NASAL | 1 refills | Status: DC

## 2023-02-16 MED ORDER — CETIRIZINE HCL 10 MG PO TABS: 10.0000 mg | ORAL_TABLET | Freq: Two times a day (BID) | ORAL | 1 refills | Status: DC | PRN

## 2023-02-16 MED ORDER — MONTELUKAST SODIUM 10 MG PO TABS: 10.0000 mg | ORAL_TABLET | Freq: Every day | ORAL | 1 refills | Status: DC

## 2023-02-16 MED ORDER — FAMOTIDINE 40 MG PO TABS: 40.0000 mg | ORAL_TABLET | Freq: Every evening | ORAL | 1 refills | Status: DC

## 2023-02-16 NOTE — Patient Instructions (Addendum)
  1. Treat and prevent inflammation:  A. Dupilumab every 2 weeks B. Montelukast 10mg  daily C. Qnasl 80 - 1-2 puffs 3-7 times per week  2.  Treat and prevent reflux/LPR:   A. Dexilant 60mg  - 1 tablet in AM  B. Omeprazole 40 mg - 1 tablet late afternoon C. Famotidine 40 mg - 1 tablet in PM   3. Treat and prevent recurrent thrush:   A. Diflucan 150 mg - 1 tablet 1 time per month  4. If needed:  A. Cetirizine  B. Albuterol MDI  C. Nasal saline  5. Return to clinic in 6 months or earlier if problem  6. Obtain fall flu vaccine

## 2023-02-16 NOTE — Progress Notes (Unsigned)
[place] [facility] [place] - [place] - [place]   Follow-up Note  Referring Provider: Donzetta Sprung, NP Primary Provider: Donzetta Sprung, NP Date of Office Visit: 02/16/2023  Subjective:   [name] (DOB: 1950/10/14) is a 73 y.o. female who [facility] in re-evaluation of the following:  HPI: [name] returns to this clinic in evaluation of asthma, allergic rhinitis, history of chronic sinusitis, LPR.  I last saw her in this clinic 03 February 2022.  She has really done well with her asthma and has not required a systemic steroid to treat an exacerbationand her use of a short acting bronchodilator is almost 0 while she continues on dupilumab and montelukast.  She has had very little problems with her nose and she uses her Qnasl a few times a week if that and she has not required an antibiotic to treat an episode of sinusitis.  Her reflux is okay while using a very aggressive therapy which includes Dexilant, omeprazole, famotidine.  Her history of recurrent thrush is under excellent control while using Diflucan just 1 time per month.  Allergies as of 02/16/2023       Reactions   Sulfa Antibiotics Itching   Amoxicillin-pot Clavulanate Nausea And Vomiting   Doxycycline Nausea And Vomiting   Nitrofurantoin Macrocrystal Other (See Comments)   Edema Edema   Pregabalin Other (See Comments)   Blurry vision Blurry vision        Medication List    acetaminophen 500 MG tablet Commonly known as: TYLENOL Take 1,000 mg by mouth every 6 (six) hours as needed for pain. Reported on 05/15/2015   ALPRAZolam 0.5 MG tablet Commonly known as: XANAX Take 0.5 mg by mouth at bedtime as needed for sleep.   aspirin EC 81 MG tablet Take 1 tablet (81 mg total) by mouth daily.   atorvastatin 20 MG tablet Commonly known as: LIPITOR Take 1 tablet (20 mg total) by mouth daily at 6 PM.   Breztri Aerosphere 160-9-4.8 MCG/ACT  Aero Generic drug: Budeson-Glycopyrrol-Formoterol 2 inhalations 2 times per day   brimonidine 0.2 % ophthalmic solution Commonly known as: ALPHAGAN Place 1 drop into both eyes every morning.   cetirizine 10 MG tablet Commonly known as: ZYRTEC Take 1 tablet (10 mg total) by mouth 2 (two) times daily as needed for allergies (Can take an extra dose during flare ups.).   cyanocobalamin 1000 MCG tablet Commonly known as: VITAMIN B12 Take 1 tablet by mouth daily.   dexlansoprazole 60 MG capsule Commonly known as: Dexilant Take 1 capsule (60 mg total) by mouth in the morning.   Dulera 200-5 MCG/ACT Aero Generic drug: mometasone-formoterol Inhale 2 puffs into the lungs 2 (two) times daily.   Dupixent 300 MG/2ML prefilled syringe Generic drug: dupilumab Inject 300 mg into the skin every 14 (fourteen) days.   EPINEPHrine 0.3 mg/0.3 mL Soaj injection Commonly known as: EpiPen 2-Pak Use for life threatening allergic reaction   escitalopram 20 MG tablet Commonly known as: LEXAPRO Take 20 mg by mouth daily.   famotidine 40 MG tablet Commonly known as: PEPCID Take 1 tablet (40 mg total) by mouth at bedtime.   fluconazole 150 MG tablet Commonly known as: Diflucan Take 1 tablet (150 mg total) by mouth daily.   gabapentin 100 MG capsule Commonly known as: NEURONTIN Take 1 capsule by mouth as needed.   latanoprost 0.005 % ophthalmic solution Commonly known as: XALATAN Place 1 drop into both eyes every morning.  montelukast 10 MG tablet Commonly known as: SINGULAIR Take 1 tablet (10 mg total) by mouth at bedtime.   omeprazole 40 MG capsule Commonly known as: PRILOSEC 1 tablet by mouth late afternoon.   ProAir HFA 108 (90 Base) MCG/ACT inhaler Generic drug: albuterol INHALE TWO PUFFS BY MOUTH EVERY 6 HOURS AS NEEDED FOR WHEEZING OR SHORTNESS OF BREATH   Qnasl 80 MCG/ACT Aers Generic drug: Beclomethasone Dipropionate Inhale 2 puffs into the lungs daily.   Semaglutide (1  MG/DOSE) 2 MG/1.5ML Sopn Inject 1 mg into the skin once a week.   thyroid 15 MG tablet Commonly known as: ARMOUR Take 1 tablet by mouth daily.    Past Medical History:  Diagnosis Date   Allergy history unknown    Asthma    Breast cancer (HCC) 03/24/2011   left   Bronchitis    Dizziness    GERD (gastroesophageal reflux disease)    Glaucoma    Headache(784.0)    Personal history of radiation therapy 2011   Pneumonia    hx of   PONV (postoperative nausea and vomiting)    Shortness of breath    asthma   Sinusitis     Past Surgical History:  Procedure Laterality Date   BREAST LUMPECTOMY Left 08-2009   left    EYE SURGERY Bilateral    laser surgery - glaucoma   eyelid surgery Bilateral    for glaucoma   NOSE SURGERY  12-2006   OOPHORECTOMY  2001   SINUS ENDO W/FUSION Bilateral 02/16/2013   Procedure: ENDOSCOPIC SINUS SURGERY WITH FUSION NAVIGATION;  Surgeon: Osborn Coho, MD;  Location: [facility] OR;  Service: ENT;  Laterality: Bilateral;    Review of systems negative except as noted in HPI / PMHx or noted below:  Review of Systems  Constitutional: Negative.   HENT: Negative.    Eyes: Negative.   Respiratory: Negative.    Cardiovascular: Negative.   Gastrointestinal: Negative.   Genitourinary: Negative.   Musculoskeletal: Negative.   Skin: Negative.   Neurological: Negative.   Endo/Heme/Allergies: Negative.   Psychiatric/Behavioral: Negative.       Objective:   Vitals:   02/16/23 1335  BP: (!) 142/90  Pulse: 73  Resp: 16  Temp: 98.6 F (37 C)  SpO2: 97%   Height: 5\' 2"  (157.5 cm)  Weight: 131 lb 6.4 oz (59.6 kg)   Physical Exam Constitutional:      Appearance: She is not diaphoretic.  HENT:     Head: Normocephalic.     Right Ear: Tympanic membrane, ear canal and external ear normal.     Left Ear: Tympanic membrane, ear canal and external ear normal.     Nose: Nose normal. No mucosal edema or rhinorrhea.     Mouth/Throat:     Pharynx: Uvula midline.  No oropharyngeal exudate.  Eyes:     Conjunctiva/sclera: Conjunctivae normal.  Neck:     Thyroid: No thyromegaly.     Trachea: Trachea normal. No tracheal tenderness or tracheal deviation.  Cardiovascular:     Rate and Rhythm: Normal rate and regular rhythm.     Heart sounds: Normal heart sounds, S1 normal and S2 normal. No murmur heard. Pulmonary:     Effort: No respiratory distress.     Breath sounds: Normal breath sounds. No stridor. No wheezing or rales.  Lymphadenopathy:     Head:     Right side of head: No tonsillar adenopathy.     Left side of head: No tonsillar adenopathy.  Cervical: No cervical adenopathy.  Skin:    Findings: No erythema or rash.     Nails: There is no clubbing.  Neurological:     Mental Status: She is alert.     Diagnostics:    Spirometry was performed and demonstrated an FEV1 of 2.0 at 98 % of predicted.  Results of a chest x-ray obtained 05 May 2022 identified the following:  The heart size and mediastinal contours are within normal limits. Both lungs are clear. No discernible pulmonary nodules or masses. The visualized skeletal structures are unremarkable.  Assessment and Plan:   1. Asthma, moderate persistent, well-controlled   2. Other allergic rhinitis   3. LPRD (laryngopharyngeal reflux disease)   4. Thrush    1. Treat and prevent inflammation:  A. Dupilumab every 2 weeks B. Montelukast 10mg  daily C. Qnasl 80 - 1-2 puffs 3-7 times per week  2.  Treat and prevent reflux/LPR:   A. Dexilant 60mg  - 1 tablet in AM  B. Omeprazole 40 mg - 1 tablet late afternoon C. Famotidine 40 mg - 1 tablet in PM   3. Treat and prevent recurrent thrush:   A. Diflucan 150 mg - 1 tablet 1 time per month  4. If needed:  A. Cetirizine  B. Albuterol MDI  C. Nasal saline  5. Return to clinic in 6 months or earlier if problem  6. Obtain fall flu vaccine   Sansa is really doing very well while utilizing a collection of agents directed  against respiratory track inflammation and reflux as noted above which includes the use of an anti-IL-4/13 biologic agent.  And her recurrent thrush is under good control with the use of the Diflucan once a month.  She will remain on the plan noted above and I will see her back in this clinic in 6 months or earlier if there is a problem.   Laurette Schimke, MD [facility]ergy / [facility]

## 2023-02-17 ENCOUNTER — Encounter: Payer: Self-pay | Admitting: Allergy and Immunology

## 2023-02-25 ENCOUNTER — Other Ambulatory Visit: Payer: Self-pay

## 2023-02-25 MED ORDER — QNASL 80 MCG/ACT NA AERS: INHALATION_SPRAY | NASAL | 1 refills | Status: DC

## 2023-05-14 ENCOUNTER — Other Ambulatory Visit: Payer: Self-pay | Admitting: Allergy and Immunology

## 2023-05-17 NOTE — Telephone Encounter (Signed)
[name] called in and states she needs a refill of Dexilant and [facility] [place].  Her husband just recently passed away over Christmas Holiday and she didn't realize she was completely out.

## 2023-07-13 ENCOUNTER — Other Ambulatory Visit: Payer: Self-pay | Admitting: Obstetrics and Gynecology

## 2023-07-13 DIAGNOSIS — Z1231 Encounter for screening mammogram for malignant neoplasm of breast: Secondary | ICD-10-CM

## 2023-07-20 ENCOUNTER — Ambulatory Visit
Admission: RE | Admit: 2023-07-20 | Discharge: 2023-07-20 | Disposition: A | Discharge status: Home / Self Care | Payer: Medicare Other | Source: Ambulatory Visit | Attending: Obstetrics and Gynecology

## 2023-07-20 DIAGNOSIS — Z1231 Encounter for screening mammogram for malignant neoplasm of breast: Secondary | ICD-10-CM

## 2023-08-17 ENCOUNTER — Other Ambulatory Visit: Payer: Self-pay

## 2023-08-17 ENCOUNTER — Ambulatory Visit (INDEPENDENT_AMBULATORY_CARE_PROVIDER_SITE_OTHER): Payer: Medicare Other | Admitting: Allergy and Immunology

## 2023-08-17 ENCOUNTER — Encounter: Payer: Self-pay | Admitting: Allergy and Immunology

## 2023-08-17 VITALS — BP 128/78 | HR 78 | Temp 98.1°F | Resp 16 | Ht 62.0 in | Wt 120.9 lb

## 2023-08-17 DIAGNOSIS — J3089 Other allergic rhinitis: Secondary | ICD-10-CM

## 2023-08-17 DIAGNOSIS — J4540 Moderate persistent asthma, uncomplicated: Secondary | ICD-10-CM

## 2023-08-17 DIAGNOSIS — K219 Gastro-esophageal reflux disease without esophagitis: Secondary | ICD-10-CM | POA: Diagnosis not present

## 2023-08-17 DIAGNOSIS — B370 Candidal stomatitis: Secondary | ICD-10-CM

## 2023-08-17 MED ORDER — CETIRIZINE HCL 10 MG PO TABS: 10.0000 mg | ORAL_TABLET | Freq: Every day | ORAL | 1 refills | Status: AC | PRN

## 2023-08-17 MED ORDER — OMEPRAZOLE 40 MG PO CPDR: DELAYED_RELEASE_CAPSULE | ORAL | 1 refills | Status: DC

## 2023-08-17 MED ORDER — DEXLANSOPRAZOLE 60 MG PO CPDR: 1.0000 | DELAYED_RELEASE_CAPSULE | Freq: Every morning | ORAL | 1 refills | Status: AC

## 2023-08-17 MED ORDER — ALBUTEROL SULFATE HFA 108 (90 BASE) MCG/ACT IN AERS
1.0000 | INHALATION_SPRAY | Freq: Four times a day (QID) | RESPIRATORY_TRACT | 1 refills | Status: AC | PRN
Start: 2023-08-17 — End: ?

## 2023-08-17 MED ORDER — EPINEPHRINE 0.3 MG/0.3ML IJ SOAJ: INTRAMUSCULAR | 1 refills | Status: AC

## 2023-08-17 MED ORDER — FAMOTIDINE 40 MG PO TABS: 40.0000 mg | ORAL_TABLET | Freq: Every evening | ORAL | 1 refills | Status: AC

## 2023-08-17 MED ORDER — QNASL 80 MCG/ACT NA AERS
INHALATION_SPRAY | NASAL | 1 refills | Status: AC
Start: 2023-08-17 — End: ?

## 2023-08-17 MED ORDER — FLUCONAZOLE 150 MG PO TABS: 150.0000 mg | ORAL_TABLET | Freq: Once | ORAL | 6 refills | Status: AC

## 2023-08-17 MED ORDER — MONTELUKAST SODIUM 10 MG PO TABS: 10.0000 mg | ORAL_TABLET | Freq: Every day | ORAL | 1 refills | Status: AC

## 2023-08-17 NOTE — Patient Instructions (Signed)
  1. Treat and prevent inflammation:  A. Dupilumab every 2 weeks B. Montelukast 10mg  daily C. Qnasl 80 - 1-2 puffs 3-7 times per week  2.  Treat and prevent reflux/LPR:   A. Dexilant 60mg  - 1 tablet in AM  B. Omeprazole 40 mg - 1 tablet late afternoon C. Famotidine 40 mg - 1 tablet in PM   3. Treat and prevent recurrent thrush:   A. Diflucan 150 mg - 1 tablet 1 time per month  4. If needed:  A. Cetirizine  B. Albuterol MDI  C. Nasal saline  5. Return to clinic in 6 months or earlier if problem  6. Influenza = Tamiflu. Covid = Paxlovid

## 2023-08-17 NOTE — Progress Notes (Unsigned)
 [place] [facility] [place] - [place] - [place] - [place]   Follow-up Note  Referring Provider: Donzetta Sprung, NP Primary Provider: Donzetta Sprung, NP Date of Office Visit: 08/17/2023  Subjective:   [name] (DOB: 1951-02-09) is a 73 y.o. female who [facility] in re-evaluation of the following:  HPI: [name] returns to this clinic in evaluation of asthma, allergic rhinitis, history of chronic sinusitis, LPR.  I last saw her in this clinic 16 February 2023.  She has had an excellent control of her asthma and rarely uses a short acting bronchodilator and can exert herself without any problem of cold air exposure without any problem and has not required a systemic steroid to treat an exacerbation while she continues on dupilumab injections.  She has had very little problems with her upper airways while she continues on a nasal steroid and montelukast.  She has not required an antibiotic treat an episode of sinusitis.  Her reflux is manageable while using a very large collection of therapy directed against this disease state including a proton pump inhibitor in the morning, proton pump inhibitor in the afternoon, and H2 receptor blocker in the evening.  She does not really consume much caffeine or chocolate.  She had a history of recurrent thrush and this is under excellent control while she uses the Diflucan about 1 time per month.  Recently she did have to use an antibiotic for UTI and that gave rise to an episode of thrush that was addressed by her dentist.  Allergies as of 08/17/2023       Reactions   Sulfa Antibiotics Itching   Amoxicillin-pot Clavulanate Nausea And Vomiting   Doxycycline Nausea And Vomiting   Nitrofurantoin Macrocrystal Other (See Comments)   Edema Edema   Pregabalin Other (See Comments)   Blurry vision Blurry vision        Medication List    acetaminophen 500 MG tablet Commonly known as:  TYLENOL Take 1,000 mg by mouth every 6 (six) hours as needed for pain. Reported on 05/15/2015   ALPRAZolam 0.5 MG tablet Commonly known as: XANAX Take 0.5 mg by mouth at bedtime as needed for sleep.   aspirin EC 81 MG tablet Take 1 tablet (81 mg total) by mouth daily.   atorvastatin 20 MG tablet Commonly known as: LIPITOR Take 1 tablet (20 mg total) by mouth daily at 6 PM.   Breztri Aerosphere 160-9-4.8 MCG/ACT Aero Generic drug: budeson-glycopyrrolate-formoterol 2 inhalations 2 times per day   brimonidine 0.2 % ophthalmic solution Commonly known as: ALPHAGAN Place 1 drop into both eyes every morning.   cetirizine 10 MG tablet Commonly known as: ZYRTEC Take 1 tablet (10 mg total) by mouth 2 (two) times daily as needed for allergies (Can take an extra dose during flare ups.).   cyanocobalamin 1000 MCG tablet Commonly known as: VITAMIN B12 Take 1 tablet by mouth daily.   dexlansoprazole 60 MG capsule Commonly known as: DEXILANT TAKE 1 CAPSULE (60 MG TOTAL) BY MOUTH IN THE MORNING   Dulera 200-5 MCG/ACT Aero Generic drug: mometasone-formoterol Inhale 2 puffs into the lungs 2 (two) times daily.   Dupixent 300 MG/2ML prefilled syringe Generic drug: dupilumab INJECT 300MG  (1 SYRINGE) SUBCUTANEOUSLY EVERY 14 DAYS - (BRING TO ROOM TEMPERATURE BEFORE USE *DO NOT SHAKE*)   EPINEPHrine 0.3 mg/0.3 mL Soaj injection Commonly known as: EpiPen 2-Pak Use for life threatening allergic reaction   escitalopram 20 MG tablet Commonly known as: LEXAPRO  Take 20 mg by mouth daily.   famotidine 40 MG tablet Commonly known as: PEPCID Take 1 tablet (40 mg total) by mouth at bedtime.   fluconazole 150 MG tablet Commonly known as: Diflucan Take 1 tablet (150 mg total) by mouth daily.   gabapentin 100 MG capsule Commonly known as: NEURONTIN Take 1 capsule by mouth as needed.   latanoprost 0.005 % ophthalmic solution Commonly known as: XALATAN Place 1 drop into both eyes every  morning.   montelukast 10 MG tablet Commonly known as: SINGULAIR Take 1 tablet (10 mg total) by mouth at bedtime.   omeprazole 40 MG capsule Commonly known as: PRILOSEC 1 tablet by mouth late afternoon.   ProAir HFA 108 (90 Base) MCG/ACT inhaler Generic drug: albuterol INHALE TWO PUFFS BY MOUTH EVERY 6 HOURS AS NEEDED FOR WHEEZING OR SHORTNESS OF BREATH   Qnasl 80 MCG/ACT Aers Generic drug: Beclomethasone Dipropionate 1-2 sprays per nostril 1 (ONE) time daily as needed for allergies.   Semaglutide (1 MG/DOSE) 2 MG/1.5ML Sopn Inject 1 mg into the skin once a week.   thyroid 15 MG tablet Commonly known as: ARMOUR Take 1 tablet by mouth daily.    Past Medical History:  Diagnosis Date   Allergy history unknown    Asthma    Breast cancer (HCC) 03/24/2011   left   Bronchitis    Dizziness    GERD (gastroesophageal reflux disease)    Glaucoma    Headache(784.0)    Personal history of radiation therapy 2011   Pneumonia    hx of   PONV (postoperative nausea and vomiting)    Shortness of breath    asthma   Sinusitis     Past Surgical History:  Procedure Laterality Date   BREAST LUMPECTOMY Left 08-2009   left    EYE SURGERY Bilateral    laser surgery - glaucoma   eyelid surgery Bilateral    for glaucoma   NOSE SURGERY  12-2006   OOPHORECTOMY  2001   SINUS ENDO W/FUSION Bilateral 02/16/2013   Procedure: ENDOSCOPIC SINUS SURGERY WITH FUSION NAVIGATION;  Surgeon: Osborn Coho, MD;  Location[facility] OR;  Service: ENT;  Laterality: Bilateral;    Review of systems negative except as noted in HPI / PMHx or noted below:  Review of Systems  Constitutional: Negative.   HENT: Negative.    Eyes: Negative.   Respiratory: Negative.    Cardiovascular: Negative.   Gastrointestinal: Negative.   Genitourinary: Negative.   Musculoskeletal: Negative.   Skin: Negative.   Neurological: Negative.   Endo/Heme/Allergies: Negative.   Psychiatric/Behavioral: Negative.        Objective:   Vitals:   08/17/23 1340  BP: 128/78  Pulse: 78  Resp: 16  Temp: 98.1 F (36.7 C)  SpO2: 96%   Height: 5\' 2"  (157.5 cm)  Weight: 120 lb 14.4 oz (54.8 kg)   Physical Exam Constitutional:      Appearance: She is not diaphoretic.  HENT:     Head: Normocephalic.     Right Ear: Tympanic membrane, ear canal and external ear normal.     Left Ear: Tympanic membrane, ear canal and external ear normal.     Nose: Nose normal. No mucosal edema or rhinorrhea.     Mouth/Throat:     Pharynx: Uvula midline. No oropharyngeal exudate.  Eyes:     Conjunctiva/sclera: Conjunctivae normal.  Neck:     Thyroid: No thyromegaly.     Trachea: Trachea normal. No tracheal tenderness or tracheal deviation.  Cardiovascular:     Rate and Rhythm: Normal rate and regular rhythm.     Heart sounds: Normal heart sounds, S1 normal and S2 normal. No murmur heard. Pulmonary:     Effort: No respiratory distress.     Breath sounds: Normal breath sounds. No stridor. No wheezing or rales.  Lymphadenopathy:     Head:     Right side of head: No tonsillar adenopathy.     Left side of head: No tonsillar adenopathy.     Cervical: No cervical adenopathy.  Skin:    Findings: No erythema or rash.     Nails: There is no clubbing.  Neurological:     Mental Status: She is alert.     Diagnostics: Spirometry was performed and demonstrated an FEV1 of 215 at 106 % of predicted.  Assessment and Plan:   1. Asthma, moderate persistent, well-controlled   2. Other allergic rhinitis   3. LPRD (laryngopharyngeal reflux disease)   4. Thrush    1. Treat and prevent inflammation:  A. Dupilumab every 2 weeks B. Montelukast 10mg  daily C. Qnasl 80 - 1-2 puffs 3-7 times per week  2.  Treat and prevent reflux/LPR:   A. Dexilant 60mg  - 1 tablet in AM  B. Omeprazole 40 mg - 1 tablet late afternoon C. Famotidine 40 mg - 1 tablet in PM   3. Treat and prevent recurrent thrush:   A. Diflucan 150 mg - 1  tablet 1 time per month  4. If needed:  A. Cetirizine  B. Albuterol MDI  C. Nasal saline  5. Return to clinic in 6 months or earlier if problem  6. Influenza = Tamiflu. Covid = Paxlovid  Hadar is really doing very well regarding her airway issue on her current plan of therapy which includes anti-IL-4/13 biologic agent and she will remain on this plan and I will see her back in this clinic in 6 months.  Concerning her reflux, this also appears to be okay on her current plan although certainly not 100% controlled.  She is already seen a GI in the past to address this issue.  She will remain on the plan noted above.  Her history of recurrent thrush seems to be managed very well by Diflucan every month.   Laurette Schimke, MD Allergy / Immunology [facility]

## 2023-08-18 ENCOUNTER — Encounter: Payer: Self-pay | Admitting: Allergy and Immunology

## 2023-08-20 ENCOUNTER — Other Ambulatory Visit: Payer: Self-pay | Admitting: Allergy and Immunology

## 2024-02-22 ENCOUNTER — Ambulatory Visit: Admitting: Allergy and Immunology
